# Patient Record
Sex: Female | Born: 1940
Health system: Southern US, Community
[De-identification: ages and names within clinical notes are randomized; demographics above are authoritative.]

## PROBLEM LIST (undated history)

## (undated) DIAGNOSIS — I10 Essential (primary) hypertension: Secondary | ICD-10-CM

## (undated) DIAGNOSIS — M1990 Unspecified osteoarthritis, unspecified site: Secondary | ICD-10-CM

## (undated) DIAGNOSIS — C801 Malignant (primary) neoplasm, unspecified: Secondary | ICD-10-CM

## (undated) HISTORY — DX: Essential (primary) hypertension: I10

## (undated) HISTORY — DX: Malignant (primary) neoplasm, unspecified: C80.1

## (undated) HISTORY — PX: OTHER SURGICAL HISTORY: SHX169

## (undated) HISTORY — PX: BACK SURGERY: SHX140

## (undated) HISTORY — DX: Unspecified osteoarthritis, unspecified site: M19.90

## (undated) HISTORY — PX: BREAST RECONSTRUCTION: SHX9

---

## 1998-12-01 ENCOUNTER — Other Ambulatory Visit: Admission: RE | Admit: 1998-12-01 | Discharge: 1998-12-01 | Payer: Self-pay | Admitting: Internal Medicine

## 1999-12-03 ENCOUNTER — Other Ambulatory Visit: Admission: RE | Admit: 1999-12-03 | Discharge: 1999-12-03 | Payer: Self-pay | Admitting: Internal Medicine

## 2000-10-29 ENCOUNTER — Ambulatory Visit (HOSPITAL_BASED_OUTPATIENT_CLINIC_OR_DEPARTMENT_OTHER): Admission: RE | Admit: 2000-10-29 | Discharge: 2000-10-29 | Payer: Self-pay | Admitting: Plastic Surgery

## 2000-12-02 ENCOUNTER — Other Ambulatory Visit: Admission: RE | Admit: 2000-12-02 | Discharge: 2000-12-02 | Payer: Self-pay | Admitting: Internal Medicine

## 2002-01-26 ENCOUNTER — Ambulatory Visit (HOSPITAL_COMMUNITY): Admission: RE | Admit: 2002-01-26 | Discharge: 2002-01-26 | Payer: Self-pay | Admitting: Gastroenterology

## 2010-12-29 ENCOUNTER — Other Ambulatory Visit: Payer: Self-pay | Admitting: Dermatology

## 2011-02-22 ENCOUNTER — Other Ambulatory Visit (HOSPITAL_COMMUNITY): Payer: Self-pay | Admitting: Rheumatology

## 2011-02-22 ENCOUNTER — Ambulatory Visit (HOSPITAL_COMMUNITY)
Admission: RE | Admit: 2011-02-22 | Discharge: 2011-02-22 | Disposition: A | Discharge status: Home / Self Care | Payer: Medicare Other | Source: Ambulatory Visit | Attending: Rheumatology | Admitting: Rheumatology

## 2011-02-22 DIAGNOSIS — C50919 Malignant neoplasm of unspecified site of unspecified female breast: Secondary | ICD-10-CM | POA: Insufficient documentation

## 2011-02-22 DIAGNOSIS — R52 Pain, unspecified: Secondary | ICD-10-CM

## 2011-02-22 DIAGNOSIS — Z01818 Encounter for other preprocedural examination: Secondary | ICD-10-CM | POA: Insufficient documentation

## 2011-10-16 DIAGNOSIS — Z79899 Other long term (current) drug therapy: Secondary | ICD-10-CM | POA: Diagnosis not present

## 2011-12-05 DIAGNOSIS — M069 Rheumatoid arthritis, unspecified: Secondary | ICD-10-CM | POA: Diagnosis not present

## 2011-12-05 DIAGNOSIS — M19049 Primary osteoarthritis, unspecified hand: Secondary | ICD-10-CM | POA: Diagnosis not present

## 2011-12-05 DIAGNOSIS — M25549 Pain in joints of unspecified hand: Secondary | ICD-10-CM | POA: Diagnosis not present

## 2011-12-10 DIAGNOSIS — H25019 Cortical age-related cataract, unspecified eye: Secondary | ICD-10-CM | POA: Diagnosis not present

## 2011-12-10 DIAGNOSIS — H52209 Unspecified astigmatism, unspecified eye: Secondary | ICD-10-CM | POA: Diagnosis not present

## 2011-12-10 DIAGNOSIS — Z79899 Other long term (current) drug therapy: Secondary | ICD-10-CM | POA: Diagnosis not present

## 2012-01-18 DIAGNOSIS — Z79899 Other long term (current) drug therapy: Secondary | ICD-10-CM | POA: Diagnosis not present

## 2012-01-31 DIAGNOSIS — IMO0002 Reserved for concepts with insufficient information to code with codable children: Secondary | ICD-10-CM | POA: Diagnosis not present

## 2012-01-31 DIAGNOSIS — H52209 Unspecified astigmatism, unspecified eye: Secondary | ICD-10-CM | POA: Diagnosis not present

## 2012-01-31 DIAGNOSIS — H269 Unspecified cataract: Secondary | ICD-10-CM | POA: Diagnosis not present

## 2012-01-31 DIAGNOSIS — H25019 Cortical age-related cataract, unspecified eye: Secondary | ICD-10-CM | POA: Diagnosis not present

## 2012-02-07 DIAGNOSIS — H4389 Other disorders of vitreous body: Secondary | ICD-10-CM | POA: Diagnosis not present

## 2012-02-29 DIAGNOSIS — F411 Generalized anxiety disorder: Secondary | ICD-10-CM | POA: Diagnosis not present

## 2012-03-04 DIAGNOSIS — H43829 Vitreomacular adhesion, unspecified eye: Secondary | ICD-10-CM | POA: Diagnosis not present

## 2012-03-11 DIAGNOSIS — H43829 Vitreomacular adhesion, unspecified eye: Secondary | ICD-10-CM | POA: Diagnosis not present

## 2012-03-19 DIAGNOSIS — Z79899 Other long term (current) drug therapy: Secondary | ICD-10-CM | POA: Diagnosis not present

## 2012-04-03 DIAGNOSIS — H43829 Vitreomacular adhesion, unspecified eye: Secondary | ICD-10-CM | POA: Diagnosis not present

## 2012-05-07 DIAGNOSIS — M25549 Pain in joints of unspecified hand: Secondary | ICD-10-CM | POA: Diagnosis not present

## 2012-05-07 DIAGNOSIS — M19049 Primary osteoarthritis, unspecified hand: Secondary | ICD-10-CM | POA: Diagnosis not present

## 2012-05-07 DIAGNOSIS — M069 Rheumatoid arthritis, unspecified: Secondary | ICD-10-CM | POA: Diagnosis not present

## 2012-05-07 DIAGNOSIS — M25569 Pain in unspecified knee: Secondary | ICD-10-CM | POA: Diagnosis not present

## 2012-06-25 DIAGNOSIS — Z1211 Encounter for screening for malignant neoplasm of colon: Secondary | ICD-10-CM | POA: Diagnosis not present

## 2012-08-01 DIAGNOSIS — E039 Hypothyroidism, unspecified: Secondary | ICD-10-CM | POA: Diagnosis not present

## 2012-08-01 DIAGNOSIS — M949 Disorder of cartilage, unspecified: Secondary | ICD-10-CM | POA: Diagnosis not present

## 2012-08-01 DIAGNOSIS — M899 Disorder of bone, unspecified: Secondary | ICD-10-CM | POA: Diagnosis not present

## 2012-08-01 DIAGNOSIS — E785 Hyperlipidemia, unspecified: Secondary | ICD-10-CM | POA: Diagnosis not present

## 2012-08-01 DIAGNOSIS — Z79899 Other long term (current) drug therapy: Secondary | ICD-10-CM | POA: Diagnosis not present

## 2012-08-08 DIAGNOSIS — Z124 Encounter for screening for malignant neoplasm of cervix: Secondary | ICD-10-CM | POA: Diagnosis not present

## 2012-08-08 DIAGNOSIS — Z23 Encounter for immunization: Secondary | ICD-10-CM | POA: Diagnosis not present

## 2012-08-08 DIAGNOSIS — M069 Rheumatoid arthritis, unspecified: Secondary | ICD-10-CM | POA: Diagnosis not present

## 2012-08-08 DIAGNOSIS — E039 Hypothyroidism, unspecified: Secondary | ICD-10-CM | POA: Diagnosis not present

## 2012-08-08 DIAGNOSIS — E785 Hyperlipidemia, unspecified: Secondary | ICD-10-CM | POA: Diagnosis not present

## 2012-08-08 DIAGNOSIS — Z79899 Other long term (current) drug therapy: Secondary | ICD-10-CM | POA: Diagnosis not present

## 2012-08-08 DIAGNOSIS — Z0000 Encounter for general adult medical examination without abnormal findings: Secondary | ICD-10-CM | POA: Diagnosis not present

## 2012-08-18 DIAGNOSIS — Z1212 Encounter for screening for malignant neoplasm of rectum: Secondary | ICD-10-CM | POA: Diagnosis not present

## 2012-09-01 DIAGNOSIS — H4389 Other disorders of vitreous body: Secondary | ICD-10-CM | POA: Diagnosis not present

## 2012-09-08 DIAGNOSIS — E039 Hypothyroidism, unspecified: Secondary | ICD-10-CM | POA: Diagnosis not present

## 2012-09-11 DIAGNOSIS — Z79899 Other long term (current) drug therapy: Secondary | ICD-10-CM | POA: Diagnosis not present

## 2012-10-13 DIAGNOSIS — M19049 Primary osteoarthritis, unspecified hand: Secondary | ICD-10-CM | POA: Diagnosis not present

## 2012-10-13 DIAGNOSIS — M1710 Unilateral primary osteoarthritis, unspecified knee: Secondary | ICD-10-CM | POA: Diagnosis not present

## 2012-10-13 DIAGNOSIS — M069 Rheumatoid arthritis, unspecified: Secondary | ICD-10-CM | POA: Diagnosis not present

## 2012-10-13 DIAGNOSIS — Z09 Encounter for follow-up examination after completed treatment for conditions other than malignant neoplasm: Secondary | ICD-10-CM | POA: Diagnosis not present

## 2012-12-22 DIAGNOSIS — Z79899 Other long term (current) drug therapy: Secondary | ICD-10-CM | POA: Diagnosis not present

## 2013-01-20 DIAGNOSIS — H25019 Cortical age-related cataract, unspecified eye: Secondary | ICD-10-CM | POA: Diagnosis not present

## 2013-01-20 DIAGNOSIS — H52209 Unspecified astigmatism, unspecified eye: Secondary | ICD-10-CM | POA: Diagnosis not present

## 2013-01-20 DIAGNOSIS — Z79899 Other long term (current) drug therapy: Secondary | ICD-10-CM | POA: Diagnosis not present

## 2013-02-25 DIAGNOSIS — F341 Dysthymic disorder: Secondary | ICD-10-CM | POA: Diagnosis not present

## 2013-02-25 DIAGNOSIS — IMO0002 Reserved for concepts with insufficient information to code with codable children: Secondary | ICD-10-CM | POA: Diagnosis not present

## 2013-04-27 DIAGNOSIS — F411 Generalized anxiety disorder: Secondary | ICD-10-CM | POA: Diagnosis not present

## 2013-04-29 DIAGNOSIS — Z79899 Other long term (current) drug therapy: Secondary | ICD-10-CM | POA: Diagnosis not present

## 2013-04-29 DIAGNOSIS — R5381 Other malaise: Secondary | ICD-10-CM | POA: Diagnosis not present

## 2013-05-11 DIAGNOSIS — Z09 Encounter for follow-up examination after completed treatment for conditions other than malignant neoplasm: Secondary | ICD-10-CM | POA: Diagnosis not present

## 2013-05-11 DIAGNOSIS — M1710 Unilateral primary osteoarthritis, unspecified knee: Secondary | ICD-10-CM | POA: Diagnosis not present

## 2013-05-11 DIAGNOSIS — M25579 Pain in unspecified ankle and joints of unspecified foot: Secondary | ICD-10-CM | POA: Diagnosis not present

## 2013-05-11 DIAGNOSIS — M069 Rheumatoid arthritis, unspecified: Secondary | ICD-10-CM | POA: Diagnosis not present

## 2013-06-01 DIAGNOSIS — Z1231 Encounter for screening mammogram for malignant neoplasm of breast: Secondary | ICD-10-CM | POA: Diagnosis not present

## 2013-08-07 DIAGNOSIS — E039 Hypothyroidism, unspecified: Secondary | ICD-10-CM | POA: Diagnosis not present

## 2013-08-07 DIAGNOSIS — E785 Hyperlipidemia, unspecified: Secondary | ICD-10-CM | POA: Diagnosis not present

## 2013-08-07 DIAGNOSIS — E559 Vitamin D deficiency, unspecified: Secondary | ICD-10-CM | POA: Diagnosis not present

## 2013-08-12 DIAGNOSIS — K219 Gastro-esophageal reflux disease without esophagitis: Secondary | ICD-10-CM | POA: Diagnosis not present

## 2013-08-12 DIAGNOSIS — Z0000 Encounter for general adult medical examination without abnormal findings: Secondary | ICD-10-CM | POA: Diagnosis not present

## 2013-08-12 DIAGNOSIS — F321 Major depressive disorder, single episode, moderate: Secondary | ICD-10-CM | POA: Diagnosis not present

## 2013-08-12 DIAGNOSIS — M899 Disorder of bone, unspecified: Secondary | ICD-10-CM | POA: Diagnosis not present

## 2013-08-12 DIAGNOSIS — Z1331 Encounter for screening for depression: Secondary | ICD-10-CM | POA: Diagnosis not present

## 2013-08-12 DIAGNOSIS — Z23 Encounter for immunization: Secondary | ICD-10-CM | POA: Diagnosis not present

## 2013-08-12 DIAGNOSIS — E785 Hyperlipidemia, unspecified: Secondary | ICD-10-CM | POA: Diagnosis not present

## 2013-08-12 DIAGNOSIS — E039 Hypothyroidism, unspecified: Secondary | ICD-10-CM | POA: Diagnosis not present

## 2013-08-12 DIAGNOSIS — M069 Rheumatoid arthritis, unspecified: Secondary | ICD-10-CM | POA: Diagnosis not present

## 2013-08-12 DIAGNOSIS — E559 Vitamin D deficiency, unspecified: Secondary | ICD-10-CM | POA: Diagnosis not present

## 2013-08-25 DIAGNOSIS — Z1212 Encounter for screening for malignant neoplasm of rectum: Secondary | ICD-10-CM | POA: Diagnosis not present

## 2013-08-26 DIAGNOSIS — R05 Cough: Secondary | ICD-10-CM | POA: Diagnosis not present

## 2013-08-26 DIAGNOSIS — J018 Other acute sinusitis: Secondary | ICD-10-CM | POA: Diagnosis not present

## 2013-09-02 DIAGNOSIS — M899 Disorder of bone, unspecified: Secondary | ICD-10-CM | POA: Diagnosis not present

## 2013-09-16 DIAGNOSIS — M069 Rheumatoid arthritis, unspecified: Secondary | ICD-10-CM | POA: Diagnosis not present

## 2013-09-16 DIAGNOSIS — Z79899 Other long term (current) drug therapy: Secondary | ICD-10-CM | POA: Diagnosis not present

## 2013-09-16 DIAGNOSIS — M1710 Unilateral primary osteoarthritis, unspecified knee: Secondary | ICD-10-CM | POA: Diagnosis not present

## 2013-09-16 DIAGNOSIS — Z09 Encounter for follow-up examination after completed treatment for conditions other than malignant neoplasm: Secondary | ICD-10-CM | POA: Diagnosis not present

## 2013-09-16 DIAGNOSIS — M19049 Primary osteoarthritis, unspecified hand: Secondary | ICD-10-CM | POA: Diagnosis not present

## 2013-09-16 DIAGNOSIS — M109 Gout, unspecified: Secondary | ICD-10-CM | POA: Diagnosis not present

## 2013-10-07 DIAGNOSIS — F411 Generalized anxiety disorder: Secondary | ICD-10-CM | POA: Diagnosis not present

## 2013-12-29 DIAGNOSIS — Z79899 Other long term (current) drug therapy: Secondary | ICD-10-CM | POA: Diagnosis not present

## 2014-03-10 DIAGNOSIS — H2510 Age-related nuclear cataract, unspecified eye: Secondary | ICD-10-CM | POA: Diagnosis not present

## 2014-03-10 DIAGNOSIS — L723 Sebaceous cyst: Secondary | ICD-10-CM | POA: Diagnosis not present

## 2014-03-10 DIAGNOSIS — L821 Other seborrheic keratosis: Secondary | ICD-10-CM | POA: Diagnosis not present

## 2014-03-10 DIAGNOSIS — D239 Other benign neoplasm of skin, unspecified: Secondary | ICD-10-CM | POA: Diagnosis not present

## 2014-03-10 DIAGNOSIS — Z79899 Other long term (current) drug therapy: Secondary | ICD-10-CM | POA: Diagnosis not present

## 2014-03-10 DIAGNOSIS — D2330 Other benign neoplasm of skin of unspecified part of face: Secondary | ICD-10-CM | POA: Diagnosis not present

## 2014-03-10 DIAGNOSIS — H2640 Unspecified secondary cataract: Secondary | ICD-10-CM | POA: Diagnosis not present

## 2014-03-10 DIAGNOSIS — H52209 Unspecified astigmatism, unspecified eye: Secondary | ICD-10-CM | POA: Diagnosis not present

## 2014-03-10 DIAGNOSIS — L819 Disorder of pigmentation, unspecified: Secondary | ICD-10-CM | POA: Diagnosis not present

## 2014-03-10 DIAGNOSIS — I789 Disease of capillaries, unspecified: Secondary | ICD-10-CM | POA: Diagnosis not present

## 2014-03-15 DIAGNOSIS — M1710 Unilateral primary osteoarthritis, unspecified knee: Secondary | ICD-10-CM | POA: Diagnosis not present

## 2014-03-15 DIAGNOSIS — M069 Rheumatoid arthritis, unspecified: Secondary | ICD-10-CM | POA: Diagnosis not present

## 2014-03-15 DIAGNOSIS — M19049 Primary osteoarthritis, unspecified hand: Secondary | ICD-10-CM | POA: Diagnosis not present

## 2014-03-15 DIAGNOSIS — Z09 Encounter for follow-up examination after completed treatment for conditions other than malignant neoplasm: Secondary | ICD-10-CM | POA: Diagnosis not present

## 2014-04-13 DIAGNOSIS — E559 Vitamin D deficiency, unspecified: Secondary | ICD-10-CM | POA: Diagnosis not present

## 2014-04-13 DIAGNOSIS — E039 Hypothyroidism, unspecified: Secondary | ICD-10-CM | POA: Diagnosis not present

## 2014-04-13 DIAGNOSIS — Z23 Encounter for immunization: Secondary | ICD-10-CM | POA: Diagnosis not present

## 2014-04-13 DIAGNOSIS — M899 Disorder of bone, unspecified: Secondary | ICD-10-CM | POA: Diagnosis not present

## 2014-04-13 DIAGNOSIS — E785 Hyperlipidemia, unspecified: Secondary | ICD-10-CM | POA: Diagnosis not present

## 2014-04-13 DIAGNOSIS — M069 Rheumatoid arthritis, unspecified: Secondary | ICD-10-CM | POA: Diagnosis not present

## 2014-04-13 DIAGNOSIS — K219 Gastro-esophageal reflux disease without esophagitis: Secondary | ICD-10-CM | POA: Diagnosis not present

## 2014-04-13 DIAGNOSIS — F321 Major depressive disorder, single episode, moderate: Secondary | ICD-10-CM | POA: Diagnosis not present

## 2014-04-13 DIAGNOSIS — M949 Disorder of cartilage, unspecified: Secondary | ICD-10-CM | POA: Diagnosis not present

## 2014-05-06 DIAGNOSIS — Z79899 Other long term (current) drug therapy: Secondary | ICD-10-CM | POA: Diagnosis not present

## 2014-07-19 DIAGNOSIS — Z79899 Other long term (current) drug therapy: Secondary | ICD-10-CM | POA: Diagnosis not present

## 2014-08-20 DIAGNOSIS — E785 Hyperlipidemia, unspecified: Secondary | ICD-10-CM | POA: Diagnosis not present

## 2014-08-20 DIAGNOSIS — E039 Hypothyroidism, unspecified: Secondary | ICD-10-CM | POA: Diagnosis not present

## 2014-08-20 DIAGNOSIS — E559 Vitamin D deficiency, unspecified: Secondary | ICD-10-CM | POA: Diagnosis not present

## 2014-08-20 DIAGNOSIS — M859 Disorder of bone density and structure, unspecified: Secondary | ICD-10-CM | POA: Diagnosis not present

## 2014-08-25 DIAGNOSIS — E785 Hyperlipidemia, unspecified: Secondary | ICD-10-CM | POA: Diagnosis not present

## 2014-08-25 DIAGNOSIS — Z853 Personal history of malignant neoplasm of breast: Secondary | ICD-10-CM | POA: Diagnosis not present

## 2014-08-25 DIAGNOSIS — K219 Gastro-esophageal reflux disease without esophagitis: Secondary | ICD-10-CM | POA: Diagnosis not present

## 2014-08-25 DIAGNOSIS — E559 Vitamin D deficiency, unspecified: Secondary | ICD-10-CM | POA: Diagnosis not present

## 2014-08-25 DIAGNOSIS — M8588 Other specified disorders of bone density and structure, other site: Secondary | ICD-10-CM | POA: Diagnosis not present

## 2014-08-25 DIAGNOSIS — Z1389 Encounter for screening for other disorder: Secondary | ICD-10-CM | POA: Diagnosis not present

## 2014-08-25 DIAGNOSIS — M0600 Rheumatoid arthritis without rheumatoid factor, unspecified site: Secondary | ICD-10-CM | POA: Diagnosis not present

## 2014-08-25 DIAGNOSIS — N390 Urinary tract infection, site not specified: Secondary | ICD-10-CM | POA: Diagnosis not present

## 2014-08-25 DIAGNOSIS — Z23 Encounter for immunization: Secondary | ICD-10-CM | POA: Diagnosis not present

## 2014-08-25 DIAGNOSIS — E039 Hypothyroidism, unspecified: Secondary | ICD-10-CM | POA: Diagnosis not present

## 2014-08-25 DIAGNOSIS — Z0000 Encounter for general adult medical examination without abnormal findings: Secondary | ICD-10-CM | POA: Diagnosis not present

## 2014-08-25 DIAGNOSIS — F321 Major depressive disorder, single episode, moderate: Secondary | ICD-10-CM | POA: Diagnosis not present

## 2014-10-14 DIAGNOSIS — M19041 Primary osteoarthritis, right hand: Secondary | ICD-10-CM | POA: Diagnosis not present

## 2014-10-14 DIAGNOSIS — M069 Rheumatoid arthritis, unspecified: Secondary | ICD-10-CM | POA: Diagnosis not present

## 2014-10-14 DIAGNOSIS — Z79899 Other long term (current) drug therapy: Secondary | ICD-10-CM | POA: Diagnosis not present

## 2014-10-14 DIAGNOSIS — M170 Bilateral primary osteoarthritis of knee: Secondary | ICD-10-CM | POA: Diagnosis not present

## 2014-10-18 DIAGNOSIS — F411 Generalized anxiety disorder: Secondary | ICD-10-CM | POA: Diagnosis not present

## 2014-12-01 DIAGNOSIS — H0011 Chalazion right upper eyelid: Secondary | ICD-10-CM | POA: Diagnosis not present

## 2015-02-16 DIAGNOSIS — Z79899 Other long term (current) drug therapy: Secondary | ICD-10-CM | POA: Diagnosis not present

## 2015-03-28 DIAGNOSIS — H2512 Age-related nuclear cataract, left eye: Secondary | ICD-10-CM | POA: Diagnosis not present

## 2015-03-28 DIAGNOSIS — Z79899 Other long term (current) drug therapy: Secondary | ICD-10-CM | POA: Diagnosis not present

## 2015-03-28 DIAGNOSIS — H26491 Other secondary cataract, right eye: Secondary | ICD-10-CM | POA: Diagnosis not present

## 2015-03-28 DIAGNOSIS — H35372 Puckering of macula, left eye: Secondary | ICD-10-CM | POA: Diagnosis not present

## 2015-04-08 DIAGNOSIS — M19242 Secondary osteoarthritis, left hand: Secondary | ICD-10-CM | POA: Diagnosis not present

## 2015-04-08 DIAGNOSIS — Z79899 Other long term (current) drug therapy: Secondary | ICD-10-CM | POA: Diagnosis not present

## 2015-04-08 DIAGNOSIS — M0609 Rheumatoid arthritis without rheumatoid factor, multiple sites: Secondary | ICD-10-CM | POA: Diagnosis not present

## 2015-04-08 DIAGNOSIS — M19241 Secondary osteoarthritis, right hand: Secondary | ICD-10-CM | POA: Diagnosis not present

## 2015-04-08 DIAGNOSIS — M170 Bilateral primary osteoarthritis of knee: Secondary | ICD-10-CM | POA: Diagnosis not present

## 2015-05-06 DIAGNOSIS — L570 Actinic keratosis: Secondary | ICD-10-CM | POA: Diagnosis not present

## 2015-05-06 DIAGNOSIS — L821 Other seborrheic keratosis: Secondary | ICD-10-CM | POA: Diagnosis not present

## 2015-05-06 DIAGNOSIS — L820 Inflamed seborrheic keratosis: Secondary | ICD-10-CM | POA: Diagnosis not present

## 2015-05-06 DIAGNOSIS — D225 Melanocytic nevi of trunk: Secondary | ICD-10-CM | POA: Diagnosis not present

## 2015-07-01 DIAGNOSIS — Z853 Personal history of malignant neoplasm of breast: Secondary | ICD-10-CM | POA: Diagnosis not present

## 2015-07-01 DIAGNOSIS — Z1231 Encounter for screening mammogram for malignant neoplasm of breast: Secondary | ICD-10-CM | POA: Diagnosis not present

## 2015-07-01 DIAGNOSIS — Z79899 Other long term (current) drug therapy: Secondary | ICD-10-CM | POA: Diagnosis not present

## 2015-08-31 DIAGNOSIS — M859 Disorder of bone density and structure, unspecified: Secondary | ICD-10-CM | POA: Diagnosis not present

## 2015-08-31 DIAGNOSIS — E038 Other specified hypothyroidism: Secondary | ICD-10-CM | POA: Diagnosis not present

## 2015-08-31 DIAGNOSIS — E7800 Pure hypercholesterolemia, unspecified: Secondary | ICD-10-CM | POA: Diagnosis not present

## 2015-09-06 DIAGNOSIS — Z853 Personal history of malignant neoplasm of breast: Secondary | ICD-10-CM | POA: Diagnosis not present

## 2015-09-06 DIAGNOSIS — Z23 Encounter for immunization: Secondary | ICD-10-CM | POA: Diagnosis not present

## 2015-09-06 DIAGNOSIS — E7800 Pure hypercholesterolemia, unspecified: Secondary | ICD-10-CM | POA: Diagnosis not present

## 2015-09-06 DIAGNOSIS — M1612 Unilateral primary osteoarthritis, left hip: Secondary | ICD-10-CM | POA: Diagnosis not present

## 2015-09-06 DIAGNOSIS — M0600 Rheumatoid arthritis without rheumatoid factor, unspecified site: Secondary | ICD-10-CM | POA: Diagnosis not present

## 2015-09-06 DIAGNOSIS — Z0000 Encounter for general adult medical examination without abnormal findings: Secondary | ICD-10-CM | POA: Diagnosis not present

## 2015-09-06 DIAGNOSIS — Z1389 Encounter for screening for other disorder: Secondary | ICD-10-CM | POA: Diagnosis not present

## 2015-09-06 DIAGNOSIS — M859 Disorder of bone density and structure, unspecified: Secondary | ICD-10-CM | POA: Diagnosis not present

## 2015-09-06 DIAGNOSIS — Z79899 Other long term (current) drug therapy: Secondary | ICD-10-CM | POA: Diagnosis not present

## 2015-09-06 DIAGNOSIS — K219 Gastro-esophageal reflux disease without esophagitis: Secondary | ICD-10-CM | POA: Diagnosis not present

## 2015-09-06 DIAGNOSIS — E038 Other specified hypothyroidism: Secondary | ICD-10-CM | POA: Diagnosis not present

## 2015-09-06 DIAGNOSIS — F321 Major depressive disorder, single episode, moderate: Secondary | ICD-10-CM | POA: Diagnosis not present

## 2015-09-06 DIAGNOSIS — Z6820 Body mass index (BMI) 20.0-20.9, adult: Secondary | ICD-10-CM | POA: Diagnosis not present

## 2015-09-06 DIAGNOSIS — E559 Vitamin D deficiency, unspecified: Secondary | ICD-10-CM | POA: Diagnosis not present

## 2015-09-27 DIAGNOSIS — H35372 Puckering of macula, left eye: Secondary | ICD-10-CM | POA: Diagnosis not present

## 2015-10-04 DIAGNOSIS — Z79899 Other long term (current) drug therapy: Secondary | ICD-10-CM | POA: Diagnosis not present

## 2015-10-10 DIAGNOSIS — Z09 Encounter for follow-up examination after completed treatment for conditions other than malignant neoplasm: Secondary | ICD-10-CM | POA: Diagnosis not present

## 2015-10-10 DIAGNOSIS — G5702 Lesion of sciatic nerve, left lower limb: Secondary | ICD-10-CM | POA: Diagnosis not present

## 2015-10-10 DIAGNOSIS — M0579 Rheumatoid arthritis with rheumatoid factor of multiple sites without organ or systems involvement: Secondary | ICD-10-CM | POA: Diagnosis not present

## 2015-10-10 DIAGNOSIS — M170 Bilateral primary osteoarthritis of knee: Secondary | ICD-10-CM | POA: Diagnosis not present

## 2015-10-18 DIAGNOSIS — F411 Generalized anxiety disorder: Secondary | ICD-10-CM | POA: Diagnosis not present

## 2015-10-18 DIAGNOSIS — H43811 Vitreous degeneration, right eye: Secondary | ICD-10-CM | POA: Diagnosis not present

## 2015-10-18 DIAGNOSIS — H35342 Macular cyst, hole, or pseudohole, left eye: Secondary | ICD-10-CM | POA: Diagnosis not present

## 2015-11-21 DIAGNOSIS — S0990XA Unspecified injury of head, initial encounter: Secondary | ICD-10-CM | POA: Diagnosis not present

## 2015-11-21 DIAGNOSIS — M069 Rheumatoid arthritis, unspecified: Secondary | ICD-10-CM | POA: Diagnosis not present

## 2015-11-21 DIAGNOSIS — R51 Headache: Secondary | ICD-10-CM | POA: Diagnosis not present

## 2016-01-05 DIAGNOSIS — H35342 Macular cyst, hole, or pseudohole, left eye: Secondary | ICD-10-CM | POA: Diagnosis not present

## 2016-01-06 DIAGNOSIS — Z79899 Other long term (current) drug therapy: Secondary | ICD-10-CM | POA: Diagnosis not present

## 2016-03-15 DIAGNOSIS — M0579 Rheumatoid arthritis with rheumatoid factor of multiple sites without organ or systems involvement: Secondary | ICD-10-CM | POA: Diagnosis not present

## 2016-03-15 DIAGNOSIS — Z09 Encounter for follow-up examination after completed treatment for conditions other than malignant neoplasm: Secondary | ICD-10-CM | POA: Diagnosis not present

## 2016-03-15 DIAGNOSIS — M19241 Secondary osteoarthritis, right hand: Secondary | ICD-10-CM | POA: Diagnosis not present

## 2016-03-15 DIAGNOSIS — M79641 Pain in right hand: Secondary | ICD-10-CM | POA: Diagnosis not present

## 2016-04-06 DIAGNOSIS — H2512 Age-related nuclear cataract, left eye: Secondary | ICD-10-CM | POA: Diagnosis not present

## 2016-04-06 DIAGNOSIS — H52203 Unspecified astigmatism, bilateral: Secondary | ICD-10-CM | POA: Diagnosis not present

## 2016-04-06 DIAGNOSIS — Z79899 Other long term (current) drug therapy: Secondary | ICD-10-CM | POA: Diagnosis not present

## 2016-04-06 DIAGNOSIS — H35342 Macular cyst, hole, or pseudohole, left eye: Secondary | ICD-10-CM | POA: Diagnosis not present

## 2016-05-16 DIAGNOSIS — H43823 Vitreomacular adhesion, bilateral: Secondary | ICD-10-CM | POA: Diagnosis not present

## 2016-05-16 DIAGNOSIS — H35371 Puckering of macula, right eye: Secondary | ICD-10-CM | POA: Diagnosis not present

## 2016-05-16 DIAGNOSIS — Z79899 Other long term (current) drug therapy: Secondary | ICD-10-CM | POA: Diagnosis not present

## 2016-07-27 ENCOUNTER — Other Ambulatory Visit: Payer: Self-pay | Admitting: Rheumatology

## 2016-07-30 ENCOUNTER — Telehealth: Payer: Self-pay | Admitting: Radiology

## 2016-07-30 DIAGNOSIS — Z79899 Other long term (current) drug therapy: Secondary | ICD-10-CM

## 2016-07-30 DIAGNOSIS — Z79631 Long term (current) use of antimetabolite agent: Secondary | ICD-10-CM

## 2016-07-30 NOTE — Telephone Encounter (Signed)
Last visit 03/15/16 Next visit 10/04/16 Labs WNL 01/06/16 these are very past due Will call pt to advise   Unable to refill MTX

## 2016-07-30 NOTE — Telephone Encounter (Signed)
I have called patient to advise. Left message, lab orders placed

## 2016-08-02 ENCOUNTER — Other Ambulatory Visit: Payer: Self-pay | Admitting: Radiology

## 2016-08-02 ENCOUNTER — Telehealth: Payer: Self-pay | Admitting: Rheumatology

## 2016-08-02 DIAGNOSIS — Z79631 Long term (current) use of antimetabolite agent: Secondary | ICD-10-CM

## 2016-08-02 DIAGNOSIS — Z79899 Other long term (current) drug therapy: Secondary | ICD-10-CM

## 2016-08-02 LAB — COMPLETE METABOLIC PANEL WITH GFR
ALT: 17 U/L (ref 6–29)
AST: 22 U/L (ref 10–35)
Albumin: 4.2 g/dL (ref 3.6–5.1)
Alkaline Phosphatase: 55 U/L (ref 33–130)
BILIRUBIN TOTAL: 0.4 mg/dL (ref 0.2–1.2)
BUN: 10 mg/dL (ref 7–25)
CALCIUM: 9.2 mg/dL (ref 8.6–10.4)
CHLORIDE: 103 mmol/L (ref 98–110)
CO2: 28 mmol/L (ref 20–31)
CREATININE: 0.78 mg/dL (ref 0.60–0.93)
GFR, EST AFRICAN AMERICAN: 85 mL/min (ref >=60)
GFR, EST NON AFRICAN AMERICAN: 74 mL/min (ref >=60)
Glucose, Bld: 134 mg/dL — ABNORMAL HIGH (ref 65–99)
Potassium: 4.3 mmol/L (ref 3.5–5.3)
Sodium: 140 mmol/L (ref 135–146)
Total Protein: 7 g/dL (ref 6.1–8.1)

## 2016-08-02 LAB — CBC WITH DIFFERENTIAL/PLATELET
BASOS ABS: 0 {cells}/uL (ref 0–200)
Basophils Relative: 0 %
EOS ABS: 60 {cells}/uL (ref 15–500)
Eosinophils Relative: 1 %
HEMATOCRIT: 41.7 % (ref 35.0–45.0)
Hemoglobin: 13.6 g/dL (ref 11.7–15.5)
LYMPHS PCT: 14 %
Lymphs Abs: 840 cells/uL — ABNORMAL LOW (ref 850–3900)
MCH: 33.1 pg — AB (ref 27.0–33.0)
MCHC: 32.6 g/dL (ref 32.0–36.0)
MCV: 101.5 fL — AB (ref 80.0–100.0)
MONO ABS: 360 {cells}/uL (ref 200–950)
MONOS PCT: 6 %
MPV: 9.8 fL (ref 7.5–12.5)
NEUTROS PCT: 79 %
Neutro Abs: 4740 cells/uL (ref 1500–7800)
PLATELETS: 217 10*3/uL (ref 140–400)
RBC: 4.11 MIL/uL (ref 3.80–5.10)
RDW: 13.5 % (ref 11.0–15.0)
WBC: 6 10*3/uL (ref 3.8–10.8)

## 2016-08-02 NOTE — Telephone Encounter (Signed)
Naki w/[facility] needs lab orders entered in. Cb#[phone]

## 2016-08-02 NOTE — Telephone Encounter (Signed)
Thank you , orders released

## 2016-08-03 NOTE — Progress Notes (Signed)
Labs normal.

## 2016-08-06 ENCOUNTER — Other Ambulatory Visit: Payer: Self-pay | Admitting: Rheumatology

## 2016-08-07 NOTE — Telephone Encounter (Signed)
Last visit 03/15/16 Next visit 10/04/16 Labs WNL 08/02/16

## 2016-08-22 DIAGNOSIS — H35371 Puckering of macula, right eye: Secondary | ICD-10-CM | POA: Diagnosis not present

## 2016-08-22 DIAGNOSIS — H43822 Vitreomacular adhesion, left eye: Secondary | ICD-10-CM | POA: Diagnosis not present

## 2016-09-19 DIAGNOSIS — M859 Disorder of bone density and structure, unspecified: Secondary | ICD-10-CM | POA: Diagnosis not present

## 2016-09-19 DIAGNOSIS — R358 Other polyuria: Secondary | ICD-10-CM | POA: Diagnosis not present

## 2016-09-19 DIAGNOSIS — E7800 Pure hypercholesterolemia, unspecified: Secondary | ICD-10-CM | POA: Diagnosis not present

## 2016-09-19 DIAGNOSIS — R8299 Other abnormal findings in urine: Secondary | ICD-10-CM | POA: Diagnosis not present

## 2016-09-19 DIAGNOSIS — E038 Other specified hypothyroidism: Secondary | ICD-10-CM | POA: Diagnosis not present

## 2016-09-24 ENCOUNTER — Ambulatory Visit: Payer: Self-pay | Admitting: Rheumatology

## 2016-09-26 DIAGNOSIS — Z79899 Other long term (current) drug therapy: Secondary | ICD-10-CM | POA: Diagnosis not present

## 2016-09-26 DIAGNOSIS — Z23 Encounter for immunization: Secondary | ICD-10-CM | POA: Diagnosis not present

## 2016-09-26 DIAGNOSIS — M0600 Rheumatoid arthritis without rheumatoid factor, unspecified site: Secondary | ICD-10-CM | POA: Diagnosis not present

## 2016-09-26 DIAGNOSIS — Z853 Personal history of malignant neoplasm of breast: Secondary | ICD-10-CM | POA: Diagnosis not present

## 2016-09-26 DIAGNOSIS — R413 Other amnesia: Secondary | ICD-10-CM | POA: Diagnosis not present

## 2016-09-26 DIAGNOSIS — E038 Other specified hypothyroidism: Secondary | ICD-10-CM | POA: Diagnosis not present

## 2016-09-26 DIAGNOSIS — E7800 Pure hypercholesterolemia, unspecified: Secondary | ICD-10-CM | POA: Diagnosis not present

## 2016-09-26 DIAGNOSIS — Z0000 Encounter for general adult medical examination without abnormal findings: Secondary | ICD-10-CM | POA: Diagnosis not present

## 2016-09-26 DIAGNOSIS — F3341 Major depressive disorder, recurrent, in partial remission: Secondary | ICD-10-CM | POA: Diagnosis not present

## 2016-09-26 DIAGNOSIS — Z6820 Body mass index (BMI) 20.0-20.9, adult: Secondary | ICD-10-CM | POA: Diagnosis not present

## 2016-09-26 DIAGNOSIS — K219 Gastro-esophageal reflux disease without esophagitis: Secondary | ICD-10-CM | POA: Diagnosis not present

## 2016-09-26 DIAGNOSIS — Z1389 Encounter for screening for other disorder: Secondary | ICD-10-CM | POA: Diagnosis not present

## 2016-10-02 ENCOUNTER — Ambulatory Visit (INDEPENDENT_AMBULATORY_CARE_PROVIDER_SITE_OTHER): Payer: Medicare Other | Admitting: Rheumatology

## 2016-10-02 ENCOUNTER — Encounter: Payer: Self-pay | Admitting: Rheumatology

## 2016-10-02 VITALS — BP 126/74 | HR 72 | Resp 16 | Ht 63.0 in | Wt 131.0 lb

## 2016-10-02 DIAGNOSIS — M19041 Primary osteoarthritis, right hand: Secondary | ICD-10-CM | POA: Diagnosis not present

## 2016-10-02 DIAGNOSIS — M19072 Primary osteoarthritis, left ankle and foot: Secondary | ICD-10-CM

## 2016-10-02 DIAGNOSIS — M19071 Primary osteoarthritis, right ankle and foot: Secondary | ICD-10-CM

## 2016-10-02 DIAGNOSIS — Z79899 Other long term (current) drug therapy: Secondary | ICD-10-CM

## 2016-10-02 DIAGNOSIS — M0609 Rheumatoid arthritis without rheumatoid factor, multiple sites: Secondary | ICD-10-CM | POA: Insufficient documentation

## 2016-10-02 DIAGNOSIS — M19042 Primary osteoarthritis, left hand: Secondary | ICD-10-CM

## 2016-10-02 NOTE — Progress Notes (Signed)
Office Visit Note  Patient: Megan Porter             Date of Birth: 03/04/1941           MRN: [id number]             PCP: Haywood Pao, MD Referring: No ref. provider found Visit Date: 10/02/2016 Occupation: '@GUAROCC'$ @    Subjective:  Follow-up History of rheumatoid arthritis, high risk prescription, OA of hands and feet  History of Present Illness: Megan Porter is a 76 y.o. female  Last seen 03/15/2016 Patient has seronegative rheumatoid arthritis which is doing well overall with no joint pain swelling and stiffness except for flaring drink Christmas. Her right ankle joint and right hand had mild swelling overnight and then resolve. Patient attributes this flare to increase activity. They resolved on their own.  Taking methotrexate 4 pills per week and folic acid 1 mg daily and Plaquenil 200 mg daily with good results.  Gets Plaquenil eye exam through Dr. Lyda Jester office July 2017 and was normal.  Has history of OA of the hands and feet.   Activities of Daily Living:  Patient reports morning stiffness for 15 minutes.   Patient Denies nocturnal pain.  Difficulty dressing/grooming: Denies Difficulty climbing stairs: Denies Difficulty getting out of chair: Denies Difficulty using hands for taps, buttons, cutlery, and/or writing: Reports   Review of Systems  Constitutional: Negative for fatigue.  HENT: Negative for mouth sores and mouth dryness.   Eyes: Negative for dryness.  Respiratory: Negative for shortness of breath.   Gastrointestinal: Negative for constipation and diarrhea.  Musculoskeletal: Negative for myalgias and myalgias.  Skin: Negative for sensitivity to sunlight.  Psychiatric/Behavioral: Negative for decreased concentration and sleep disturbance.    PMFS History:  Patient Active Problem List   Diagnosis Date Noted  . Primary osteoarthritis of both feet 10/02/2016  . Primary osteoarthritis of both hands 10/02/2016  . High risk medications (not  anticoagulants) long-term use 10/02/2016  . Rheumatoid arthritis, seronegative, multiple sites ([place]) 10/02/2016    No past medical history on file.  No family history on file. No past surgical history on file. Social History   Social History Narrative  . No narrative on file     Objective: Vital Signs: BP 126/74   Pulse 72   Resp 16   Ht '5\' 3"'$  (1.6 m)   Wt 131 lb (59.4 kg)   BMI 23.21 kg/m    Physical Exam  Constitutional: She is oriented to person, place, and time. She appears well-developed and well-nourished.  HENT:  Head: Normocephalic and atraumatic.  Eyes: EOM are normal. Pupils are equal, round, and reactive to light.  Cardiovascular: Normal rate, regular rhythm and normal heart sounds.  Exam reveals no gallop and no friction rub.   No murmur heard. Pulmonary/Chest: Effort normal and breath sounds normal. She has no wheezes. She has no rales.  Abdominal: Soft. Bowel sounds are normal. She exhibits no distension. There is no tenderness. There is no guarding. No hernia.  Musculoskeletal: Normal range of motion. She exhibits no edema, tenderness or deformity.  Lymphadenopathy:    She has no cervical adenopathy.  Neurological: She is alert and oriented to person, place, and time. Coordination normal.  Skin: Skin is warm and dry. Capillary refill takes less than 2 seconds. No rash noted.  Psychiatric: She has a normal mood and affect. Her behavior is normal.  Nursing note and vitals reviewed.    Musculoskeletal Exam:  Full range of  motion of all joints Grip strength is equal and strong bilaterally Fiber myalgia tender points are all absent  CDAI Exam: CDAI Homunculus Exam:   Joint Counts:  CDAI Tender Joint count: 0 CDAI Swollen Joint count: 0  No synovitis on examination DIP PIP prominence bilaterally with ongoing mild discomfort off and on. Especially painful is right greater than left CMC joint   Investigation: No additional findings.  Orders Only on  08/02/2016  Component Date Value Ref Range Status  . WBC 08/02/2016 6.0  3.8 - 10.8 K/uL Final  . RBC 08/02/2016 4.11  3.80 - 5.10 MIL/uL Final  . Hemoglobin 08/02/2016 13.6  11.7 - 15.5 g/dL Final  . HCT 08/02/2016 41.7  35.0 - 45.0 % Final  . MCV 08/02/2016 101.5* 80.0 - 100.0 fL Final  . MCH 08/02/2016 33.1* 27.0 - 33.0 pg Final  . MCHC 08/02/2016 32.6  32.0 - 36.0 g/dL Final  . RDW 08/02/2016 13.5  11.0 - 15.0 % Final  . Platelets 08/02/2016 217  140 - 400 K/uL Final  . MPV 08/02/2016 9.8  7.5 - 12.5 fL Final  . Neutro Abs 08/02/2016 4740  1,500 - 7,800 cells/uL Final  . Lymphs Abs 08/02/2016 840* 850 - 3,900 cells/uL Final  . Monocytes Absolute 08/02/2016 360  200 - 950 cells/uL Final  . Eosinophils Absolute 08/02/2016 60  15 - 500 cells/uL Final  . Basophils Absolute 08/02/2016 0  0 - 200 cells/uL Final  . Neutrophils Relative % 08/02/2016 79  % Final  . Lymphocytes Relative 08/02/2016 14  % Final  . Monocytes Relative 08/02/2016 6  % Final  . Eosinophils Relative 08/02/2016 1  % Final  . Basophils Relative 08/02/2016 0  % Final  . Smear Review 08/02/2016 Criteria for review not met   Final  . Sodium 08/02/2016 140  135 - 146 mmol/L Final  . Potassium 08/02/2016 4.3  3.5 - 5.3 mmol/L Final  . Chloride 08/02/2016 103  98 - 110 mmol/L Final  . CO2 08/02/2016 28  20 - 31 mmol/L Final  . Glucose, Bld 08/02/2016 134* 65 - 99 mg/dL Final  . BUN 08/02/2016 10  7 - 25 mg/dL Final  . Creat 08/02/2016 0.78  0.60 - 0.93 mg/dL Final   Comment:   For patients > or = 78 years of age: The upper reference limit for Creatinine is approximately 13% higher for people identified as African-American.     . Total Bilirubin 08/02/2016 0.4  0.2 - 1.2 mg/dL Final  . Alkaline Phosphatase 08/02/2016 55  33 - 130 U/L Final  . AST 08/02/2016 22  10 - 35 U/L Final  . ALT 08/02/2016 17  6 - 29 U/L Final  . Total Protein 08/02/2016 7.0  6.1 - 8.1 g/dL Final  . Albumin 08/02/2016 4.2  3.6 - 5.1 g/dL  Final  . Calcium 08/02/2016 9.2  8.6 - 10.4 mg/dL Final  . GFR, Est African American 08/02/2016 85  >=60 mL/min Final  . GFR, Est Non African American 08/02/2016 74  >=60 mL/min Final     Imaging: No results found.  Speciality Comments: No specialty comments available.    Procedures:  No procedures performed Allergies: Patient has no known allergies.   Assessment / Plan:     Visit Diagnoses: Rheumatoid arthritis, seronegative, multiple sites ([place]) - no flare  High risk medications (not anticoagulants) long-term use - mtx 4/wk; folic '1mg'$  qd; plq 200qd; plq eye exam july 2017,dr mcquen, normal;   Primary osteoarthritis  of both hands - Generally 16, 2018: Ongoing DIP PIP prominence with mild pain to [facility] joints bilaterally, right worse than left  Primary osteoarthritis of both feet   Plan: #1: Rheumatoid arthritis, doing well. No joint pain swelling and stiffness in general but did have a flare during Christmas which affected her right ankle joint and right hand. Had mild swelling overnight then resolved on its own. Patient attributes this to her elevated activity during that time of the year.  #2: High-risk prescription. Doing well with methotrexate 4 per week, folic acid 1 mg daily, Plaquenil 20 mg daily. Note that in the past she used to take Plaquenil 200 in the morning and 100 daily which we switched to 200 mg daily and she is getting adequate response to this lower dose. Her Plaquenil eye exam will be due July 2018. The last one was July 2017 at Dr. Benna Dunks office and was everything was normal  #3: OA of hands and feet. Doing well. No complaint.  Patient does not require any medication refill. He will call us when she needs it.  Patient recently had labs done and of November 2017. She also had a full physical exam about 3 weeks ago at Dr. Loren Racer office. And patient states the labs were normal  #4: Return to clinic in 5 months  #5: Adequate response to methotrexate 4 per  week, folic acid 1 mg daily, Plaquenil 200 mg daily. Patient is due for Plaquenil eye exam July 2018 at Dr. Linward Natal office  Orders: No orders of the defined types were placed in this encounter.  No orders of the defined types were placed in this encounter.   Face-to-face time spent with patient was 30 minutes. 50% of time was spent in counseling and coordination of care.  Follow-Up Instructions: Return in about 5 months (around 03/02/2017) for RA, mtx 4/wk, folic 3m qd, plq 2283qd, oa hands, oa feet.   NEliezer Lofts PA-C  Note - This record has been created using [employer]  Chart creation errors have been sought, but may not always  have been located. Such creation errors do not reflect on  the standard of medical care.

## 2016-10-08 DIAGNOSIS — Z1231 Encounter for screening mammogram for malignant neoplasm of breast: Secondary | ICD-10-CM | POA: Diagnosis not present

## 2016-10-08 DIAGNOSIS — Z803 Family history of malignant neoplasm of breast: Secondary | ICD-10-CM | POA: Diagnosis not present

## 2016-10-16 DIAGNOSIS — F411 Generalized anxiety disorder: Secondary | ICD-10-CM | POA: Diagnosis not present

## 2016-10-22 ENCOUNTER — Other Ambulatory Visit: Payer: Self-pay | Admitting: Rheumatology

## 2016-10-22 NOTE — Telephone Encounter (Signed)
Last Visit: 10/02/16 Next Visit: 03/05/17 Labs: 08/02/16 WNL  Okay to refill MTX?

## 2016-11-28 DIAGNOSIS — Z79899 Other long term (current) drug therapy: Secondary | ICD-10-CM | POA: Diagnosis not present

## 2016-11-28 DIAGNOSIS — H43823 Vitreomacular adhesion, bilateral: Secondary | ICD-10-CM | POA: Diagnosis not present

## 2016-11-28 DIAGNOSIS — H43822 Vitreomacular adhesion, left eye: Secondary | ICD-10-CM | POA: Diagnosis not present

## 2016-11-28 DIAGNOSIS — H35371 Puckering of macula, right eye: Secondary | ICD-10-CM | POA: Diagnosis not present

## 2016-12-16 DIAGNOSIS — E039 Hypothyroidism, unspecified: Secondary | ICD-10-CM | POA: Diagnosis not present

## 2016-12-16 DIAGNOSIS — R509 Fever, unspecified: Secondary | ICD-10-CM | POA: Diagnosis not present

## 2016-12-16 DIAGNOSIS — J90 Pleural effusion, not elsewhere classified: Secondary | ICD-10-CM | POA: Diagnosis not present

## 2016-12-16 DIAGNOSIS — M1990 Unspecified osteoarthritis, unspecified site: Secondary | ICD-10-CM | POA: Diagnosis not present

## 2016-12-16 DIAGNOSIS — Z9010 Acquired absence of unspecified breast and nipple: Secondary | ICD-10-CM | POA: Diagnosis not present

## 2016-12-16 DIAGNOSIS — Z9889 Other specified postprocedural states: Secondary | ICD-10-CM | POA: Diagnosis not present

## 2016-12-16 DIAGNOSIS — R05 Cough: Secondary | ICD-10-CM | POA: Diagnosis not present

## 2016-12-16 DIAGNOSIS — J189 Pneumonia, unspecified organism: Secondary | ICD-10-CM | POA: Diagnosis not present

## 2016-12-16 DIAGNOSIS — Z853 Personal history of malignant neoplasm of breast: Secondary | ICD-10-CM | POA: Diagnosis not present

## 2016-12-16 DIAGNOSIS — A419 Sepsis, unspecified organism: Secondary | ICD-10-CM | POA: Diagnosis not present

## 2016-12-16 DIAGNOSIS — R079 Chest pain, unspecified: Secondary | ICD-10-CM | POA: Diagnosis not present

## 2016-12-16 DIAGNOSIS — C50912 Malignant neoplasm of unspecified site of left female breast: Secondary | ICD-10-CM | POA: Diagnosis not present

## 2016-12-16 DIAGNOSIS — N390 Urinary tract infection, site not specified: Secondary | ICD-10-CM | POA: Diagnosis not present

## 2016-12-22 DIAGNOSIS — J189 Pneumonia, unspecified organism: Secondary | ICD-10-CM | POA: Diagnosis not present

## 2016-12-22 DIAGNOSIS — J984 Other disorders of lung: Secondary | ICD-10-CM | POA: Diagnosis not present

## 2016-12-22 DIAGNOSIS — T7840XA Allergy, unspecified, initial encounter: Secondary | ICD-10-CM | POA: Diagnosis not present

## 2016-12-28 DIAGNOSIS — Z681 Body mass index (BMI) 19 or less, adult: Secondary | ICD-10-CM | POA: Diagnosis not present

## 2016-12-28 DIAGNOSIS — J189 Pneumonia, unspecified organism: Secondary | ICD-10-CM | POA: Diagnosis not present

## 2017-01-21 ENCOUNTER — Other Ambulatory Visit: Payer: Self-pay | Admitting: Rheumatology

## 2017-01-22 NOTE — Telephone Encounter (Signed)
ok 

## 2017-01-22 NOTE — Telephone Encounter (Signed)
Last Visit: 10/02/16 Next Visit: 03/05/17 Labs: 08/02/16 WNL  Left message to advise patient she needs to update labs and to call back with lab plan.  Okay to refill 30 supply MTX?

## 2017-02-05 ENCOUNTER — Other Ambulatory Visit: Payer: Self-pay

## 2017-02-05 DIAGNOSIS — Z79631 Long term (current) use of antimetabolite agent: Secondary | ICD-10-CM

## 2017-02-05 DIAGNOSIS — Z79899 Other long term (current) drug therapy: Secondary | ICD-10-CM

## 2017-02-05 LAB — CBC WITH DIFFERENTIAL/PLATELET
BASOS ABS: 49 {cells}/uL (ref 0–200)
Basophils Relative: 1 %
EOS ABS: 98 {cells}/uL (ref 15–500)
Eosinophils Relative: 2 %
HCT: 37.4 % (ref 35.0–45.0)
Hemoglobin: 12.4 g/dL (ref 11.7–15.5)
LYMPHS ABS: 1029 {cells}/uL (ref 850–3900)
Lymphocytes Relative: 21 %
MCH: 32.5 pg (ref 27.0–33.0)
MCHC: 33.2 g/dL (ref 32.0–36.0)
MCV: 98.2 fL (ref 80.0–100.0)
MPV: 9.5 fL (ref 7.5–12.5)
Monocytes Absolute: 392 cells/uL (ref 200–950)
Monocytes Relative: 8 %
NEUTROS ABS: 3332 {cells}/uL (ref 1500–7800)
Neutrophils Relative %: 68 %
Platelets: 222 10*3/uL (ref 140–400)
RBC: 3.81 MIL/uL (ref 3.80–5.10)
RDW: 14.3 % (ref 11.0–15.0)
WBC: 4.9 10*3/uL (ref 3.8–10.8)

## 2017-02-06 LAB — COMPLETE METABOLIC PANEL WITH GFR
ALBUMIN: 3.8 g/dL (ref 3.6–5.1)
ALK PHOS: 62 U/L (ref 33–130)
ALT: 13 U/L (ref 6–29)
AST: 17 U/L (ref 10–35)
BUN: 15 mg/dL (ref 7–25)
CO2: 26 mmol/L (ref 20–31)
Calcium: 8.9 mg/dL (ref 8.6–10.4)
Chloride: 105 mmol/L (ref 98–110)
Creat: 0.82 mg/dL (ref 0.60–0.93)
GFR, Est African American: 79 mL/min (ref >=60)
GFR, Est Non African American: 69 mL/min (ref >=60)
Glucose, Bld: 87 mg/dL (ref 65–99)
POTASSIUM: 4.2 mmol/L (ref 3.5–5.3)
SODIUM: 142 mmol/L (ref 135–146)
Total Bilirubin: 0.4 mg/dL (ref 0.2–1.2)
Total Protein: 6.3 g/dL (ref 6.1–8.1)

## 2017-02-06 NOTE — Progress Notes (Signed)
WNL

## 2017-03-05 ENCOUNTER — Ambulatory Visit: Payer: Medicare Other | Admitting: Rheumatology

## 2017-03-13 ENCOUNTER — Other Ambulatory Visit: Payer: Self-pay | Admitting: Rheumatology

## 2017-03-13 ENCOUNTER — Telehealth: Payer: Self-pay | Admitting: Rheumatology

## 2017-03-13 DIAGNOSIS — M7662 Achilles tendinitis, left leg: Secondary | ICD-10-CM | POA: Diagnosis not present

## 2017-03-13 NOTE — Telephone Encounter (Signed)
Patient is out of MTX. Needs new rx called into [facility]. Please call patient when sent in.

## 2017-03-13 NOTE — Telephone Encounter (Signed)
Patient returned your phone call.

## 2017-03-13 NOTE — Telephone Encounter (Signed)
Last Visit: 10/02/16 Next Visit:   04/09/17 Labs: 02/05/17 WNL  Okay to refill per Dr. Estanislado Pandy

## 2017-03-13 NOTE — Telephone Encounter (Signed)
Left message to advise patient prescription sent to the pharmacy.  °

## 2017-04-01 DIAGNOSIS — R300 Dysuria: Secondary | ICD-10-CM | POA: Diagnosis not present

## 2017-04-01 DIAGNOSIS — R358 Other polyuria: Secondary | ICD-10-CM | POA: Diagnosis not present

## 2017-04-02 ENCOUNTER — Other Ambulatory Visit: Payer: Self-pay | Admitting: Internal Medicine

## 2017-04-02 DIAGNOSIS — J189 Pneumonia, unspecified organism: Secondary | ICD-10-CM

## 2017-04-02 DIAGNOSIS — M7662 Achilles tendinitis, left leg: Secondary | ICD-10-CM | POA: Diagnosis not present

## 2017-04-03 DIAGNOSIS — H43823 Vitreomacular adhesion, bilateral: Secondary | ICD-10-CM | POA: Diagnosis not present

## 2017-04-03 DIAGNOSIS — H35371 Puckering of macula, right eye: Secondary | ICD-10-CM | POA: Diagnosis not present

## 2017-04-04 DIAGNOSIS — Z853 Personal history of malignant neoplasm of breast: Secondary | ICD-10-CM | POA: Insufficient documentation

## 2017-04-04 NOTE — Progress Notes (Signed)
Office Visit Note  Patient: Megan Porter             Date of Birth: [birth year]           MRN: [id number]             PCP: Haywood Pao, MD Referring: Haywood Pao, MD Visit Date: 04/09/2017 Occupation: @GUAROCC @    Subjective:  Follow-up (follow up for ra & oa in hands and feet), Joint stiffness   History of Present Illness: Megan Porter is a 76 y.o. female with history of rheumatoid arthritis. She states she's been tolerating her medications well. Her symptoms are quite well-controlled. She reports intermittent swelling in her hands and ankle joints.. She continues to have some stiffness in her hands knee joints and bilateral feet due to underlying osteoarthritis. The lower back pain comes and goes and related to activities.   Activities of Daily Living:  Patient reports morning stiffness for 30 minutes.   Patient Denies nocturnal pain.  Difficulty dressing/grooming: Denies Difficulty climbing stairs: Denies Difficulty getting out of chair: Reports Difficulty using hands for taps, buttons, cutlery, and/or writing: Denies   Review of Systems  Constitutional: Negative for fatigue, night sweats, weight gain, weight loss and weakness.  HENT: Negative for mouth sores, trouble swallowing, trouble swallowing, mouth dryness and nose dryness.   Eyes: Negative for pain, redness, visual disturbance and dryness.  Respiratory: Negative for cough, shortness of breath and difficulty breathing.   Cardiovascular: Negative for chest pain, palpitations, hypertension, irregular heartbeat and swelling in legs/feet.  Gastrointestinal: Positive for constipation. Negative for blood in stool and diarrhea.  Endocrine: Negative for increased urination.  Genitourinary: Negative for vaginal dryness.  Musculoskeletal: Positive for arthralgias, joint pain and morning stiffness. Negative for joint swelling, myalgias, muscle weakness, muscle tenderness and myalgias.  Skin: Negative for color  change, rash, hair loss, skin tightness, ulcers and sensitivity to sunlight.  Allergic/Immunologic: Negative for susceptible to infections.  Neurological: Negative for dizziness, memory loss and night sweats.  Hematological: Negative for swollen glands.  Psychiatric/Behavioral: Negative for depressed mood and sleep disturbance. The patient is not nervous/anxious.     PMFS History:  Patient Active Problem List   Diagnosis Date Noted  . Primary osteoarthritis of both knees 04/06/2017  . DDD (degenerative disc disease), lumbar history of lumbar surgery 1970 04/06/2017  . Age-related osteoporosis without current pathological fracture 04/06/2017  . History of anxiety 04/06/2017  . History of breast cancer 04/04/2017  . Primary osteoarthritis of both feet 10/02/2016  . Primary osteoarthritis of both hands 10/02/2016  . High risk medications (not anticoagulants) long-term use 10/02/2016  . Rheumatoid arthritis, seronegative, multiple sites ([place]) 10/02/2016    Past Medical History:  Diagnosis Date  . Arthritis   . Cancer ([place])   . Osteoarthritis     History reviewed. No pertinent family history. Past Surgical History:  Procedure Laterality Date  . BACK SURGERY    . BREAST RECONSTRUCTION     Social History   Social History Narrative  . No narrative on file     Objective: Vital Signs: BP (!) 132/59 (BP Location: Left Arm, Patient Position: Sitting, Cuff Size: Normal)   Pulse 72   Ht 5\' 3"  (1.6 m)   Wt 126 lb (57.2 kg)   BMI 22.32 kg/m    Physical Exam  Constitutional: She is oriented to person, place, and time. She appears well-developed and well-nourished.  HENT:  Head: Normocephalic and atraumatic.  Eyes: Conjunctivae and EOM are normal.  Neck: Normal range of motion.  Cardiovascular: Normal rate, regular rhythm, normal heart sounds and intact distal pulses.   Pulmonary/Chest: Effort normal and breath sounds normal.  Abdominal: Soft. Bowel sounds are normal.    Lymphadenopathy:    She has no cervical adenopathy.  Neurological: She is alert and oriented to person, place, and time.  Skin: Skin is warm and dry. Capillary refill takes less than 2 seconds.  Psychiatric: She has a normal mood and affect. Her behavior is normal.  Nursing note and vitals reviewed.    Musculoskeletal Exam: C-spine and thoracic lumbar spine fairly good range of motion. Shoulder joints elbow joints are good range of motion. She is some limitation with range of motion of her wrist joints but no synovitis was noted. Synovial thickening was noted over bilateral second and third MCP joint but no synovitis was noted. Bilateral CMC PIP/DIP thickening was noted without synovitis. Hip joints knee joints ankles MTPs PIPs with good range of motion.  CDAI Exam: CDAI Homunculus Exam:   Joint Counts:  CDAI Tender Joint count: 0 CDAI Swollen Joint count: 0  Global Assessments:  Patient Global Assessment: 5 Provider Global Assessment: 3  CDAI Calculated Score: 8    Investigation: No additional findings. CBC Latest Ref Rng & Units 02/05/2017 08/02/2016  WBC 3.8 - 10.8 K/uL 4.9 6.0  Hemoglobin 11.7 - 15.5 g/dL 12.4 13.6  Hematocrit 35.0 - 45.0 % 37.4 41.7  Platelets 140 - 400 K/uL 222 217    CMP Latest Ref Rng & Units 02/05/2017 08/02/2016  Glucose 65 - 99 mg/dL 87 134(H)  BUN 7 - 25 mg/dL 15 10  Creatinine 0.60 - 0.93 mg/dL 0.82 0.78  Sodium 135 - 146 mmol/L 142 140  Potassium 3.5 - 5.3 mmol/L 4.2 4.3  Chloride 98 - 110 mmol/L 105 103  CO2 20 - 31 mmol/L 26 28  Calcium 8.6 - 10.4 mg/dL 8.9 9.2  Total Protein 6.1 - 8.1 g/dL 6.3 7.0  Total Bilirubin 0.2 - 1.2 mg/dL 0.4 0.4  Alkaline Phos 33 - 130 U/L 62 55  AST 10 - 35 U/L 17 22  ALT 6 - 29 U/L 13 17   Imaging: No results found.  Speciality Comments: No specialty comments available.    Procedures:  No procedures performed Allergies: Patient has no known allergies.   Assessment / Plan:     Visit Diagnoses:  Rheumatoid arthritis, seronegative, multiple sites ([place]) erosive disease neg RF,CCP, and ANA: She is not having any active synovitis today although she continues to have some stiffness in her joints. She has synovial thickening.  High risk medications (not anticoagulants) long-term use - Plaquenil 200 mg po qd and Methotrexate 4 tabs po qd, Folic acid 1 mg po qd - Plan: CBC with Differential/Platelet, COMPLETE METABOLIC PANEL WITH GFR. We will check her labs today and then every 3 months to monitor for drug toxicity. An eye exam form was also given for monitoring ocular toxicity from Plaquenil.  Primary osteoarthritis of both hands: She continues to have some stiffness in her hands. Joint protection and muscle strengthening discussed.  Primary osteoarthritis of both knees: Chronic stiffness  Primary osteoarthritis of both feet: She's been using proper fitting shoes  DDD (degenerative disc disease), lumbar history of lumbar surgery 1970: She is some chronic back pain from underlying disc disease.  Age-related osteoporosis without current pathological fracture - [place] . I do not have any of her bone densities available her bone DEXA D is monitored by her  GYN.  History of breast cancer  History of anxiety    Orders: Orders Placed This Encounter  Procedures  . CBC with Differential/Platelet  . COMPLETE METABOLIC PANEL WITH GFR   No orders of the defined types were placed in this encounter.   Face-to-face time spent with patient was 30 minutes. 50% of time was spent in counseling and coordination of care.  Follow-Up Instructions: Return in about 5 months (around 09/09/2017) for Rheumatoid arthritis, Osteoarthritis.   Bo Merino, MD  Note - This record has been created using [employer].  Chart creation errors have been sought, but may not always  have been located. Such creation errors do not reflect on  the standard of medical care.

## 2017-04-06 DIAGNOSIS — M810 Age-related osteoporosis without current pathological fracture: Secondary | ICD-10-CM | POA: Insufficient documentation

## 2017-04-06 DIAGNOSIS — M170 Bilateral primary osteoarthritis of knee: Secondary | ICD-10-CM | POA: Insufficient documentation

## 2017-04-06 DIAGNOSIS — Z8659 Personal history of other mental and behavioral disorders: Secondary | ICD-10-CM | POA: Insufficient documentation

## 2017-04-06 DIAGNOSIS — M5136 Other intervertebral disc degeneration, lumbar region: Secondary | ICD-10-CM | POA: Insufficient documentation

## 2017-04-09 ENCOUNTER — Ambulatory Visit: Payer: Medicare Other

## 2017-04-09 ENCOUNTER — Ambulatory Visit (INDEPENDENT_AMBULATORY_CARE_PROVIDER_SITE_OTHER): Payer: Medicare Other | Admitting: Rheumatology

## 2017-04-09 ENCOUNTER — Encounter: Payer: Self-pay | Admitting: Rheumatology

## 2017-04-09 ENCOUNTER — Encounter (INDEPENDENT_AMBULATORY_CARE_PROVIDER_SITE_OTHER): Payer: Self-pay

## 2017-04-09 VITALS — BP 132/59 | HR 72 | Ht 63.0 in | Wt 126.0 lb

## 2017-04-09 DIAGNOSIS — M19041 Primary osteoarthritis, right hand: Secondary | ICD-10-CM | POA: Diagnosis not present

## 2017-04-09 DIAGNOSIS — M19042 Primary osteoarthritis, left hand: Secondary | ICD-10-CM | POA: Diagnosis not present

## 2017-04-09 DIAGNOSIS — M0609 Rheumatoid arthritis without rheumatoid factor, multiple sites: Secondary | ICD-10-CM

## 2017-04-09 DIAGNOSIS — Z853 Personal history of malignant neoplasm of breast: Secondary | ICD-10-CM

## 2017-04-09 DIAGNOSIS — M19071 Primary osteoarthritis, right ankle and foot: Secondary | ICD-10-CM

## 2017-04-09 DIAGNOSIS — M19072 Primary osteoarthritis, left ankle and foot: Secondary | ICD-10-CM

## 2017-04-09 DIAGNOSIS — M5136 Other intervertebral disc degeneration, lumbar region: Secondary | ICD-10-CM | POA: Diagnosis not present

## 2017-04-09 DIAGNOSIS — M170 Bilateral primary osteoarthritis of knee: Secondary | ICD-10-CM | POA: Diagnosis not present

## 2017-04-09 DIAGNOSIS — Z79899 Other long term (current) drug therapy: Secondary | ICD-10-CM

## 2017-04-09 DIAGNOSIS — M810 Age-related osteoporosis without current pathological fracture: Secondary | ICD-10-CM

## 2017-04-09 DIAGNOSIS — Z8659 Personal history of other mental and behavioral disorders: Secondary | ICD-10-CM | POA: Diagnosis not present

## 2017-04-09 LAB — CBC WITH DIFFERENTIAL/PLATELET
Basophils Absolute: 57 cells/uL (ref 0–200)
Basophils Relative: 1 %
EOS PCT: 3 %
Eosinophils Absolute: 171 cells/uL (ref 15–500)
HCT: 42.2 % (ref 35.0–45.0)
HEMOGLOBIN: 14.1 g/dL (ref 11.7–15.5)
LYMPHS ABS: 1368 {cells}/uL (ref 850–3900)
Lymphocytes Relative: 24 %
MCH: 32.9 pg (ref 27.0–33.0)
MCHC: 33.4 g/dL (ref 32.0–36.0)
MCV: 98.4 fL (ref 80.0–100.0)
MONOS PCT: 7 %
MPV: 9.6 fL (ref 7.5–12.5)
Monocytes Absolute: 399 cells/uL (ref 200–950)
NEUTROS PCT: 65 %
Neutro Abs: 3705 cells/uL (ref 1500–7800)
PLATELETS: 218 10*3/uL (ref 140–400)
RBC: 4.29 MIL/uL (ref 3.80–5.10)
RDW: 14 % (ref 11.0–15.0)
WBC: 5.7 10*3/uL (ref 3.8–10.8)

## 2017-04-09 NOTE — Patient Instructions (Signed)
Standing Labs We placed an order today for your standing lab work.    Please come back and get your standing labs in October and every 3 months  We have open lab Monday through Friday from 8:30-11:30 AM and 1:30-4 PM at the office of Dr. Ersie Savino.   The office is located at [address], [address], [place], [place] [address] No appointment is necessary.   Labs are drawn by [facility].  You may receive a bill from [facility] for your lab work. If you have any questions regarding directions or hours of operation,  please call [phone].    

## 2017-04-09 NOTE — Progress Notes (Signed)
Rheumatology Medication Review by a Pharmacist Does the patient feel that his/her medications are working for him/her?  Yes Has the patient been experiencing any side effects to the medications prescribed?  No Does the patient have any problems obtaining medications?  No  Issues to address at subsequent visits: None   Pharmacist comments:  Megan Porter is a pleasant 76 yo F who presents for follow up of rheumatoid arthritis.  She is currently taking methotrexate 4 tablets weekly, folic acid 1 mg daily, and hydroxychloroquine 200 mg daily.  Most recent standing labs were normal on 02/05/17.  Most recent hydroxychloroquine eye exam was normal on 04/16/16.  Patient is due for hydroxychloroquine eye exam.  Provided patient with eye exam form and advised her to schedule eye exam.  Patient voiced understanding and denies any questions or concerns regarding her medications at this time.   Elisabeth Most, Pharm.D., BCPS, CPP Clinical Pharmacist Pager: [phone] Phone: [phone] 04/09/2017 3:56 PM

## 2017-04-10 ENCOUNTER — Other Ambulatory Visit: Payer: Self-pay | Admitting: Internal Medicine

## 2017-04-10 DIAGNOSIS — J189 Pneumonia, unspecified organism: Secondary | ICD-10-CM

## 2017-04-10 LAB — COMPLETE METABOLIC PANEL WITH GFR
ALK PHOS: 65 U/L (ref 33–130)
ALT: 26 U/L (ref 6–29)
AST: 25 U/L (ref 10–35)
Albumin: 4.4 g/dL (ref 3.6–5.1)
BUN: 16 mg/dL (ref 7–25)
CHLORIDE: 102 mmol/L (ref 98–110)
CO2: 27 mmol/L (ref 20–31)
Calcium: 9.8 mg/dL (ref 8.6–10.4)
Creat: 1.01 mg/dL — ABNORMAL HIGH (ref 0.60–0.93)
GFR, EST NON AFRICAN AMERICAN: 53 mL/min — AB (ref >=60)
GFR, Est African American: 62 mL/min (ref >=60)
Glucose, Bld: 67 mg/dL (ref 65–99)
POTASSIUM: 4.6 mmol/L (ref 3.5–5.3)
SODIUM: 139 mmol/L (ref 135–146)
Total Bilirubin: 0.3 mg/dL (ref 0.2–1.2)
Total Protein: 7.1 g/dL (ref 6.1–8.1)

## 2017-04-10 NOTE — Progress Notes (Signed)
Will monitor GFR for now.

## 2017-04-23 ENCOUNTER — Ambulatory Visit
Admission: RE | Admit: 2017-04-23 | Discharge: 2017-04-23 | Disposition: A | Discharge status: Home / Self Care | Payer: Medicare Other | Source: Ambulatory Visit | Attending: Internal Medicine | Admitting: Internal Medicine

## 2017-04-23 DIAGNOSIS — J189 Pneumonia, unspecified organism: Secondary | ICD-10-CM

## 2017-04-23 MED ORDER — IOPAMIDOL (ISOVUE-300) INJECTION 61%
75.0000 mL | Freq: Once | INTRAVENOUS | Status: AC | PRN
  Administered 2017-04-23: 75 mL via INTRAVENOUS

## 2017-04-24 ENCOUNTER — Telehealth: Payer: Self-pay | Admitting: Internal Medicine

## 2017-04-24 NOTE — Telephone Encounter (Signed)
Will still wait to see if PM will allow [place] to schedule pt on 05/17/17 since that appt is sooner that MW's open slot.

## 2017-04-24 NOTE — Telephone Encounter (Signed)
I have spoken with Sonya with Dr. Loren Racer office. Dr. Osborne Casco is requesting a consult for atypical pneumonia.  PM has two held slot on 8/31 at 4:00, otherwise first available would be 9/25 with RB. Dr. Melvyn Novas has some availability on 05/31/17, however Davy Pique reported she would prefer consult be made with another provider.   PM please advise if okay to use 05/17/17 held slot? Thanks.

## 2017-04-24 NOTE — Telephone Encounter (Signed)
Sonya with Dr. Loren Racer office is returning phone call. Sonya stated Dr. Melvyn Novas is ok to schedule with patient.

## 2017-04-25 NOTE — Telephone Encounter (Signed)
OK to use the 8/31 slot.

## 2017-04-25 NOTE — Telephone Encounter (Signed)
Called [facility] and scheduled relayed appt date/time Pt is scheduled with Dr Vaughan Browner at [place] on 05/17/2017 Nothing further needed.

## 2017-04-25 NOTE — Telephone Encounter (Signed)
Kenney Houseman from [facility] checking to see if we were able to get Ms. Nop in?  Sonya # [phone] ext [phone]

## 2017-04-25 NOTE — Telephone Encounter (Signed)
Lm with Kenney Houseman at [facility] to make her aware that we are awaiting a response from PM.

## 2017-05-17 ENCOUNTER — Encounter: Payer: Self-pay | Admitting: Pulmonary Disease

## 2017-05-17 ENCOUNTER — Ambulatory Visit (INDEPENDENT_AMBULATORY_CARE_PROVIDER_SITE_OTHER): Payer: Medicare Other | Admitting: Pulmonary Disease

## 2017-05-17 VITALS — BP 128/68 | HR 79 | Ht 63.0 in | Wt 128.0 lb

## 2017-05-17 DIAGNOSIS — J479 Bronchiectasis, uncomplicated: Secondary | ICD-10-CM | POA: Diagnosis not present

## 2017-05-17 DIAGNOSIS — R05 Cough: Secondary | ICD-10-CM

## 2017-05-17 DIAGNOSIS — R059 Cough, unspecified: Secondary | ICD-10-CM

## 2017-05-17 MED ORDER — FLUTTER DEVI: 0 refills | Status: DC

## 2017-05-17 NOTE — Patient Instructions (Addendum)
We will give you a sputum cup for AFB and sputum cultures We give he a flutter valve to help clear secretions clinic and use over-the-counter Mucinex for clearance of mucous Will schedule you for pulmonary function tests Return to clinic in 3 months. Please call us if there is any change in his symptoms.

## 2017-05-17 NOTE — Progress Notes (Addendum)
Elon Lomeli    [id number]    18-Apr-1941  Primary Care Physician:Tisovec, Fransico Him, MD  Referring Physician: Haywood Pao, MD [address] [place], [place] [address]  Chief complaint:  Follow-up after pneumonia  HPI: Mrs. Altic is a 76 year old with rheumatoid arthritis, cancer.She was hospitalized in May 2018 at [facility] for respiratory failure with multilobar infiltrates on CT scan. Flu test, Legionella urine test was negative.She was treated with a prednisone taper and Levaquin. She apparently had drug reaction with hives to Levaquin and it was changed to a different antibiotics. Since her return to [facility] she's had a follow-up CT scan earlier this month which showed resolution of the infiltrates.There is some mild bronchiectasis with mucus plugging in the right middle lobe. She has been referred to pulmonary for further evaluation.  She has history of limited arthritis and is followed by Dr. Estanislado Pandy. She is being maintained on methotrexate and Plaquenil. Her arthritis symptoms are under good control with no flare. She feels well at present with some mild dyspnea with activity. She has occasional nonproductive cough. No sputum production, fevers, chills.   Pets:No pets. She used to have a dog. She does not have any birds, exotic pets or exposure to farm animals Occupation:Homemaker Exposures:No known exposures. No mold issues at home. Smoking history:Never smoker  Outpatient Encounter Prescriptions as of 05/17/2017  Medication Sig  . calcium citrate (CALCITRATE - DOSED IN MG ELEMENTAL CALCIUM) 950 MG tablet Take 200 mg of elemental calcium by mouth daily.  . cholecalciferol (VITAMIN D) 1000 units tablet Take 1,000 Units by mouth daily.  . citalopram (CELEXA) 40 MG tablet   . folic acid (FOLVITE) 1 MG tablet Take by mouth.  . hydroxychloroquine (PLAQUENIL) 200 MG tablet Take 200 mg by mouth daily.   Marland Kitchen levothyroxine (SYNTHROID, LEVOTHROID) 50 MCG tablet     . methotrexate (RHEUMATREX) 2.5 MG tablet TAKE 4 TABLETS EVERY WEEK  . Multiple Vitamin (MULTIVITAMIN) capsule Take 1 capsule by mouth daily.  . raloxifene (EVISTA) 60 MG tablet TK 1 T PO D  . traZODone (DESYREL) 50 MG tablet    No facility-administered encounter medications on file as of 05/17/2017.     Allergies as of 05/17/2017  . (No Known Allergies)    Past Medical History:  Diagnosis Date  . Arthritis   . Cancer ([place])   . Osteoarthritis     Past Surgical History:  Procedure Laterality Date  . BACK SURGERY    . BREAST RECONSTRUCTION      Family History  Problem Relation Age of Onset  . Family history unknown: Yes    Social History   Social History  . Marital status: Married    Spouse name: N/A  . Number of children: N/A  . Years of education: N/A   Occupational History  . Not on file.   Social History Main Topics  . Smoking status: Never Smoker  . Smokeless tobacco: Never Used  . Alcohol use 0.6 oz/week    1 Glasses of wine per week  . Drug use: No  . Sexual activity: Not on file   Other Topics Concern  . Not on file   Social History Narrative  . No narrative on file    Review of systems: Review of Systems  Constitutional: Negative for fever and chills.  HENT: Negative.   Eyes: Negative for blurred vision.  Respiratory: as per HPI  Cardiovascular: Negative for chest pain and palpitations.  Gastrointestinal:  Negative for vomiting, diarrhea, blood per rectum. Genitourinary: Negative for dysuria, urgency, frequency and hematuria.  Musculoskeletal: Negative for myalgias, back pain and joint pain.  Skin: Negative for itching and rash.  Neurological: Negative for dizziness, tremors, focal weakness, seizures and loss of consciousness.  Endo/Heme/Allergies: Negative for environmental allergies.  Psychiatric/Behavioral: Negative for depression, suicidal ideas and hallucinations.  All other systems reviewed and are negative.  Physical Exam: Blood  pressure 128/68, pulse 79, height 5\' 3"  (1.6 m), weight 128 lb (58.1 kg), SpO2 96 %. Gen:      No acute distress HEENT:  EOMI, sclera anicteric Neck:     No masses; no thyromegaly Lungs:    Clear to auscultation bilaterally; normal respiratory effort CV:         Regular rate and rhythm; no murmurs Abd:      + bowel sounds; soft, non-tender; no palpable masses, no distension Ext:    No edema; adequate peripheral perfusion Skin:      Warm and dry; no rash Neuro: alert and oriented x 3 Psych: normal mood and affect  Data Reviewed: DATA from [facility] Chest x-ray 12/16/16- left upper lobe consolidation, right mid lung consolidation CT chest 12/18/16- Small to moderate bilateral pleural effusion, dense alveolar infiltrate throughout the left upper lobe, right middle lobe and left lower lobe with underlying nodularity. Mediastinal lymphadenopathy.  CT scan 04/23/17-  Mild bronchiectasis and mucous plugging in the right middle lobe, lingula. No lymphadenopathy. I have reviewed the images personally.  Assessment:  Follow-up after recent multilobar pneumonia I have reviewed the CT scan which shows some mild bronchiectasis with mucus plugging. This could be a residual of her recent multilobar penumonia. Atypical infection such as MAI is possible but suspicion is not high. Other possibilities include pulmonary manifestations of her rheumatoid arthritis which can present as bronchiectasis.  I will give her a flutter valve and check AFB for MAI if she can make sputum. She'll use over-the-counter Mucinex for clearance of mucous I'll schedule her for pulmonary function tests. If she she has another exacerbation We may consider a bronchoscopy  Plan/Recommendations: - Flutter valve, Mucinex - Sputum for AFB - PFTs  Marshell Garfinkel MD [facility] Pager [phone] 05/17/2017, 4:47 PM  CC: Tisovec, Fransico Him, MD

## 2017-05-21 DIAGNOSIS — J479 Bronchiectasis, uncomplicated: Secondary | ICD-10-CM | POA: Insufficient documentation

## 2017-05-29 ENCOUNTER — Telehealth: Payer: Self-pay | Admitting: Pulmonary Disease

## 2017-05-29 DIAGNOSIS — R300 Dysuria: Secondary | ICD-10-CM | POA: Diagnosis not present

## 2017-05-29 NOTE — Telephone Encounter (Signed)
Noted  

## 2017-06-14 ENCOUNTER — Other Ambulatory Visit: Payer: Self-pay | Admitting: Rheumatology

## 2017-06-14 NOTE — Telephone Encounter (Signed)
Last Visit: 04/09/17 Next Visit: 09/24/17 Labs: 04/09/17 Creat 1.01 GFR 53 Previously normal Will monitor GFR for now  Okay to refill per Dr. Estanislado Pandy

## 2017-06-20 DIAGNOSIS — H2512 Age-related nuclear cataract, left eye: Secondary | ICD-10-CM | POA: Diagnosis not present

## 2017-06-20 DIAGNOSIS — Z79899 Other long term (current) drug therapy: Secondary | ICD-10-CM | POA: Diagnosis not present

## 2017-06-20 DIAGNOSIS — H524 Presbyopia: Secondary | ICD-10-CM | POA: Diagnosis not present

## 2017-07-01 DIAGNOSIS — R300 Dysuria: Secondary | ICD-10-CM | POA: Diagnosis not present

## 2017-07-01 DIAGNOSIS — Z681 Body mass index (BMI) 19 or less, adult: Secondary | ICD-10-CM | POA: Diagnosis not present

## 2017-07-01 DIAGNOSIS — R350 Frequency of micturition: Secondary | ICD-10-CM | POA: Diagnosis not present

## 2017-07-03 DIAGNOSIS — N952 Postmenopausal atrophic vaginitis: Secondary | ICD-10-CM | POA: Diagnosis not present

## 2017-07-03 DIAGNOSIS — R311 Benign essential microscopic hematuria: Secondary | ICD-10-CM | POA: Diagnosis not present

## 2017-07-24 DIAGNOSIS — N952 Postmenopausal atrophic vaginitis: Secondary | ICD-10-CM | POA: Diagnosis not present

## 2017-07-24 DIAGNOSIS — R311 Benign essential microscopic hematuria: Secondary | ICD-10-CM | POA: Diagnosis not present

## 2017-07-30 ENCOUNTER — Other Ambulatory Visit: Payer: Self-pay

## 2017-07-30 DIAGNOSIS — Z79631 Long term (current) use of antimetabolite agent: Secondary | ICD-10-CM

## 2017-07-30 DIAGNOSIS — Z79899 Other long term (current) drug therapy: Secondary | ICD-10-CM

## 2017-07-30 LAB — CBC WITH DIFFERENTIAL/PLATELET
BASOS ABS: 49 {cells}/uL (ref 0–200)
BASOS PCT: 0.9 %
EOS PCT: 1.7 %
Eosinophils Absolute: 92 cells/uL (ref 15–500)
HEMATOCRIT: 39.2 % (ref 35.0–45.0)
HEMOGLOBIN: 13.7 g/dL (ref 11.7–15.5)
LYMPHS ABS: 1048 {cells}/uL (ref 850–3900)
MCH: 33.7 pg — ABNORMAL HIGH (ref 27.0–33.0)
MCHC: 34.9 g/dL (ref 32.0–36.0)
MCV: 96.6 fL (ref 80.0–100.0)
MONOS PCT: 8.3 %
MPV: 10.7 fL (ref 7.5–12.5)
NEUTROS ABS: 3764 {cells}/uL (ref 1500–7800)
Neutrophils Relative %: 69.7 %
Platelets: 206 10*3/uL (ref 140–400)
RBC: 4.06 10*6/uL (ref 3.80–5.10)
RDW: 12.7 % (ref 11.0–15.0)
Total Lymphocyte: 19.4 %
WBC mixed population: 448 cells/uL (ref 200–950)
WBC: 5.4 10*3/uL (ref 3.8–10.8)

## 2017-07-30 LAB — COMPLETE METABOLIC PANEL WITH GFR
AG RATIO: 1.6 (calc) (ref 1.0–2.5)
ALT: 14 U/L (ref 6–29)
AST: 18 U/L (ref 10–35)
Albumin: 4.3 g/dL (ref 3.6–5.1)
Alkaline phosphatase (APISO): 59 U/L (ref 33–130)
BILIRUBIN TOTAL: 0.5 mg/dL (ref 0.2–1.2)
BUN: 14 mg/dL (ref 7–25)
CHLORIDE: 101 mmol/L (ref 98–110)
CO2: 34 mmol/L — ABNORMAL HIGH (ref 20–32)
Calcium: 9.8 mg/dL (ref 8.6–10.4)
Creat: 0.75 mg/dL (ref 0.60–0.93)
GFR, Est African American: 88 mL/min/{1.73_m2} (ref >=60)
GFR, Est Non African American: 76 mL/min/{1.73_m2} (ref >=60)
GLUCOSE: 83 mg/dL (ref 65–99)
Globulin: 2.7 g/dL (calc) (ref 1.9–3.7)
POTASSIUM: 4.6 mmol/L (ref 3.5–5.3)
Sodium: 140 mmol/L (ref 135–146)
TOTAL PROTEIN: 7 g/dL (ref 6.1–8.1)

## 2017-07-30 NOTE — Progress Notes (Signed)
stable °

## 2017-08-06 ENCOUNTER — Ambulatory Visit (INDEPENDENT_AMBULATORY_CARE_PROVIDER_SITE_OTHER): Payer: Medicare Other | Admitting: Pulmonary Disease

## 2017-08-06 ENCOUNTER — Encounter: Payer: Self-pay | Admitting: Pulmonary Disease

## 2017-08-06 VITALS — BP 124/76 | HR 86 | Ht 62.5 in | Wt 126.0 lb

## 2017-08-06 DIAGNOSIS — J479 Bronchiectasis, uncomplicated: Secondary | ICD-10-CM

## 2017-08-06 DIAGNOSIS — R059 Cough, unspecified: Secondary | ICD-10-CM

## 2017-08-06 DIAGNOSIS — R05 Cough: Secondary | ICD-10-CM

## 2017-08-06 LAB — PULMONARY FUNCTION TEST
DL/VA % PRED: 104 %
DL/VA: 4.82 ml/min/mmHg/L
DLCO COR: 15.24 ml/min/mmHg
DLCO cor % pred: 68 %
DLCO unc % pred: 70 %
DLCO unc: 15.69 ml/min/mmHg
FEF 25-75 PRE: 0.76 L/s
FEF 25-75 Post: 0.91 L/sec
FEF2575-%CHANGE-POST: 19 %
FEF2575-%Pred-Post: 64 %
FEF2575-%Pred-Pre: 54 %
FEV1-%Change-Post: 3 %
FEV1-%PRED-POST: 71 %
FEV1-%PRED-PRE: 68 %
FEV1-Post: 1.32 L
FEV1-Pre: 1.27 L
FEV1FVC-%CHANGE-POST: 2 %
FEV1FVC-%Pred-Pre: 95 %
FEV6-%CHANGE-POST: 0 %
FEV6-%PRED-POST: 77 %
FEV6-%PRED-PRE: 76 %
FEV6-PRE: 1.8 L
FEV6-Post: 1.81 L
FEV6FVC-%CHANGE-POST: 0 %
FEV6FVC-%PRED-PRE: 106 %
FEV6FVC-%Pred-Post: 105 %
FVC-%Change-Post: 1 %
FVC-%Pred-Post: 73 %
FVC-%Pred-Pre: 72 %
FVC-Post: 1.82 L
FVC-Pre: 1.8 L
POST FEV6/FVC RATIO: 99 %
Post FEV1/FVC ratio: 73 %
Pre FEV1/FVC ratio: 71 %
Pre FEV6/FVC Ratio: 100 %

## 2017-08-06 NOTE — Progress Notes (Signed)
Megan Porter    [id number]    1941/06/25  Primary Care Physician:Tisovec, Fransico Him, MD  Referring Physician: Haywood Pao, MD [address] [place], [place] [address]  Chief complaint:  Follow-up  For bronchiectasis, pneumonia  HPI: Megan Porter is a 76 year old with rheumatoid arthritis, cancer.She was hospitalized in May 2018 at [facility] for respiratory failure with multilobar infiltrates on CT scan. Flu test, Legionella urine test was negative.She was treated with a prednisone taper and Levaquin. She apparently had drug reaction with hives to Levaquin and it was changed to a different antibiotics. Since her return to [facility] she's had a follow-up CT scan earlier this month which showed resolution of the infiltrates.There is some mild bronchiectasis with mucus plugging in the right middle lobe. She has been referred to pulmonary for further evaluation.  She has history of rheumatoid arthritis and is followed by Megan Porter. She is being maintained on methotrexate and Plaquenil. Her arthritis symptoms are under good control with no flare. She feels well at present with some mild dyspnea with activity. She has occasional nonproductive cough. No sputum production, fevers, chills.   Pets:No pets. She used to have a dog. She does not have any birds, exotic pets or exposure to farm animals Occupation:Homemaker Exposures:No known exposures. No mold issues at home. Smoking history:Never smoker  Interim history: She continues to do well with no issues.  Her cough and sputum production have resolved.  She has no new complaints today  Outpatient Encounter Medications as of 08/06/2017  Medication Sig  . calcium citrate (CALCITRATE - DOSED IN MG ELEMENTAL CALCIUM) 950 MG tablet Take 200 mg of elemental calcium by mouth daily.  . cholecalciferol (VITAMIN D) 1000 units tablet Take 1,000 Units by mouth daily.  . citalopram (CELEXA) 40 MG tablet   . folic acid (FOLVITE) 1  MG tablet Take by mouth.  . hydroxychloroquine (PLAQUENIL) 200 MG tablet Take 200 mg by mouth daily.   Marland Kitchen levothyroxine (SYNTHROID, LEVOTHROID) 50 MCG tablet   . methotrexate (RHEUMATREX) 2.5 MG tablet TAKE 4 TABLETS EVERY WEEK  . Multiple Vitamin (MULTIVITAMIN) capsule Take 1 capsule by mouth daily.  . raloxifene (EVISTA) 60 MG tablet TK 1 T PO D  . Respiratory Therapy Supplies (FLUTTER) DEVI Use as directed  . traZODone (DESYREL) 50 MG tablet    No facility-administered encounter medications on file as of 08/06/2017.     Allergies as of 08/06/2017  . (No Known Allergies)    Past Medical History:  Diagnosis Date  . Arthritis   . Cancer ([place])   . Osteoarthritis     Past Surgical History:  Procedure Laterality Date  . BACK SURGERY    . BREAST RECONSTRUCTION      Family History  Family history unknown: Yes    Social History   Socioeconomic History  . Marital status: Married    Spouse name: Not on file  . Number of children: Not on file  . Years of education: Not on file  . Highest education level: Not on file  Social Needs  . Financial resource strain: Not on file  . Food insecurity - worry: Not on file  . Food insecurity - inability: Not on file  . Transportation needs - medical: Not on file  . Transportation needs - non-medical: Not on file  Occupational History  . Not on file  Tobacco Use  . Smoking status: Never Smoker  . Smokeless tobacco: Never Used  Substance  and Sexual Activity  . Alcohol use: Yes    Alcohol/week: 0.6 oz    Types: 1 Glasses of wine per week  . Drug use: No  . Sexual activity: Not on file  Other Topics Concern  . Not on file  Social History Narrative  . Not on file    Review of systems: Review of Systems  Constitutional: Negative for fever and chills.  HENT: Negative.   Eyes: Negative for blurred vision.  Respiratory: as per HPI  Cardiovascular: Negative for chest pain and palpitations.  Gastrointestinal: Negative for  vomiting, diarrhea, blood per rectum. Genitourinary: Negative for dysuria, urgency, frequency and hematuria.  Musculoskeletal: Negative for myalgias, back pain and joint pain.  Skin: Negative for itching and rash.  Neurological: Negative for dizziness, tremors, focal weakness, seizures and loss of consciousness.  Endo/Heme/Allergies: Negative for environmental allergies.  Psychiatric/Behavioral: Negative for depression, suicidal ideas and hallucinations.  All other systems reviewed and are negative.  Physical Exam: Blood pressure 128/68, pulse 79, height 5\' 3"  (1.6 m), weight 128 lb (58.1 kg), SpO2 96 %. Gen:      No acute distress HEENT:  EOMI, sclera anicteric Neck:     No masses; no thyromegaly Lungs:    Clear to auscultation bilaterally; normal respiratory effort CV:         Regular rate and rhythm; no murmurs Abd:      + bowel sounds; soft, non-tender; no palpable masses, no distension Ext:    No edema; adequate peripheral perfusion Skin:      Warm and dry; no rash Neuro: alert and oriented x 3 Psych: normal mood and affect  Data Reviewed: DATA from [facility] Chest x-ray 12/16/16- left upper lobe consolidation, right mid lung consolidation CT chest 12/18/16- Small to moderate bilateral pleural effusion, dense alveolar infiltrate throughout the left upper lobe, right middle lobe and left lower lobe with underlying nodularity. Mediastinal lymphadenopathy.  CT scan 04/23/17-  Mild bronchiectasis and mucous plugging in the right middle lobe, lingula. No lymphadenopathy. I have reviewed the images personally.  PFTs 08/06/17 FVC 1.82 [73%], FEV1 1.32 [71%], F/F 73, TLC 67%, DLCO 70% Mild obstruction, moderate restriction, mild diffusion defect  Assessment:  Follow-up after recent multilobar pneumonia Bronchiectasis I have reviewed the CT scan which shows some mild bronchiectasis with mucus plugging. This could be a residual of her recent multilobar penumonia. Atypical infection such  as MAI is possible but suspicion is not high. Other possibilities include pulmonary manifestations of her rheumatoid arthritis which can present as bronchiectasis.  She feels back to baseline.  Not using flutter valve as she is not having any cough.  We are unable to get sputum for AFB since she is not having mucus production PFTs showed mild obstruction with restriction in diffusion defect.  Since she is doing well clinically we will continue to follow with conservative management.  Plan/Recommendations: -Conservative management Follow-up in 6 months.  Marshell Garfinkel MD [facility] Pager [phone] 08/06/2017, 2:08 PM  CC: Tisovec, Fransico Him, MD

## 2017-08-06 NOTE — Progress Notes (Signed)
PFT done today. 

## 2017-08-06 NOTE — Patient Instructions (Signed)
I am glad that your breathing is doing well Your lung function test show mild changes that could be from bronchiectasis I do not believe we need to do anything about it at present We will continue to manage conservatively Follow-up in 6 months.

## 2017-08-09 ENCOUNTER — Other Ambulatory Visit: Payer: Self-pay | Admitting: Rheumatology

## 2017-08-12 NOTE — Telephone Encounter (Signed)
Last visit: 04/09/2017 Next visit: 09/24/2017  Okay to refill per Dr. Estanislado Pandy.

## 2017-08-21 DIAGNOSIS — H35371 Puckering of macula, right eye: Secondary | ICD-10-CM | POA: Diagnosis not present

## 2017-08-21 DIAGNOSIS — H43822 Vitreomacular adhesion, left eye: Secondary | ICD-10-CM | POA: Diagnosis not present

## 2017-08-21 DIAGNOSIS — Z79899 Other long term (current) drug therapy: Secondary | ICD-10-CM | POA: Diagnosis not present

## 2017-09-05 ENCOUNTER — Other Ambulatory Visit: Payer: Self-pay | Admitting: Rheumatology

## 2017-09-06 NOTE — Telephone Encounter (Signed)
Last visit: 04/09/2017 Next visit: 09/24/2017 Labs: 07/30/17 stable  Okay to refill per Dr. Estanislado Pandy.

## 2017-09-12 NOTE — Progress Notes (Signed)
Office Visit Note  Patient: Megan Porter             Date of Birth: May 01, 1941           MRN: [id number]             PCP: Haywood Pao, MD Referring: Haywood Pao, MD Visit Date: 09/24/2017 Occupation: @GUAROCC @    Subjective:    History of Present Illness: Darianne Porter is a 76 y.o. female seronegative rheumatoid arthritis and osteoarthritis.  She continues to take PLq 200 mg once daily and MTX 4 tablets once weekly, and folic acid 1 mg daily.  She states she has occasional right hand swelling first thing in the morning.  She has minimal joint stiffness.  She denies any joint pain at this time.  Her knees and feet has not been causing her discomfort recently.  She has pedal edema bilaterally.      Activities of Daily Living:  Patient reports morning stiffness for 15 minutes.   Patient Denies nocturnal pain.  Difficulty dressing/grooming: Denies Difficulty climbing stairs: Denies Difficulty getting out of chair: Reports Difficulty using hands for taps, buttons, cutlery, and/or writing: Denies   Review of Systems  Constitutional: Negative for fatigue and weakness.  HENT: Negative for mouth sores, mouth dryness and nose dryness.   Eyes: Negative for redness, visual disturbance and dryness.  Respiratory: Negative for cough, hemoptysis, shortness of breath and difficulty breathing.   Cardiovascular: Positive for swelling in legs/feet. Negative for chest pain, palpitations, hypertension and irregular heartbeat.  Gastrointestinal: Negative for blood in stool, constipation and diarrhea.  Endocrine: Negative for increased urination.  Genitourinary: Negative for painful urination.  Musculoskeletal: Positive for arthralgias, joint pain, joint swelling and morning stiffness. Negative for myalgias, muscle weakness, muscle tenderness and myalgias.  Skin: Negative for color change, pallor, rash, hair loss, nodules/bumps, redness, skin tightness, ulcers and sensitivity to sunlight.   Neurological: Negative for dizziness, numbness and headaches.  Hematological: Negative for swollen glands.  Psychiatric/Behavioral: Positive for sleep disturbance. Negative for depressed mood. The patient is not nervous/anxious.     PMFS History:  Patient Active Problem List   Diagnosis Date Noted  . Bronchiectasis without complication ([place]) 29/92/4268  . Primary osteoarthritis of both knees 04/06/2017  . DDD (degenerative disc disease), lumbar history of lumbar surgery 1970 04/06/2017  . Age-related osteoporosis without current pathological fracture 04/06/2017  . History of anxiety 04/06/2017  . History of breast cancer 04/04/2017  . Primary osteoarthritis of both feet 10/02/2016  . Primary osteoarthritis of both hands 10/02/2016  . High risk medications (not anticoagulants) long-term use 10/02/2016  . Rheumatoid arthritis, seronegative, multiple sites ([place]) 10/02/2016    Past Medical History:  Diagnosis Date  . Arthritis   . Cancer ([place])   . Osteoarthritis     Family History  Family history unknown: Yes   Past Surgical History:  Procedure Laterality Date  . BACK SURGERY    . BREAST RECONSTRUCTION     Social History   Social History Narrative  . Not on file     Objective: Vital Signs: BP 139/69 (BP Location: Left Arm, Patient Position: Sitting, Cuff Size: Normal)   Pulse 72   Resp 15   Ht 5\' 3"  (1.6 m)   Wt 128 lb (58.1 kg)   BMI 22.67 kg/m    Physical Exam  Constitutional: She is oriented to person, place, and time. She appears well-developed and well-nourished.  HENT:  Head: Normocephalic and atraumatic.  Eyes: Conjunctivae  and EOM are normal.  Neck: Normal range of motion.  Cardiovascular: Normal rate, regular rhythm, normal heart sounds and intact distal pulses.  Pulmonary/Chest: Effort normal and breath sounds normal.  Abdominal: Soft. Bowel sounds are normal.  Musculoskeletal: She exhibits edema (Pedal edema).  Lymphadenopathy:    She has no  cervical adenopathy.  Neurological: She is alert and oriented to person, place, and time.  Skin: Skin is warm and dry. Capillary refill takes less than 2 seconds.  Psychiatric: She has a normal mood and affect. Her behavior is normal.  Nursing note and vitals reviewed.    Musculoskeletal Exam: C-spine limited ROM with lateral rotation.  Thoracic and lumbar spine good ROM.  Shoulder joints, elbow joints, wrist joints, MCPs, PIPs, and DIPs good ROM with no synovitis.  She has PIP and DIP synovial thickening bilaterally.  Right hip limited ROM compared with left hip.  No tenderness of trochanteric bursa.  No midline spinal tenderness.  No SI joint tenderness.  Knee crepitus bilaterally.  No warmth or effusion.  Good ROM of ankles, MTPs, PIP, and DIP with no synovitis.  Bilateral pedal edema noted.    CDAI Exam: CDAI Homunculus Exam:   Joint Counts:  CDAI Tender Joint count: 0 CDAI Swollen Joint count: 0  Global Assessments:  Patient Global Assessment: 3 Provider Global Assessment: 3  CDAI Calculated Score: 6    Investigation: No additional findings.PLQ eye exam: 06/20/2017 CBC Latest Ref Rng & Units 07/30/2017 04/09/2017 02/05/2017  WBC 3.8 - 10.8 Thousand/uL 5.4 5.7 4.9  Hemoglobin 11.7 - 15.5 g/dL 13.7 14.1 12.4  Hematocrit 35.0 - 45.0 % 39.2 42.2 37.4  Platelets 140 - 400 Thousand/uL 206 218 222   CMP Latest Ref Rng & Units 07/30/2017 04/09/2017 02/05/2017  Glucose 65 - 99 mg/dL 83 67 87  BUN 7 - 25 mg/dL 14 16 15   Creatinine 0.60 - 0.93 mg/dL 0.75 1.01(H) 0.82  Sodium 135 - 146 mmol/L 140 139 142  Potassium 3.5 - 5.3 mmol/L 4.6 4.6 4.2  Chloride 98 - 110 mmol/L 101 102 105  CO2 20 - 32 mmol/L 34(H) 27 26  Calcium 8.6 - 10.4 mg/dL 9.8 9.8 8.9  Total Protein 6.1 - 8.1 g/dL 7.0 7.1 6.3  Total Bilirubin 0.2 - 1.2 mg/dL 0.5 0.3 0.4  Alkaline Phos 33 - 130 U/L - 65 62  AST 10 - 35 U/L 18 25 17   ALT 6 - 29 U/L 14 26 13     Imaging: No results found.  Speciality Comments: No  specialty comments available.    Procedures:  No procedures performed Allergies: Patient has no known allergies.   Assessment / Plan:     Visit Diagnoses: Rheumatoid arthritis, seronegative, multiple sites ([place]) - erosive disease neg RF,CCP, and ANA: She has no synovitis on exam.  She has no joint pain and minimal joint stiffness.  She will continue on PLQ 200 mg po 1 tablet daily, MTX 4 tablets once weekly, and folic acid 1 mg.    High risk medications (not anticoagulants) long-term use - Plaquenil 200 mg po qd and Methotrexate 4 tabs po qd, Folic acid 1 mg po qdeye exam: 06/20/2017.  CBC and CMP standing orders were placed for February and every 3 months following. - Plan: CBC with Differential/Platelet, COMPLETE METABOLIC PANEL WITH GFR  Primary osteoarthritis of both hands: She has PIP and DIP synovial thickening.  No discomfort of CMC joints.  No synovitis on exam.  She has no pain in her hands at  this time.   Primary osteoarthritis of both knees:  Doing well.  Mild crepitus on exam.  No warmth or effusion.  She denies any knee pain currently.    Primary osteoarthritis of both feet: Doing well.  No discomfort.  She wears proper fitting shoes.    DDD (degenerative disc disease), lumbar history of lumbar surgery 1970: No midline spinal tenderness.    Age-related osteoporosis without current pathological fracture - [place] . Her gynecologist monitors her bone density.    Other medical conditions are listed as follows:   History of anxiety  History of breast cancer    Orders: Orders Placed This Encounter  Procedures  . CBC with Differential/Platelet  . COMPLETE METABOLIC PANEL WITH GFR   No orders of the defined types were placed in this encounter.    Follow-Up Instructions: Return in about 5 months (around 02/22/2018) for Rheumatoid arthritis, Osteoarthritis.   Bo Merino, MD  Note - This record has been created using Editor, commissioning.  Chart creation errors have  been sought, but may not always  have been located. Such creation errors do not reflect on  the standard of medical care.

## 2017-09-24 ENCOUNTER — Encounter (INDEPENDENT_AMBULATORY_CARE_PROVIDER_SITE_OTHER): Payer: Self-pay

## 2017-09-24 ENCOUNTER — Encounter: Payer: Self-pay | Admitting: Rheumatology

## 2017-09-24 ENCOUNTER — Ambulatory Visit (INDEPENDENT_AMBULATORY_CARE_PROVIDER_SITE_OTHER): Payer: Medicare Other | Admitting: Rheumatology

## 2017-09-24 VITALS — BP 139/69 | HR 72 | Resp 15 | Ht 63.0 in | Wt 128.0 lb

## 2017-09-24 DIAGNOSIS — M19072 Primary osteoarthritis, left ankle and foot: Secondary | ICD-10-CM | POA: Diagnosis not present

## 2017-09-24 DIAGNOSIS — E038 Other specified hypothyroidism: Secondary | ICD-10-CM | POA: Diagnosis not present

## 2017-09-24 DIAGNOSIS — Z8659 Personal history of other mental and behavioral disorders: Secondary | ICD-10-CM

## 2017-09-24 DIAGNOSIS — M170 Bilateral primary osteoarthritis of knee: Secondary | ICD-10-CM

## 2017-09-24 DIAGNOSIS — E7800 Pure hypercholesterolemia, unspecified: Secondary | ICD-10-CM | POA: Diagnosis not present

## 2017-09-24 DIAGNOSIS — M19041 Primary osteoarthritis, right hand: Secondary | ICD-10-CM

## 2017-09-24 DIAGNOSIS — M0609 Rheumatoid arthritis without rheumatoid factor, multiple sites: Secondary | ICD-10-CM | POA: Diagnosis not present

## 2017-09-24 DIAGNOSIS — Z853 Personal history of malignant neoplasm of breast: Secondary | ICD-10-CM

## 2017-09-24 DIAGNOSIS — M810 Age-related osteoporosis without current pathological fracture: Secondary | ICD-10-CM

## 2017-09-24 DIAGNOSIS — M19042 Primary osteoarthritis, left hand: Secondary | ICD-10-CM

## 2017-09-24 DIAGNOSIS — M5136 Other intervertebral disc degeneration, lumbar region: Secondary | ICD-10-CM | POA: Diagnosis not present

## 2017-09-24 DIAGNOSIS — R82998 Other abnormal findings in urine: Secondary | ICD-10-CM | POA: Diagnosis not present

## 2017-09-24 DIAGNOSIS — M859 Disorder of bone density and structure, unspecified: Secondary | ICD-10-CM | POA: Diagnosis not present

## 2017-09-24 DIAGNOSIS — E559 Vitamin D deficiency, unspecified: Secondary | ICD-10-CM | POA: Diagnosis not present

## 2017-09-24 DIAGNOSIS — M19071 Primary osteoarthritis, right ankle and foot: Secondary | ICD-10-CM

## 2017-09-24 DIAGNOSIS — M51369 Other intervertebral disc degeneration, lumbar region without mention of lumbar back pain or lower extremity pain: Secondary | ICD-10-CM

## 2017-09-24 DIAGNOSIS — Z79899 Other long term (current) drug therapy: Secondary | ICD-10-CM | POA: Diagnosis not present

## 2017-09-24 NOTE — Patient Instructions (Addendum)
Standing Labs We placed an order today for your standing lab work.    Please come back and get your standing labs in February and every 3 months  We have open lab Monday through Friday from 8:30-11:30 AM and 1:30-4 PM at the office of Dr. Shaili Deveshwar.   The office is located at [address], [address], [place], [place] [address] No appointment is necessary.   Labs are drawn by [facility].  You may receive a bill from [facility] for your lab work. If you have any questions regarding directions or hours of operation,  please call [phone].    

## 2017-10-01 DIAGNOSIS — Z1389 Encounter for screening for other disorder: Secondary | ICD-10-CM | POA: Diagnosis not present

## 2017-10-01 DIAGNOSIS — R300 Dysuria: Secondary | ICD-10-CM | POA: Diagnosis not present

## 2017-10-01 DIAGNOSIS — E038 Other specified hypothyroidism: Secondary | ICD-10-CM | POA: Diagnosis not present

## 2017-10-01 DIAGNOSIS — M0600 Rheumatoid arthritis without rheumatoid factor, unspecified site: Secondary | ICD-10-CM | POA: Diagnosis not present

## 2017-10-01 DIAGNOSIS — Z79899 Other long term (current) drug therapy: Secondary | ICD-10-CM | POA: Diagnosis not present

## 2017-10-01 DIAGNOSIS — Z853 Personal history of malignant neoplasm of breast: Secondary | ICD-10-CM | POA: Diagnosis not present

## 2017-10-01 DIAGNOSIS — K219 Gastro-esophageal reflux disease without esophagitis: Secondary | ICD-10-CM | POA: Diagnosis not present

## 2017-10-01 DIAGNOSIS — E7800 Pure hypercholesterolemia, unspecified: Secondary | ICD-10-CM | POA: Diagnosis not present

## 2017-10-01 DIAGNOSIS — Z6820 Body mass index (BMI) 20.0-20.9, adult: Secondary | ICD-10-CM | POA: Diagnosis not present

## 2017-10-01 DIAGNOSIS — F3341 Major depressive disorder, recurrent, in partial remission: Secondary | ICD-10-CM | POA: Diagnosis not present

## 2017-10-01 DIAGNOSIS — Z0000 Encounter for general adult medical examination without abnormal findings: Secondary | ICD-10-CM | POA: Diagnosis not present

## 2017-10-01 DIAGNOSIS — J479 Bronchiectasis, uncomplicated: Secondary | ICD-10-CM | POA: Diagnosis not present

## 2017-10-15 DIAGNOSIS — F411 Generalized anxiety disorder: Secondary | ICD-10-CM | POA: Diagnosis not present

## 2017-10-18 DIAGNOSIS — Z1212 Encounter for screening for malignant neoplasm of rectum: Secondary | ICD-10-CM | POA: Diagnosis not present

## 2017-10-21 DIAGNOSIS — N952 Postmenopausal atrophic vaginitis: Secondary | ICD-10-CM | POA: Diagnosis not present

## 2017-10-21 DIAGNOSIS — R311 Benign essential microscopic hematuria: Secondary | ICD-10-CM | POA: Diagnosis not present

## 2017-10-21 DIAGNOSIS — R300 Dysuria: Secondary | ICD-10-CM | POA: Diagnosis not present

## 2017-12-09 ENCOUNTER — Other Ambulatory Visit: Payer: Self-pay | Admitting: Rheumatology

## 2017-12-09 ENCOUNTER — Other Ambulatory Visit: Payer: Self-pay

## 2017-12-09 DIAGNOSIS — Z79899 Other long term (current) drug therapy: Secondary | ICD-10-CM | POA: Diagnosis not present

## 2017-12-09 NOTE — Telephone Encounter (Signed)
Last Visit: 09/24/17 Next Visit: 02/27/18 Labs: 07/30/17 WNL  Patient will update labs this afternoon.   Okay to refill per Dr. Estanislado Pandy

## 2017-12-10 LAB — COMPLETE METABOLIC PANEL WITH GFR
AG RATIO: 1.6 (calc) (ref 1.0–2.5)
ALT: 12 U/L (ref 6–29)
AST: 17 U/L (ref 10–35)
Albumin: 4.2 g/dL (ref 3.6–5.1)
Alkaline phosphatase (APISO): 62 U/L (ref 33–130)
BUN: 13 mg/dL (ref 7–25)
CO2: 33 mmol/L — AB (ref 20–32)
CREATININE: 0.73 mg/dL (ref 0.60–0.93)
Calcium: 9.6 mg/dL (ref 8.6–10.4)
Chloride: 105 mmol/L (ref 98–110)
GFR, EST AFRICAN AMERICAN: 91 mL/min/{1.73_m2} (ref >=60)
GFR, Est Non African American: 79 mL/min/{1.73_m2} (ref >=60)
GLOBULIN: 2.7 g/dL (ref 1.9–3.7)
Glucose, Bld: 82 mg/dL (ref 65–99)
Potassium: 4.2 mmol/L (ref 3.5–5.3)
Sodium: 144 mmol/L (ref 135–146)
TOTAL PROTEIN: 6.9 g/dL (ref 6.1–8.1)
Total Bilirubin: 0.3 mg/dL (ref 0.2–1.2)

## 2017-12-10 LAB — CBC WITH DIFFERENTIAL/PLATELET
BASOS ABS: 42 {cells}/uL (ref 0–200)
BASOS PCT: 0.8 %
EOS ABS: 127 {cells}/uL (ref 15–500)
Eosinophils Relative: 2.4 %
HEMATOCRIT: 39.2 % (ref 35.0–45.0)
HEMOGLOBIN: 13.6 g/dL (ref 11.7–15.5)
LYMPHS ABS: 996 {cells}/uL (ref 850–3900)
MCH: 33.8 pg — AB (ref 27.0–33.0)
MCHC: 34.7 g/dL (ref 32.0–36.0)
MCV: 97.5 fL (ref 80.0–100.0)
MPV: 10.2 fL (ref 7.5–12.5)
Monocytes Relative: 7.9 %
NEUTROS ABS: 3715 {cells}/uL (ref 1500–7800)
Neutrophils Relative %: 70.1 %
Platelets: 211 10*3/uL (ref 140–400)
RBC: 4.02 10*6/uL (ref 3.80–5.10)
RDW: 12.7 % (ref 11.0–15.0)
Total Lymphocyte: 18.8 %
WBC: 5.3 10*3/uL (ref 3.8–10.8)
WBCMIX: 419 {cells}/uL (ref 200–950)

## 2017-12-10 NOTE — Progress Notes (Signed)
Labs are stable.

## 2017-12-24 DIAGNOSIS — Z79899 Other long term (current) drug therapy: Secondary | ICD-10-CM | POA: Diagnosis not present

## 2017-12-24 DIAGNOSIS — H2512 Age-related nuclear cataract, left eye: Secondary | ICD-10-CM | POA: Diagnosis not present

## 2017-12-24 DIAGNOSIS — M069 Rheumatoid arthritis, unspecified: Secondary | ICD-10-CM | POA: Diagnosis not present

## 2018-01-21 DIAGNOSIS — R300 Dysuria: Secondary | ICD-10-CM | POA: Diagnosis not present

## 2018-01-21 DIAGNOSIS — N952 Postmenopausal atrophic vaginitis: Secondary | ICD-10-CM | POA: Diagnosis not present

## 2018-02-06 DIAGNOSIS — R3915 Urgency of urination: Secondary | ICD-10-CM | POA: Diagnosis not present

## 2018-02-06 DIAGNOSIS — R350 Frequency of micturition: Secondary | ICD-10-CM | POA: Diagnosis not present

## 2018-02-13 NOTE — Progress Notes (Deleted)
Office Visit Note  Patient: Megan Porter             Date of Birth: [birth year]           MRN: [id number]             PCP: Haywood Pao, MD Referring: Haywood Pao, MD Visit Date: 02/27/2018 Occupation: @GUAROCC @    Subjective:  No chief complaint on file.   History of Present Illness: Megan Porter is a 77 y.o. female ***   Activities of Daily Living:  Patient reports morning stiffness for *** {minute/hour:19697}.   Patient {ACTIONS;DENIES/REPORTS:21021675::"Denies"} nocturnal pain.  Difficulty dressing/grooming: {ACTIONS;DENIES/REPORTS:21021675::"Denies"} Difficulty climbing stairs: {ACTIONS;DENIES/REPORTS:21021675::"Denies"} Difficulty getting out of chair: {ACTIONS;DENIES/REPORTS:21021675::"Denies"} Difficulty using hands for taps, buttons, cutlery, and/or writing: {ACTIONS;DENIES/REPORTS:21021675::"Denies"}   No Rheumatology ROS completed.   PMFS History:  Patient Active Problem List   Diagnosis Date Noted  . Bronchiectasis without complication ([place]) 66/44/0347  . Primary osteoarthritis of both knees 04/06/2017  . DDD (degenerative disc disease), lumbar history of lumbar surgery 1970 04/06/2017  . Age-related osteoporosis without current pathological fracture 04/06/2017  . History of anxiety 04/06/2017  . History of breast cancer 04/04/2017  . Primary osteoarthritis of both feet 10/02/2016  . Primary osteoarthritis of both hands 10/02/2016  . High risk medications (not anticoagulants) long-term use 10/02/2016  . Rheumatoid arthritis, seronegative, multiple sites ([place]) 10/02/2016    Past Medical History:  Diagnosis Date  . Arthritis   . Cancer ([place])   . Osteoarthritis     Family History  Family history unknown: Yes   Past Surgical History:  Procedure Laterality Date  . BACK SURGERY    . BREAST RECONSTRUCTION     Social History   Social History Narrative  . Not on file     Objective: Vital Signs: There were no vitals taken for this visit.    Physical Exam   Musculoskeletal Exam: ***  CDAI Exam: No CDAI exam completed.    Investigation: No additional findings.PLQ eye exam: 12/24/2017 CBC Latest Ref Rng & Units 12/09/2017 07/30/2017 04/09/2017  WBC 3.8 - 10.8 Thousand/uL 5.3 5.4 5.7  Hemoglobin 11.7 - 15.5 g/dL 13.6 13.7 14.1  Hematocrit 35.0 - 45.0 % 39.2 39.2 42.2  Platelets 140 - 400 Thousand/uL 211 206 218   CMP Latest Ref Rng & Units 12/09/2017 07/30/2017 04/09/2017  Glucose 65 - 99 mg/dL 82 83 67  BUN 7 - 25 mg/dL 13 14 16   Creatinine 0.60 - 0.93 mg/dL 0.73 0.75 1.01(H)  Sodium 135 - 146 mmol/L 144 140 139  Potassium 3.5 - 5.3 mmol/L 4.2 4.6 4.6  Chloride 98 - 110 mmol/L 105 101 102  CO2 20 - 32 mmol/L 33(H) 34(H) 27  Calcium 8.6 - 10.4 mg/dL 9.6 9.8 9.8  Total Protein 6.1 - 8.1 g/dL 6.9 7.0 7.1  Total Bilirubin 0.2 - 1.2 mg/dL 0.3 0.5 0.3  Alkaline Phos 33 - 130 U/L - - 65  AST 10 - 35 U/L 17 18 25   ALT 6 - 29 U/L 12 14 26     Imaging: No results found.  Speciality Comments: PLQ Eye Exam 12/24/17 WNL @ [facility] Opthalmology follow up in 1 year    Procedures:  No procedures performed Allergies: Patient has no known allergies.   Assessment / Plan:     Visit Diagnoses: No diagnosis found.    Orders: No orders of the defined types were placed in this encounter.  No orders of the defined types were placed in this encounter.  Face-to-face time spent with patient was *** minutes. 50% of time was spent in counseling and coordination of care.  Follow-Up Instructions: No follow-ups on file.   Earnestine Mealing, CMA  Note - This record has been created using Editor, commissioning.  Chart creation errors have been sought, but may not always  have been located. Such creation errors do not reflect on  the standard of medical care.

## 2018-02-20 ENCOUNTER — Telehealth: Payer: Self-pay | Admitting: Rheumatology

## 2018-02-20 MED ORDER — HYDROXYCHLOROQUINE SULFATE 200 MG PO TABS: 200.0000 mg | ORAL_TABLET | Freq: Every day | ORAL | 0 refills | Status: DC

## 2018-02-20 NOTE — Telephone Encounter (Signed)
Pharmacy calling to get refills for patients generic Plaquenil. Fax# [phone]

## 2018-02-20 NOTE — Telephone Encounter (Signed)
Last Visit: 09/24/17 Next Visit: 02/27/18 Labs: 12/09/17 stable  PLQ Eye Exam 12/24/17 WNL  Okay to refill per Dr. Estanislado Pandy

## 2018-02-27 ENCOUNTER — Telehealth: Payer: Self-pay | Admitting: Rheumatology

## 2018-02-27 ENCOUNTER — Ambulatory Visit: Payer: Medicare Other | Admitting: Rheumatology

## 2018-02-27 ENCOUNTER — Other Ambulatory Visit: Payer: Self-pay | Admitting: Rheumatology

## 2018-02-27 MED ORDER — HYDROXYCHLOROQUINE SULFATE 200 MG PO TABS: 200.0000 mg | ORAL_TABLET | Freq: Every day | ORAL | 0 refills | Status: DC

## 2018-02-27 NOTE — Telephone Encounter (Signed)
Last Visit: 09/24/17 Next Visit: 02/27/18 Labs: 12/09/17 stable  PLQ Eye Exam 12/24/17 WNL  Okay to refill per Dr. Estanislado Pandy  Patient advised.

## 2018-02-27 NOTE — Telephone Encounter (Signed)
Patient called stating she has not received her prescription of Plaquenil from [facility].  [place] the beach and would like a 14 day supply to be sent to [facility] on the corner of [address] in [place], [place] to hold her over until she returns to [place].  Patient states she has been completely out of her medication for the past 3 days.  [facility] phone # [phone]

## 2018-03-17 NOTE — Progress Notes (Signed)
Office Visit Note  Patient: Megan Porter             Date of Birth: Apr 23, 1941           MRN: [id number]             PCP: Haywood Pao, MD Referring: Haywood Pao, MD Visit Date: 03/28/2018 Occupation: @GUAROCC @    Subjective:  Joint stiffness.   History of Present Illness: Megan Porter is a 77 y.o. female story of seronegative rheumatoid arthritis and osteoarthritis.  She denies any joint swelling or joint inflammation on methotrexate and Plaquenil combination.  She states she has not been very active recently as the weather has been very hot.  Has been experiencing a stiffness in her joints after prolonged sitting.  Continues to have stiffness in her lower back as well.    Activities of Daily Living:  Patient reports morning stiffness for 24 hours.   Patient Denies nocturnal pain.  Difficulty dressing/grooming: Denies Difficulty climbing stairs: Denies Difficulty getting out of chair: Denies Difficulty using hands for taps, buttons, cutlery, and/or writing: Reports   Review of Systems  Constitutional: Positive for fatigue. Negative for night sweats, weight gain and weight loss.  HENT: Negative for mouth sores, trouble swallowing, trouble swallowing, mouth dryness and nose dryness.   Eyes: Negative for pain, redness, visual disturbance and dryness.  Respiratory: Negative for cough, shortness of breath and difficulty breathing.   Cardiovascular: Negative for chest pain, palpitations, hypertension, irregular heartbeat and swelling in legs/feet.  Gastrointestinal: Positive for constipation. Negative for abdominal pain, blood in stool and diarrhea.  Endocrine: Negative for increased urination.  Genitourinary: Negative for pelvic pain and vaginal dryness.  Musculoskeletal: Positive for morning stiffness. Negative for arthralgias, joint pain, joint swelling, myalgias, muscle weakness, muscle tenderness and myalgias.  Skin: Negative for color change, rash, hair loss,  skin tightness, ulcers and sensitivity to sunlight.  Allergic/Immunologic: Negative for susceptible to infections.  Neurological: Negative for dizziness, light-headedness, headaches, memory loss, night sweats and weakness.  Hematological: Negative for bruising/bleeding tendency and swollen glands.  Psychiatric/Behavioral: Negative for depressed mood, confusion and sleep disturbance. The patient is not nervous/anxious.     PMFS History:  Patient Active Problem List   Diagnosis Date Noted  . Bronchiectasis without complication ([place]) 81/19/1478  . Primary osteoarthritis of both knees 04/06/2017  . DDD (degenerative disc disease), lumbar history of lumbar surgery 1970 04/06/2017  . Age-related osteoporosis without current pathological fracture 04/06/2017  . History of anxiety 04/06/2017  . History of breast cancer 04/04/2017  . Primary osteoarthritis of both feet 10/02/2016  . Primary osteoarthritis of both hands 10/02/2016  . High risk medications (not anticoagulants) long-term use 10/02/2016  . Rheumatoid arthritis, seronegative, multiple sites ([place]) 10/02/2016    Past Medical History:  Diagnosis Date  . Arthritis   . Cancer ([place])   . Osteoarthritis     Family History  Family history unknown: Yes   Past Surgical History:  Procedure Laterality Date  . BACK SURGERY    . BREAST RECONSTRUCTION     Social History   Social History Narrative  . Not on file     Objective: Vital Signs: BP 140/71 (BP Location: Left Arm, Patient Position: Sitting, Cuff Size: Normal)   Pulse 70   Resp 12   Ht 5\' 3"  (1.6 m)   Wt 131 lb (59.4 kg)   BMI 23.21 kg/m    Physical Exam  Constitutional: She is oriented to person, place, and time. She  appears well-developed and well-nourished.  HENT:  Head: Normocephalic and atraumatic.  Eyes: Conjunctivae and EOM are normal.  Neck: Normal range of motion.  Cardiovascular: Normal rate, regular rhythm, normal heart sounds and intact distal pulses.    Pulmonary/Chest: Effort normal and breath sounds normal.  Abdominal: Soft. Bowel sounds are normal.  Lymphadenopathy:    She has no cervical adenopathy.  Neurological: She is alert and oriented to person, place, and time.  Skin: Skin is warm and dry. Capillary refill takes less than 2 seconds.  Psychiatric: She has a normal mood and affect. Her behavior is normal.  Nursing note and vitals reviewed.    Musculoskeletal Exam: C-spine thoracic spine good range of motion.  She has some discomfort range of motion of the lumbar spine.  Shoulder joints elbow joints wrist joints MCPs PIPs been good range of motion.  She has DIP and PIP thickening bilaterally consistent with osteoarthritis.  Hip joints knee joints ankles were in good range of motion.  She has some osteoarthritic changes in her feet with no synovitis.  CDAI Exam: CDAI Homunculus Exam:   Joint Counts:  CDAI Tender Joint count: 0 CDAI Swollen Joint count: 0  Global Assessments:  Patient Global Assessment: 3 Provider Global Assessment: 3  CDAI Calculated Score: 6    Investigation: No additional findings.PLQ eye exam: 12/24/2017 CBC Latest Ref Rng & Units 12/09/2017 07/30/2017 04/09/2017  WBC 3.8 - 10.8 Thousand/uL 5.3 5.4 5.7  Hemoglobin 11.7 - 15.5 g/dL 13.6 13.7 14.1  Hematocrit 35.0 - 45.0 % 39.2 39.2 42.2  Platelets 140 - 400 Thousand/uL 211 206 218   CMP Latest Ref Rng & Units 12/09/2017 07/30/2017 04/09/2017  Glucose 65 - 99 mg/dL 82 83 67  BUN 7 - 25 mg/dL 13 14 16   Creatinine 0.60 - 0.93 mg/dL 0.73 0.75 1.01(H)  Sodium 135 - 146 mmol/L 144 140 139  Potassium 3.5 - 5.3 mmol/L 4.2 4.6 4.6  Chloride 98 - 110 mmol/L 105 101 102  CO2 20 - 32 mmol/L 33(H) 34(H) 27  Calcium 8.6 - 10.4 mg/dL 9.6 9.8 9.8  Total Protein 6.1 - 8.1 g/dL 6.9 7.0 7.1  Total Bilirubin 0.2 - 1.2 mg/dL 0.3 0.5 0.3  Alkaline Phos 33 - 130 U/L - - 65  AST 10 - 35 U/L 17 18 25   ALT 6 - 29 U/L 12 14 26     Imaging: No results found.  Speciality  Comments: PLQ Eye Exam 12/24/17 WNL @ [facility] Opthalmology follow up in 1 year    Procedures:  No procedures performed Allergies: Patient has no known allergies.   Assessment / Plan:     Visit Diagnoses: Rheumatoid arthritis, seronegative, multiple sites ([place]) - erosive disease neg RF,CCP, and ANA.  Patient is clinically doing well with no synovitis on examination.  She has been experiencing increasing stiffness which I believe is related to osteoarthritis.  Need for regular exercise was emphasized.  High risk medications (not anticoagulants) long-term use - Plaquenil 200 mg po qd and Methotrexate 4 tabs po qd, Folic acid 1 mg po qdeye exam.eye exam: 12/24/2017 - Plan: CBC with Differential/Platelet, COMPLETE METABOLIC PANEL WITH GFR today and then every 3 months.  Patient states her eye exam has been up-to-date. Primary osteoarthritis of both hands-joint protection muscle strengthening discussed.  Primary osteoarthritis of both knees-she has been having some knee joint stiffness.  Have given her a handout on knee exercises.  I offered physical therapy but she declined.  Primary osteoarthritis of both feet-she has  minimal discomfort in her feet currently.  DDD (degenerative disc disease), lumbar history of lumbar surgery 1970-she has chronic pain.  Age-related osteoporosis without current pathological fracture - [place] . Her gynecologist monitors her bone density.   History of breast cancer  History of anxiety    Orders: Orders Placed This Encounter  Procedures  . CBC with Differential/Platelet  . COMPLETE METABOLIC PANEL WITH GFR   No orders of the defined types were placed in this encounter.   Face-to-face time spent with patient was 30 minutes. Greater than 50% of time was spent in counseling and coordination of care.  Follow-Up Instructions: Return in about 5 months (around 08/28/2018) for Rheumatoid arthritis, Osteoarthritis, Osteoporosis.   Bo Merino,  MD  Note - This record has been created using Editor, commissioning.  Chart creation errors have been sought, but may not always  have been located. Such creation errors do not reflect on  the standard of medical care.

## 2018-03-27 DIAGNOSIS — R300 Dysuria: Secondary | ICD-10-CM | POA: Diagnosis not present

## 2018-03-27 DIAGNOSIS — R3915 Urgency of urination: Secondary | ICD-10-CM | POA: Diagnosis not present

## 2018-03-27 DIAGNOSIS — R350 Frequency of micturition: Secondary | ICD-10-CM | POA: Diagnosis not present

## 2018-03-27 DIAGNOSIS — B373 Candidiasis of vulva and vagina: Secondary | ICD-10-CM | POA: Diagnosis not present

## 2018-03-28 ENCOUNTER — Encounter: Payer: Self-pay | Admitting: Rheumatology

## 2018-03-28 ENCOUNTER — Ambulatory Visit (INDEPENDENT_AMBULATORY_CARE_PROVIDER_SITE_OTHER): Payer: Medicare Other | Admitting: Rheumatology

## 2018-03-28 VITALS — BP 140/71 | HR 70 | Resp 12 | Ht 63.0 in | Wt 131.0 lb

## 2018-03-28 DIAGNOSIS — Z8659 Personal history of other mental and behavioral disorders: Secondary | ICD-10-CM | POA: Diagnosis not present

## 2018-03-28 DIAGNOSIS — M19071 Primary osteoarthritis, right ankle and foot: Secondary | ICD-10-CM | POA: Diagnosis not present

## 2018-03-28 DIAGNOSIS — M19072 Primary osteoarthritis, left ankle and foot: Secondary | ICD-10-CM

## 2018-03-28 DIAGNOSIS — Z853 Personal history of malignant neoplasm of breast: Secondary | ICD-10-CM | POA: Diagnosis not present

## 2018-03-28 DIAGNOSIS — M0609 Rheumatoid arthritis without rheumatoid factor, multiple sites: Secondary | ICD-10-CM

## 2018-03-28 DIAGNOSIS — M19041 Primary osteoarthritis, right hand: Secondary | ICD-10-CM

## 2018-03-28 DIAGNOSIS — Z79899 Other long term (current) drug therapy: Secondary | ICD-10-CM

## 2018-03-28 DIAGNOSIS — M810 Age-related osteoporosis without current pathological fracture: Secondary | ICD-10-CM

## 2018-03-28 DIAGNOSIS — M19042 Primary osteoarthritis, left hand: Secondary | ICD-10-CM | POA: Diagnosis not present

## 2018-03-28 DIAGNOSIS — M170 Bilateral primary osteoarthritis of knee: Secondary | ICD-10-CM

## 2018-03-28 DIAGNOSIS — M5136 Other intervertebral disc degeneration, lumbar region: Secondary | ICD-10-CM

## 2018-03-28 LAB — CBC WITH DIFFERENTIAL/PLATELET
Basophils Absolute: 38 cells/uL (ref 0–200)
Basophils Relative: 0.8 %
EOS PCT: 1.5 %
Eosinophils Absolute: 72 cells/uL (ref 15–500)
HEMATOCRIT: 38.2 % (ref 35.0–45.0)
HEMOGLOBIN: 13.1 g/dL (ref 11.7–15.5)
LYMPHS ABS: 907 {cells}/uL (ref 850–3900)
MCH: 33.7 pg — ABNORMAL HIGH (ref 27.0–33.0)
MCHC: 34.3 g/dL (ref 32.0–36.0)
MCV: 98.2 fL (ref 80.0–100.0)
MPV: 11.6 fL (ref 7.5–12.5)
Monocytes Relative: 7.8 %
NEUTROS ABS: 3408 {cells}/uL (ref 1500–7800)
Neutrophils Relative %: 71 %
Platelets: 180 10*3/uL (ref 140–400)
RBC: 3.89 10*6/uL (ref 3.80–5.10)
RDW: 12.6 % (ref 11.0–15.0)
Total Lymphocyte: 18.9 %
WBC mixed population: 374 cells/uL (ref 200–950)
WBC: 4.8 10*3/uL (ref 3.8–10.8)

## 2018-03-28 LAB — COMPLETE METABOLIC PANEL WITHOUT GFR
AG Ratio: 1.6 (calc) (ref 1.0–2.5)
ALT: 15 U/L (ref 6–29)
AST: 21 U/L (ref 10–35)
Albumin: 4.2 g/dL (ref 3.6–5.1)
Alkaline phosphatase (APISO): 59 U/L (ref 33–130)
BUN: 18 mg/dL (ref 7–25)
CO2: 26 mmol/L (ref 20–32)
Calcium: 9.4 mg/dL (ref 8.6–10.4)
Chloride: 104 mmol/L (ref 98–110)
Creat: 0.77 mg/dL (ref 0.60–0.93)
GFR, Est African American: 85 mL/min/{1.73_m2}
GFR, Est Non African American: 73 mL/min/{1.73_m2}
Globulin: 2.6 g/dL (ref 1.9–3.7)
Glucose, Bld: 90 mg/dL (ref 65–99)
Potassium: 5.1 mmol/L (ref 3.5–5.3)
Sodium: 140 mmol/L (ref 135–146)
Total Bilirubin: 0.6 mg/dL (ref 0.2–1.2)
Total Protein: 6.8 g/dL (ref 6.1–8.1)

## 2018-03-28 NOTE — Patient Instructions (Addendum)
Knee Exercises Ask your health care provider which exercises are safe for you. Do exercises exactly as told by your health care provider and adjust them as directed. It is normal to feel mild stretching, pulling, tightness, or discomfort as you do these exercises, but you should stop right away if you feel sudden pain or your pain gets worse.Do not begin these exercises until told by your health care provider. STRETCHING AND RANGE OF MOTION EXERCISES These exercises warm up your muscles and joints and improve the movement and flexibility of your knee. These exercises also help to relieve pain, numbness, and tingling. Exercise A: Knee Extension, Prone 1. Lie on your abdomen on a bed. 2. Place your left / right knee just beyond the edge of the surface so your knee is not on the bed. You can put a towel under your left / right thigh just above your knee for comfort. 3. Relax your leg muscles and allow gravity to straighten your knee. You should feel a stretch behind your left / right knee. 4. Hold this position for __________ seconds. 5. Scoot up so your knee is supported between repetitions. Repeat __________ times. Complete this stretch __________ times a day. Exercise B: Knee Flexion, Active  1. Lie on your back with both knees straight. If this causes back discomfort, bend your left / right knee so your foot is flat on the floor. 2. Slowly slide your left / right heel back toward your buttocks until you feel a gentle stretch in the front of your knee or thigh. 3. Hold this position for __________ seconds. 4. Slowly slide your left / right heel back to the starting position. Repeat __________ times. Complete this exercise __________ times a day. Exercise C: Quadriceps, Prone  1. Lie on your abdomen on a firm surface, such as a bed or padded floor. 2. Bend your left / right knee and hold your ankle. If you cannot reach your ankle or pant leg, loop a belt around your foot and grab the belt  instead. 3. Gently pull your heel toward your buttocks. Your knee should not slide out to the side. You should feel a stretch in the front of your thigh and knee. 4. Hold this position for __________ seconds. Repeat __________ times. Complete this stretch __________ times a day. Exercise D: Hamstring, Supine 1. Lie on your back. 2. Loop a belt or towel over the ball of your left / right foot. The ball of your foot is on the walking surface, right under your toes. 3. Straighten your left / right knee and slowly pull on the belt to raise your leg until you feel a gentle stretch behind your knee. ? Do not let your left / right knee bend while you do this. ? Keep your other leg flat on the floor. 4. Hold this position for __________ seconds. Repeat __________ times. Complete this stretch __________ times a day. STRENGTHENING EXERCISES These exercises build strength and endurance in your knee. Endurance is the ability to use your muscles for a long time, even after they get tired. Exercise E: Quadriceps, Isometric  1. Lie on your back with your left / right leg extended and your other knee bent. Put a rolled towel or small pillow under your knee if told by your health care provider. 2. Slowly tense the muscles in the front of your left / right thigh. You should see your kneecap slide up toward your hip or see increased dimpling just above the knee. This   motion will push the back of the knee toward the floor. 3. For __________ seconds, keep the muscle as tight as you can without increasing your pain. 4. Relax the muscles slowly and completely. Repeat __________ times. Complete this exercise __________ times a day. Exercise F: Straight Leg Raises - Quadriceps 1. Lie on your back with your left / right leg extended and your other knee bent. 2. Tense the muscles in the front of your left / right thigh. You should see your kneecap slide up or see increased dimpling just above the knee. Your thigh may  even shake a bit. 3. Keep these muscles tight as you raise your leg 4-6 inches (10-15 cm) off the floor. Do not let your knee bend. 4. Hold this position for __________ seconds. 5. Keep these muscles tense as you lower your leg. 6. Relax your muscles slowly and completely after each repetition. Repeat __________ times. Complete this exercise __________ times a day. Exercise G: Hamstring, Isometric 1. Lie on your back on a firm surface. 2. Bend your left / right knee approximately __________ degrees. 3. Dig your left / right heel into the surface as if you are trying to pull it toward your buttocks. Tighten the muscles in the back of your thighs to dig as hard as you can without increasing any pain. 4. Hold this position for __________ seconds. 5. Release the tension gradually and allow your muscles to relax completely for __________ seconds after each repetition. Repeat __________ times. Complete this exercise __________ times a day. Exercise H: Hamstring Curls  If told by your health care provider, do this exercise while wearing ankle weights. Begin with __________ weights. Then increase the weight by 1 lb (0.5 kg) increments. Do not wear ankle weights that are more than __________. 1. Lie on your abdomen with your legs straight. 2. Bend your left / right knee as far as you can without feeling pain. Keep your hips flat against the floor. 3. Hold this position for __________ seconds. 4. Slowly lower your leg to the starting position.  Repeat __________ times. Complete this exercise __________ times a day. Exercise I: Squats (Quadriceps) 1. Stand in front of a table, with your feet and knees pointing straight ahead. You may rest your hands on the table for balance but not for support. 2. Slowly bend your knees and lower your hips like you are going to sit in a chair. ? Keep your weight over your heels, not over your toes. ? Keep your lower legs upright so they are parallel with the table  legs. ? Do not let your hips go lower than your knees. ? Do not bend lower than told by your health care provider. ? If your knee pain increases, do not bend as low. 3. Hold the squat position for __________ seconds. 4. Slowly push with your legs to return to standing. Do not use your hands to pull yourself to standing. Repeat __________ times. Complete this exercise __________ times a day. Exercise J: Wall Slides (Quadriceps)  1. Lean your back against a smooth wall or door while you walk your feet out 18-24 inches (46-61 cm) from it. 2. Place your feet hip-width apart. 3. Slowly slide down the wall or door until your knees bend __________ degrees. Keep your knees over your heels, not over your toes. Keep your knees in line with your hips. 4. Hold for __________ seconds. Repeat __________ times. Complete this exercise __________ times a day. Exercise K: Straight Leg Raises -   Hip Abductors 1. Lie on your side with your left / right leg in the top position. Lie so your head, shoulder, knee, and hip line up. You may bend your bottom knee to help you keep your balance. 2. Roll your hips slightly forward so your hips are stacked directly over each other and your left / right knee is facing forward. 3. Leading with your heel, lift your top leg 4-6 inches (10-15 cm). You should feel the muscles in your outer hip lifting. ? Do not let your foot drift forward. ? Do not let your knee roll toward the ceiling. 4. Hold this position for __________ seconds. 5. Slowly return your leg to the starting position. 6. Let your muscles relax completely after each repetition. Repeat __________ times. Complete this exercise __________ times a day. Exercise L: Straight Leg Raises - Hip Extensors 1. Lie on your abdomen on a firm surface. You can put a pillow under your hips if that is more comfortable. 2. Tense the muscles in your buttocks and lift your left / right leg about 4-6 inches (10-15 cm). Keep your knee  straight as you lift your leg. 3. Hold this position for __________ seconds. 4. Slowly lower your leg to the starting position. 5. Let your leg relax completely after each repetition. Repeat __________ times. Complete this exercise __________ times a day. This information is not intended to replace advice given to you by your health care provider. Make sure you discuss any questions you have with your health care provider. Document Released: 07/18/2005 Document Revised: 05/28/2016 Document Reviewed: 07/10/2015 Elsevier Interactive Patient Education  2018 [place] We placed an order today for your standing lab work.    Please come back and get your standing labs in October and every 3 months  We have open lab Monday through Friday from 8:30-11:30 AM and 1:30-4:00 PM  at the office of Dr. Bo Merino.   You may experience shorter wait times on Monday and Friday afternoons. The office is located at [address], [place], [place], [place] [address] No appointment is necessary.   Labs are drawn by [facility].  You may receive a bill from [facility] for your lab work. If you have any questions regarding directions or hours of operation,  please call [phone].

## 2018-03-31 ENCOUNTER — Other Ambulatory Visit: Payer: Self-pay | Admitting: Rheumatology

## 2018-04-02 NOTE — Telephone Encounter (Signed)
Last Visit: 03/28/18 Next Visit: 08/01/18 Labs: 03/28/18 CMP WNL. CBC stable.  Okay to refill per Dr. Estanislado Pandy

## 2018-05-13 DIAGNOSIS — R351 Nocturia: Secondary | ICD-10-CM | POA: Diagnosis not present

## 2018-05-13 DIAGNOSIS — N952 Postmenopausal atrophic vaginitis: Secondary | ICD-10-CM | POA: Diagnosis not present

## 2018-05-13 DIAGNOSIS — R3915 Urgency of urination: Secondary | ICD-10-CM | POA: Diagnosis not present

## 2018-05-13 DIAGNOSIS — R350 Frequency of micturition: Secondary | ICD-10-CM | POA: Diagnosis not present

## 2018-06-24 ENCOUNTER — Other Ambulatory Visit: Payer: Self-pay | Admitting: Rheumatology

## 2018-06-25 NOTE — Telephone Encounter (Signed)
Last Visit: 03/28/18 Next Visit: 08/01/18 Labs: 03/28/18 CMP WNL. CBC stable PLQ Eye Exam 12/24/17 WNL  Okay to refill per Dr. Estanislado Pandy

## 2018-07-18 NOTE — Progress Notes (Signed)
Office Visit Note  Patient: Megan Porter             Date of Birth: 14-Jan-1941           MRN: [id number]             PCP: Haywood Pao, MD Referring: Haywood Pao, MD Visit Date: 08/01/2018 Occupation: @GUAROCC @  Subjective:  Medication monitoring    History of Present Illness: Megan Porter is a 77 y.o. female  history of seronegative rheumatoid arthritis, osteoarthritis and osteoporosis.  Patient is on Plaquenil 200 mg 1 tablet by mouth daily and methotrexate 4 tablets by mouth once weekly.  She denies any recent rheumatoid arthritis flares.  She denies any joint pain or joint swelling at this time.  She states that she is typically stiff for about 30 minutes in the morning.  She denies missing any doses of her medications recently.  She denies any recent infections.  She reports that she got the yearly influenza vaccine.  She states she will occasionally have discomfort in bilateral knee joints when she was going up and down steps.  She states that at times she will have increased discomfort and intermittent swelling in her ankle joints especially with weather changes but she is asymptomatic at this time.  She denies any lower back pain or if symptoms of sciatica at this time.    Activities of Daily Living:  Patient reports morning stiffness for 30 minutes.   Patient Denies nocturnal pain.  Difficulty dressing/grooming: Denies Difficulty climbing stairs: Denies Difficulty getting out of chair: Denies Difficulty using hands for taps, buttons, cutlery, and/or writing: Reports  Review of Systems  Constitutional: Negative for fatigue.  HENT: Negative for mouth sores, mouth dryness and nose dryness.   Eyes: Negative for pain, visual disturbance and dryness.  Respiratory: Negative for cough, hemoptysis, shortness of breath and difficulty breathing.   Cardiovascular: Negative for chest pain, palpitations, hypertension and swelling in legs/feet.  Gastrointestinal: Negative for  blood in stool, constipation and diarrhea.  Endocrine: Negative for increased urination.  Genitourinary: Negative for difficulty urinating and painful urination.  Musculoskeletal: Positive for arthralgias, joint pain, joint swelling and morning stiffness. Negative for myalgias, muscle weakness, muscle tenderness and myalgias.  Skin: Negative for color change, pallor, rash, hair loss, nodules/bumps, skin tightness, ulcers and sensitivity to sunlight.  Allergic/Immunologic: Negative for susceptible to infections.  Neurological: Negative for dizziness, numbness, headaches and weakness.  Hematological: Negative for swollen glands.  Psychiatric/Behavioral: Negative for depressed mood and sleep disturbance. The patient is not nervous/anxious.     PMFS History:  Patient Active Problem List   Diagnosis Date Noted  . Bronchiectasis without complication ([place]) 03/16/1600  . Primary osteoarthritis of both knees 04/06/2017  . DDD (degenerative disc disease), lumbar history of lumbar surgery 1970 04/06/2017  . Age-related osteoporosis without current pathological fracture 04/06/2017  . History of anxiety 04/06/2017  . History of breast cancer 04/04/2017  . Primary osteoarthritis of both feet 10/02/2016  . Primary osteoarthritis of both hands 10/02/2016  . High risk medications (not anticoagulants) long-term use 10/02/2016  . Rheumatoid arthritis, seronegative, multiple sites ([place]) 10/02/2016    Past Medical History:  Diagnosis Date  . Arthritis   . Cancer ([place])   . Osteoarthritis     Family History  Family history unknown: Yes   Past Surgical History:  Procedure Laterality Date  . BACK SURGERY    . BREAST RECONSTRUCTION     Social History   Social History Narrative  .  Not on file    Objective: Vital Signs: BP (!) 146/67 (BP Location: Left Arm, Patient Position: Sitting, Cuff Size: Normal)   Pulse 72   Ht 5\' 3"  (1.6 m)   Wt 128 lb 12.8 oz (58.4 kg)   BMI 22.82 kg/m    Physical  Exam  Constitutional: She is oriented to person, place, and time. She appears well-developed and well-nourished.  HENT:  Head: Normocephalic and atraumatic.  Eyes: Conjunctivae and EOM are normal.  Neck: Normal range of motion.  Cardiovascular: Normal rate, regular rhythm, normal heart sounds and intact distal pulses.  Pulmonary/Chest: Effort normal and breath sounds normal.  Abdominal: Soft. Bowel sounds are normal.  Lymphadenopathy:    She has no cervical adenopathy.  Neurological: She is alert and oriented to person, place, and time.  Skin: Skin is warm and dry. Capillary refill takes less than 2 seconds.  Psychiatric: She has a normal mood and affect. Her behavior is normal.  Nursing note and vitals reviewed.    Musculoskeletal Exam: C-spine limited range of motion with lateral rotation.  Thoracic lumbar spine good range of motion.  No midline spinal tenderness.  No SI joint tenderness.  Shoulder joints, elbow joints, wrist joints, MCPs and PIPs and DIPs good range of motion with no synovitis.  She has PIP and DIP synovial thickening consistent with osteoarthritis of bilateral hands.  She has bilateral CMC joint synovial thickening.  Hip joints, knee joints and ankle joints good range of motion with no discomfort.  No warmth or effusion bilateral knee joints.  No tenderness or swelling of ankle joints.  No tenderness of trochanteric bursa bilaterally.  CDAI Exam: CDAI Score: 0.8  Patient Global Assessment: 4 (mm); Provider Global Assessment: 4 (mm) Swollen: 0 ; Tender: 0  Joint Exam   Not documented   There is currently no information documented on the homunculus. Go to the Rheumatology activity and complete the homunculus joint exam.  Investigation: No additional findings.  Imaging: No results found.  Recent Labs: Lab Results  Component Value Date   WBC 4.8 03/28/2018   HGB 13.1 03/28/2018   PLT 180 03/28/2018   NA 140 03/28/2018   K 5.1 03/28/2018   CL 104 03/28/2018    CO2 26 03/28/2018   GLUCOSE 90 03/28/2018   BUN 18 03/28/2018   CREATININE 0.77 03/28/2018   BILITOT 0.6 03/28/2018   ALKPHOS 65 04/09/2017   AST 21 03/28/2018   ALT 15 03/28/2018   PROT 6.8 03/28/2018   ALBUMIN 4.4 04/09/2017   CALCIUM 9.4 03/28/2018   GFRAA 85 03/28/2018    Speciality Comments: PLQ Eye Exam 12/24/17 WNL @ [facility] follow up in 1 year  Procedures:  No procedures performed Allergies: Patient has no known allergies.     Assessment / Plan:     Visit Diagnoses: Rheumatoid arthritis, seronegative, multiple sites ([place]) - erosive disease neg RF,CCP, and ANA: She has no synovitis on exam.  She is clinically doing well on methotrexate 4 tablets by mouth once weekly and Plaquenil 200 mg 1 tablet by mouth daily.  She has not had any recent rheumatoid arthritis flares.  She has no joint pain or joint swelling at this time.  She has some difficulty going up and down steps due to having some discomfort in knee joints but she has no discomfort at this time.  She has intermittent discomfort and swelling in bilateral ankle joints which she attributes to weather changes and activity level.  She will continue taking  methotrexate 4 tablets by mouth once weekly and Plaquenil 200 mg 1 tablet by mouth daily.  A refill of methotrexate will be sent to the pharmacy today.  She was advised to notify us if she has increased joint pain or joint swelling.  She will follow-up in the office in 5 months.   High risk medications (not anticoagulants) long-term use - Plaquenil 200 mg po qd and Methotrexate 4 tabs po qd, Folic acid 1 mg po qd.eye exam: 12/24/2017. CBC and CMP were drawn today to monitor for drug toxicity.  She will return in February and every 3 months for lab work to monitor for drug toxicity.  Standing orders are in place. She has a yearly influenza vaccination.- Plan: CBC with Differential/Platelet, COMPLETE METABOLIC PANEL WITH GFR  Primary osteoarthritis of both hands: She  has PIP and DIP synovial thickening consistent with osteoarthritis of bilateral hands.  She has bilateral CMC joint synovial thickening.  She has complete fist formation bilaterally.  No synovitis noted.  Joint protection and muscle strengthening were discussed.  Primary osteoarthritis of both knees: No warmth or effusion.  Good ROM with no discomfort.  She has some discomfort when going up and down steps.   Primary osteoarthritis of both feet: She has osteoarthritic changes in both feet.  She has no discomfort at this time.  She wears proper fitting shoes.   DDD (degenerative disc disease), lumbar history of lumbar surgery 1970: She has no midline spinal tenderness or discomfort at this time.  She has no symptoms of sciatica at this time.   Age-related osteoporosis without current pathological fracture - She is on Evista . Her gynecologist monitors her bone density.   Other medical conditions are listed as follows:   History of anxiety  History of breast cancer   Orders: Orders Placed This Encounter  Procedures  . CBC with Differential/Platelet  . COMPLETE METABOLIC PANEL WITH GFR   No orders of the defined types were placed in this encounter.     Follow-Up Instructions: Return in about 5 months (around 12/31/2018) for Rheumatoid arthritis, Osteoarthritis, DDD, Osteoporosis.   Ofilia Neas, PA-C  I examined and evaluated the patient with Megan Sams PA.  She had no synovitis on my examination.  She has been tolerating her medications well.  Fortuner joints due to underlying osteoarthritis.  Joint protection muscle strengthening was discussed.  She does have the plan of care was discussed as noted above.  Bo Merino, MD  Note - This record has been created using Editor, commissioning.  Chart creation errors have been sought, but may not always  have been located. Such creation errors do not reflect on  the standard of medical care.

## 2018-07-23 DIAGNOSIS — H4312 Vitreous hemorrhage, left eye: Secondary | ICD-10-CM | POA: Diagnosis not present

## 2018-07-24 DIAGNOSIS — H4312 Vitreous hemorrhage, left eye: Secondary | ICD-10-CM | POA: Diagnosis not present

## 2018-07-24 DIAGNOSIS — H43813 Vitreous degeneration, bilateral: Secondary | ICD-10-CM | POA: Diagnosis not present

## 2018-07-24 DIAGNOSIS — H35371 Puckering of macula, right eye: Secondary | ICD-10-CM | POA: Diagnosis not present

## 2018-07-30 DIAGNOSIS — Z23 Encounter for immunization: Secondary | ICD-10-CM | POA: Diagnosis not present

## 2018-08-01 ENCOUNTER — Ambulatory Visit (INDEPENDENT_AMBULATORY_CARE_PROVIDER_SITE_OTHER): Payer: Medicare Other | Admitting: Rheumatology

## 2018-08-01 ENCOUNTER — Encounter: Payer: Self-pay | Admitting: Rheumatology

## 2018-08-01 VITALS — BP 146/67 | HR 72 | Ht 63.0 in | Wt 128.8 lb

## 2018-08-01 DIAGNOSIS — Z79899 Other long term (current) drug therapy: Secondary | ICD-10-CM

## 2018-08-01 DIAGNOSIS — M19041 Primary osteoarthritis, right hand: Secondary | ICD-10-CM | POA: Diagnosis not present

## 2018-08-01 DIAGNOSIS — M5136 Other intervertebral disc degeneration, lumbar region: Secondary | ICD-10-CM | POA: Diagnosis not present

## 2018-08-01 DIAGNOSIS — Z8659 Personal history of other mental and behavioral disorders: Secondary | ICD-10-CM

## 2018-08-01 DIAGNOSIS — M19042 Primary osteoarthritis, left hand: Secondary | ICD-10-CM

## 2018-08-01 DIAGNOSIS — M810 Age-related osteoporosis without current pathological fracture: Secondary | ICD-10-CM | POA: Diagnosis not present

## 2018-08-01 DIAGNOSIS — M19071 Primary osteoarthritis, right ankle and foot: Secondary | ICD-10-CM | POA: Diagnosis not present

## 2018-08-01 DIAGNOSIS — M0609 Rheumatoid arthritis without rheumatoid factor, multiple sites: Secondary | ICD-10-CM

## 2018-08-01 DIAGNOSIS — M170 Bilateral primary osteoarthritis of knee: Secondary | ICD-10-CM | POA: Diagnosis not present

## 2018-08-01 DIAGNOSIS — Z853 Personal history of malignant neoplasm of breast: Secondary | ICD-10-CM | POA: Diagnosis not present

## 2018-08-01 DIAGNOSIS — M19072 Primary osteoarthritis, left ankle and foot: Secondary | ICD-10-CM | POA: Diagnosis not present

## 2018-08-01 LAB — COMPLETE METABOLIC PANEL WITH GFR
AG RATIO: 1.4 (calc) (ref 1.0–2.5)
ALBUMIN MSPROF: 4.3 g/dL (ref 3.6–5.1)
ALT: 15 U/L (ref 6–29)
AST: 20 U/L (ref 10–35)
Alkaline phosphatase (APISO): 58 U/L (ref 33–130)
BUN: 10 mg/dL (ref 7–25)
CO2: 32 mmol/L (ref 20–32)
CREATININE: 0.76 mg/dL (ref 0.60–0.93)
Calcium: 9.6 mg/dL (ref 8.6–10.4)
Chloride: 104 mmol/L (ref 98–110)
GFR, EST AFRICAN AMERICAN: 86 mL/min/{1.73_m2} (ref >=60)
GFR, EST NON AFRICAN AMERICAN: 75 mL/min/{1.73_m2} (ref >=60)
Globulin: 3 g/dL (calc) (ref 1.9–3.7)
Glucose, Bld: 86 mg/dL (ref 65–99)
POTASSIUM: 5.4 mmol/L — AB (ref 3.5–5.3)
SODIUM: 143 mmol/L (ref 135–146)
TOTAL PROTEIN: 7.3 g/dL (ref 6.1–8.1)
Total Bilirubin: 0.5 mg/dL (ref 0.2–1.2)

## 2018-08-01 LAB — CBC WITH DIFFERENTIAL/PLATELET
BASOS PCT: 0.8 %
Basophils Absolute: 42 cells/uL (ref 0–200)
EOS PCT: 1.5 %
Eosinophils Absolute: 78 cells/uL (ref 15–500)
HCT: 40.2 % (ref 35.0–45.0)
HEMOGLOBIN: 13.8 g/dL (ref 11.7–15.5)
LYMPHS ABS: 1024 {cells}/uL (ref 850–3900)
MCH: 33.2 pg — ABNORMAL HIGH (ref 27.0–33.0)
MCHC: 34.3 g/dL (ref 32.0–36.0)
MCV: 96.6 fL (ref 80.0–100.0)
MPV: 10.6 fL (ref 7.5–12.5)
Monocytes Relative: 7.5 %
NEUTROS ABS: 3666 {cells}/uL (ref 1500–7800)
NEUTROS PCT: 70.5 %
Platelets: 210 10*3/uL (ref 140–400)
RBC: 4.16 10*6/uL (ref 3.80–5.10)
RDW: 12.4 % (ref 11.0–15.0)
Total Lymphocyte: 19.7 %
WBC: 5.2 10*3/uL (ref 3.8–10.8)
WBCMIX: 390 {cells}/uL (ref 200–950)

## 2018-08-01 MED ORDER — METHOTREXATE 2.5 MG PO TABS: ORAL_TABLET | ORAL | 0 refills | Status: DC

## 2018-08-01 NOTE — Patient Instructions (Signed)
Standing Labs We placed an order today for your standing lab work.    Please come back and get your standing labs in February and every 3 month    We have open lab Monday through Friday from 8:30-11:30 AM and 1:30-4:00 PM  at the office of Dr. Bo Merino.   You may experience shorter wait times on Monday and Friday afternoons. The office is located at [address] [place], [place], [place] [address] No appointment is necessary.   Labs are drawn by [facility].  You may receive a bill from [facility] for your lab work. If you have any questions regarding directions or hours of operation,  please call [phone].   Just as a reminder please drink plenty of water prior to coming for your lab work. Thanks!

## 2018-08-04 NOTE — Progress Notes (Signed)
CBC stable. Potassium borderline elevated. Please advise patient to avoid taking a potassium supplement. Forward labs to PCP.

## 2018-08-05 ENCOUNTER — Telehealth: Payer: Self-pay | Admitting: Rheumatology

## 2018-08-05 NOTE — Telephone Encounter (Signed)
Patient left a voicemail stating she was returning your call regarding her labwork results. 

## 2018-08-06 NOTE — Telephone Encounter (Signed)
Patient advised CBC stable. Potassium borderline elevated.patient advised to avoid taking a potassium supplement. Patient verbalized understanding and copy sent to PCP.

## 2018-08-27 DIAGNOSIS — H43812 Vitreous degeneration, left eye: Secondary | ICD-10-CM | POA: Diagnosis not present

## 2018-08-27 DIAGNOSIS — H35371 Puckering of macula, right eye: Secondary | ICD-10-CM | POA: Diagnosis not present

## 2018-08-27 DIAGNOSIS — H4312 Vitreous hemorrhage, left eye: Secondary | ICD-10-CM | POA: Diagnosis not present

## 2018-08-27 DIAGNOSIS — H43822 Vitreomacular adhesion, left eye: Secondary | ICD-10-CM | POA: Diagnosis not present

## 2018-08-27 DIAGNOSIS — Z79899 Other long term (current) drug therapy: Secondary | ICD-10-CM | POA: Diagnosis not present

## 2018-09-21 ENCOUNTER — Inpatient Hospital Stay (HOSPITAL_COMMUNITY)
Admission: EM | Admit: 2018-09-21 | Discharge: 2018-09-27 | DRG: 871 | Disposition: A | Discharge status: Home / Self Care | Payer: Medicare Other | Attending: Internal Medicine | Admitting: Internal Medicine

## 2018-09-21 ENCOUNTER — Emergency Department (HOSPITAL_COMMUNITY): Payer: Medicare Other

## 2018-09-21 ENCOUNTER — Other Ambulatory Visit: Payer: Self-pay

## 2018-09-21 ENCOUNTER — Encounter (HOSPITAL_COMMUNITY): Payer: Self-pay

## 2018-09-21 DIAGNOSIS — Z79899 Other long term (current) drug therapy: Secondary | ICD-10-CM

## 2018-09-21 DIAGNOSIS — Z853 Personal history of malignant neoplasm of breast: Secondary | ICD-10-CM | POA: Diagnosis not present

## 2018-09-21 DIAGNOSIS — E039 Hypothyroidism, unspecified: Secondary | ICD-10-CM | POA: Diagnosis present

## 2018-09-21 DIAGNOSIS — J9601 Acute respiratory failure with hypoxia: Secondary | ICD-10-CM | POA: Diagnosis present

## 2018-09-21 DIAGNOSIS — Z79890 Hormone replacement therapy (postmenopausal): Secondary | ICD-10-CM

## 2018-09-21 DIAGNOSIS — M0609 Rheumatoid arthritis without rheumatoid factor, multiple sites: Secondary | ICD-10-CM | POA: Diagnosis present

## 2018-09-21 DIAGNOSIS — A419 Sepsis, unspecified organism: Principal | ICD-10-CM | POA: Diagnosis present

## 2018-09-21 DIAGNOSIS — J189 Pneumonia, unspecified organism: Secondary | ICD-10-CM | POA: Diagnosis not present

## 2018-09-21 DIAGNOSIS — R0602 Shortness of breath: Secondary | ICD-10-CM

## 2018-09-21 DIAGNOSIS — D72829 Elevated white blood cell count, unspecified: Secondary | ICD-10-CM | POA: Diagnosis present

## 2018-09-21 DIAGNOSIS — J9811 Atelectasis: Secondary | ICD-10-CM | POA: Diagnosis present

## 2018-09-21 DIAGNOSIS — I10 Essential (primary) hypertension: Secondary | ICD-10-CM | POA: Diagnosis present

## 2018-09-21 DIAGNOSIS — E876 Hypokalemia: Secondary | ICD-10-CM | POA: Diagnosis present

## 2018-09-21 DIAGNOSIS — M810 Age-related osteoporosis without current pathological fracture: Secondary | ICD-10-CM | POA: Diagnosis present

## 2018-09-21 DIAGNOSIS — J181 Lobar pneumonia, unspecified organism: Secondary | ICD-10-CM | POA: Diagnosis not present

## 2018-09-21 LAB — CBC WITH DIFFERENTIAL/PLATELET
Band Neutrophils: 0 %
Basophils Absolute: 0.2 10*3/uL — ABNORMAL HIGH (ref 0.0–0.1)
Basophils Relative: 1 %
Blasts: 0 %
Eosinophils Absolute: 0.2 10*3/uL (ref 0.0–0.5)
Eosinophils Relative: 1 %
HCT: 37.1 % (ref 36.0–46.0)
Hemoglobin: 12.1 g/dL (ref 12.0–15.0)
Lymphocytes Relative: 4 %
Lymphs Abs: 0.7 10*3/uL (ref 0.7–4.0)
MCH: 33.5 pg (ref 26.0–34.0)
MCHC: 32.6 g/dL (ref 30.0–36.0)
MCV: 102.8 fL — ABNORMAL HIGH (ref 80.0–100.0)
Metamyelocytes Relative: 0 %
Monocytes Absolute: 1 10*3/uL (ref 0.1–1.0)
Monocytes Relative: 6 %
Myelocytes: 0 %
NEUTROS PCT: 88 %
NRBC: 0 /100{WBCs}
Neutro Abs: 14.5 10*3/uL — ABNORMAL HIGH (ref 1.7–7.7)
Other: 0 %
PROMYELOCYTES RELATIVE: 0 %
Platelets: 212 10*3/uL (ref 150–400)
RBC: 3.61 MIL/uL — ABNORMAL LOW (ref 3.87–5.11)
RDW: 13 % (ref 11.5–15.5)
WBC MORPHOLOGY: INCREASED
WBC: 16.6 10*3/uL — ABNORMAL HIGH (ref 4.0–10.5)
nRBC: 0 % (ref 0.0–0.2)

## 2018-09-21 LAB — I-STAT CG4 LACTIC ACID, ED: Lactic Acid, Venous: 1.79 mmol/L (ref 0.5–1.9)

## 2018-09-21 LAB — COMPREHENSIVE METABOLIC PANEL
ALT: 59 U/L — ABNORMAL HIGH (ref 0–44)
AST: 41 U/L (ref 15–41)
Albumin: 3 g/dL — ABNORMAL LOW (ref 3.5–5.0)
Alkaline Phosphatase: 127 U/L — ABNORMAL HIGH (ref 38–126)
Anion gap: 15 (ref 5–15)
BUN: 10 mg/dL (ref 8–23)
CO2: 25 mmol/L (ref 22–32)
Calcium: 8.8 mg/dL — ABNORMAL LOW (ref 8.9–10.3)
Chloride: 98 mmol/L (ref 98–111)
Creatinine, Ser: 0.87 mg/dL (ref 0.44–1.00)
GFR calc Af Amer: >60 mL/min (ref >60)
Glucose, Bld: 128 mg/dL — ABNORMAL HIGH (ref 70–99)
Potassium: 2.8 mmol/L — ABNORMAL LOW (ref 3.5–5.1)
Sodium: 138 mmol/L (ref 135–145)
Total Bilirubin: 1.1 mg/dL (ref 0.3–1.2)
Total Protein: 7.8 g/dL (ref 6.5–8.1)

## 2018-09-21 MED ORDER — TRAZODONE HCL 50 MG PO TABS
50.0000 mg | ORAL_TABLET | Freq: Every day | ORAL | Status: DC
  Administered 2018-09-21 – 2018-09-26 (×6): 50 mg via ORAL
  Filled 2018-09-21 (×7): qty 1

## 2018-09-21 MED ORDER — SODIUM CHLORIDE 0.9 % IV SOLN
1.0000 g | INTRAVENOUS | Status: DC
  Administered 2018-09-22 – 2018-09-23 (×2): 1 g via INTRAVENOUS
  Filled 2018-09-21 (×2): qty 10

## 2018-09-21 MED ORDER — HYDROXYCHLOROQUINE SULFATE 200 MG PO TABS
200.0000 mg | ORAL_TABLET | Freq: Every day | ORAL | Status: DC
  Administered 2018-09-22 – 2018-09-24 (×3): 200 mg via ORAL
  Filled 2018-09-21 (×3): qty 1

## 2018-09-21 MED ORDER — ADULT MULTIVITAMIN W/MINERALS CH
1.0000 | ORAL_TABLET | Freq: Every day | ORAL | Status: DC
  Administered 2018-09-22 – 2018-09-25 (×4): 1 via ORAL
  Filled 2018-09-21 (×6): qty 1

## 2018-09-21 MED ORDER — SODIUM CHLORIDE 0.9 % IV SOLN
500.0000 mg | INTRAVENOUS | Status: DC
  Administered 2018-09-22: 500 mg via INTRAVENOUS
  Filled 2018-09-21: qty 500

## 2018-09-21 MED ORDER — SODIUM CHLORIDE 0.9 % IV SOLN
500.0000 mg | INTRAVENOUS | Status: DC
  Administered 2018-09-21: 500 mg via INTRAVENOUS
  Filled 2018-09-21: qty 500

## 2018-09-21 MED ORDER — VITAMIN D 25 MCG (1000 UNIT) PO TABS
1000.0000 [IU] | ORAL_TABLET | Freq: Every day | ORAL | Status: DC
  Administered 2018-09-22 – 2018-09-27 (×5): 1000 [IU] via ORAL
  Filled 2018-09-21 (×6): qty 1

## 2018-09-21 MED ORDER — RALOXIFENE HCL 60 MG PO TABS
60.0000 mg | ORAL_TABLET | Freq: Every day | ORAL | Status: DC
  Administered 2018-09-22 – 2018-09-27 (×6): 60 mg via ORAL
  Filled 2018-09-21 (×6): qty 1

## 2018-09-21 MED ORDER — ENOXAPARIN SODIUM 40 MG/0.4ML ~~LOC~~ SOLN
40.0000 mg | SUBCUTANEOUS | Status: DC
  Administered 2018-09-22 – 2018-09-27 (×6): 40 mg via SUBCUTANEOUS
  Filled 2018-09-21 (×6): qty 0.4

## 2018-09-21 MED ORDER — SODIUM CHLORIDE 0.9 % IV SOLN
2.0000 g | INTRAVENOUS | Status: DC
  Administered 2018-09-21: 2 g via INTRAVENOUS
  Filled 2018-09-21: qty 20

## 2018-09-21 MED ORDER — SODIUM CHLORIDE 0.9 % IV SOLN
INTRAVENOUS | Status: DC
  Administered 2018-09-22 (×2): via INTRAVENOUS

## 2018-09-21 MED ORDER — CALCIUM CITRATE 950 (200 CA) MG PO TABS
200.0000 mg | ORAL_TABLET | Freq: Every day | ORAL | Status: DC
  Administered 2018-09-22 – 2018-09-25 (×4): 200 mg via ORAL
  Filled 2018-09-21 (×6): qty 1

## 2018-09-21 MED ORDER — CITALOPRAM HYDROBROMIDE 20 MG PO TABS
40.0000 mg | ORAL_TABLET | Freq: Every day | ORAL | Status: DC
  Administered 2018-09-22 – 2018-09-27 (×6): 40 mg via ORAL
  Filled 2018-09-21 (×6): qty 2

## 2018-09-21 MED ORDER — LEVOTHYROXINE SODIUM 50 MCG PO TABS
50.0000 ug | ORAL_TABLET | Freq: Every day | ORAL | Status: DC
  Administered 2018-09-22 – 2018-09-27 (×6): 50 ug via ORAL
  Filled 2018-09-21 (×6): qty 1

## 2018-09-21 MED ORDER — FOLIC ACID 1 MG PO TABS
1.0000 mg | ORAL_TABLET | Freq: Every day | ORAL | Status: DC
  Administered 2018-09-22 – 2018-09-27 (×6): 1 mg via ORAL
  Filled 2018-09-21 (×6): qty 1

## 2018-09-21 MED ORDER — POTASSIUM CHLORIDE CRYS ER 20 MEQ PO TBCR
40.0000 meq | EXTENDED_RELEASE_TABLET | Freq: Once | ORAL | Status: AC
  Administered 2018-09-21: 40 meq via ORAL

## 2018-09-21 MED ORDER — METHYLPREDNISOLONE SODIUM SUCC 40 MG IJ SOLR
40.0000 mg | Freq: Two times a day (BID) | INTRAMUSCULAR | Status: DC
  Administered 2018-09-21 – 2018-09-23 (×4): 40 mg via INTRAVENOUS
  Filled 2018-09-21 (×4): qty 1

## 2018-09-21 MED ORDER — SODIUM CHLORIDE 0.9 % IV SOLN
1000.0000 mL | INTRAVENOUS | Status: DC
  Administered 2018-09-21: 1000 mL via INTRAVENOUS

## 2018-09-21 MED ORDER — ACETAMINOPHEN 500 MG PO TABS
1000.0000 mg | ORAL_TABLET | Freq: Once | ORAL | Status: AC
  Administered 2018-09-21: 1000 mg via ORAL
  Filled 2018-09-21: qty 2

## 2018-09-21 MED ORDER — ALBUTEROL SULFATE (2.5 MG/3ML) 0.083% IN NEBU
5.0000 mg | INHALATION_SOLUTION | Freq: Once | RESPIRATORY_TRACT | Status: AC
  Administered 2018-09-21: 5 mg via RESPIRATORY_TRACT
  Filled 2018-09-21: qty 6

## 2018-09-21 MED ORDER — IPRATROPIUM BROMIDE 0.02 % IN SOLN
0.5000 mg | Freq: Once | RESPIRATORY_TRACT | Status: AC
  Administered 2018-09-21: 0.5 mg via RESPIRATORY_TRACT
  Filled 2018-09-21: qty 2.5

## 2018-09-21 NOTE — ED Provider Notes (Signed)
[facility] EMERGENCY DEPARTMENT Provider Note   CSN: [id number] Arrival date & time: 09/21/18  2014     History   Chief Complaint Chief Complaint  Patient presents with  . Shortness of Breath  . Cough    HPI Megan Porter is a 78 y.o. female.  78 year old female presents with fever, cough, shortness of breath x2 days.  Cough is been productive and not associated with blood.  She does note dyspnea on exertion.  Denies any headache, photophobia, neck pain.  No abdominal discomfort.  Denies any vomiting or diarrhea.  No urinary symptoms.  Has been using over-the-counter medication without relief.  Called her doctor and told to come here.     Past Medical History:  Diagnosis Date  . Arthritis   . Cancer ([place])   . Osteoarthritis     Patient Active Problem List   Diagnosis Date Noted  . Bronchiectasis without complication ([place]) 06/09/3006  . Primary osteoarthritis of both knees 04/06/2017  . DDD (degenerative disc disease), lumbar history of lumbar surgery 1970 04/06/2017  . Age-related osteoporosis without current pathological fracture 04/06/2017  . History of anxiety 04/06/2017  . History of breast cancer 04/04/2017  . Primary osteoarthritis of both feet 10/02/2016  . Primary osteoarthritis of both hands 10/02/2016  . High risk medications (not anticoagulants) long-term use 10/02/2016  . Rheumatoid arthritis, seronegative, multiple sites ([place]) 10/02/2016    Past Surgical History:  Procedure Laterality Date  . BACK SURGERY    . BREAST RECONSTRUCTION       OB History   No obstetric history on file.      Home Medications    Prior to Admission medications   Medication Sig Start Date End Date Taking? Authorizing Provider  calcium citrate (CALCITRATE - DOSED IN MG ELEMENTAL CALCIUM) 950 MG tablet Take 200 mg of elemental calcium by mouth daily.    [provider]  cholecalciferol (VITAMIN D) 1000 units tablet Take 1,000 Units by mouth  daily.    [provider]  citalopram (CELEXA) 40 MG tablet Take 40 mg by mouth daily.  08/07/16   [provider]  folic acid (FOLVITE) 1 MG tablet TAKE 2 TABLETS EVERY DAY Patient taking differently: TAKE 1 TABLET EVERY DAY 08/12/17   Bo Merino, MD  hydroxychloroquine (PLAQUENIL) 200 MG tablet TAKE 1 TABLET (200 MG TOTAL) BY MOUTH DAILY. 06/25/18   Bo Merino, MD  levothyroxine (SYNTHROID, LEVOTHROID) 50 MCG tablet Take by mouth daily before breakfast.  07/02/16   [provider]  methotrexate (RHEUMATREX) 2.5 MG tablet TAKE 4 TABLETS BY MOUTH ONCE WEEKLY. 08/01/18   Ofilia Neas, PA-C  Multiple Vitamin (MULTIVITAMIN) capsule Take 1 capsule by mouth daily.    [provider]  raloxifene (EVISTA) 60 MG tablet TK 1 T PO D 03/07/16   [provider]  traZODone (DESYREL) 50 MG tablet Take by mouth at bedtime.  04/04/17   [provider]    Family History Family History  Family history unknown: Yes    Social History Social History   Tobacco Use  . Smoking status: Never Smoker  . Smokeless tobacco: Never Used  Substance Use Topics  . Alcohol use: Yes    Alcohol/week: 1.0 standard drinks    Types: 1 Glasses of wine per week  . Drug use: No     Allergies   Patient has no known allergies.   Review of Systems Review of Systems  All other systems reviewed and are negative.  Physical Exam Updated Vital Signs BP (!) 172/78   Pulse (!) 114   Temp (!) 103 F (39.4 C) (Rectal)   Resp (!) 44   SpO2 95%   Physical Exam Vitals signs and nursing note reviewed.  Constitutional:      General: She is not in acute distress.    Appearance: Normal appearance. She is well-developed. She is not toxic-appearing.  HENT:     Head: Normocephalic and atraumatic.  Eyes:     General: Lids are normal.     Conjunctiva/sclera: Conjunctivae normal.     Pupils: Pupils are equal, round, and reactive to light.  Neck:      Musculoskeletal: Normal range of motion and neck supple.     Thyroid: No thyroid mass.     Trachea: No tracheal deviation.  Cardiovascular:     Rate and Rhythm: Regular rhythm. Tachycardia present.     Heart sounds: Normal heart sounds. No murmur. No gallop.   Pulmonary:     Effort: Tachypnea and respiratory distress present.     Breath sounds: No stridor. Examination of the right-upper field reveals decreased breath sounds. Examination of the left-upper field reveals decreased breath sounds. Decreased breath sounds present. No wheezing, rhonchi or rales.  Abdominal:     General: Bowel sounds are normal. There is no distension.     Palpations: Abdomen is soft.     Tenderness: There is no abdominal tenderness. There is no rebound.  Musculoskeletal: Normal range of motion.        General: No tenderness.  Skin:    General: Skin is warm and dry.     Findings: No abrasion or rash.  Neurological:     Mental Status: She is alert and oriented to person, place, and time.     GCS: GCS eye subscore is 4. GCS verbal subscore is 5. GCS motor subscore is 6.     Cranial Nerves: No cranial nerve deficit.     Sensory: No sensory deficit.  Psychiatric:        Speech: Speech normal.        Behavior: Behavior normal.      ED Treatments / Results  Labs (all labs ordered are listed, but only abnormal results are displayed) Labs Reviewed  CULTURE, BLOOD (ROUTINE X 2)  CULTURE, BLOOD (ROUTINE X 2)  COMPREHENSIVE METABOLIC PANEL  CBC WITH DIFFERENTIAL/PLATELET  URINALYSIS, ROUTINE W REFLEX MICROSCOPIC  I-STAT CG4 LACTIC ACID, ED    EKG EKG Interpretation  Date/Time:  Sunday September 21 2018 20:30:38 EST Ventricular Rate:  121 PR Interval:    QRS Duration: 95 QT Interval:  348 QTC Calculation: 494 R Axis:   97 Text Interpretation:  Sinus tachycardia Right axis deviation Repol abnrm suggests ischemia, inferior leads Baseline wander in lead(s) V1 Confirmed by Lacretia Leigh ([id number]) on 09/21/2018  8:55:19 PM   Radiology No results found.  Procedures Procedures (including critical care time)  Medications Ordered in ED Medications  0.9 %  sodium chloride infusion (has no administration in time range)  acetaminophen (TYLENOL) tablet 1,000 mg (has no administration in time range)     Initial Impression / Assessment and Plan / ED Course  I have reviewed the triage vital signs and the nursing notes.  Pertinent labs & imaging results that were available during my care of the patient were reviewed by me and considered in my medical decision making (see chart for details).  Patient is chest x-ray consistent with pneumonia.  Lactate normal.  Mild  hypokalemia appreciated with potassium of 2.8.  Leukocytosis noted on her CBC.  Blood cultures obtained.  Patient started on antibiotics for community-acquired pneumonia and will be admitted to the hospitalist service   Final Clinical Impressions(s) / ED Diagnoses   Final diagnoses:  None    ED Discharge Orders    None       Lacretia Leigh, MD 09/21/18 [id number]

## 2018-09-21 NOTE — ED Triage Notes (Signed)
Pt here for a cough, shortness of breath, and malaise for the last day.  Pt states she called on call PCP and told to take some OTC stuff.  Non productive cough.

## 2018-09-21 NOTE — H&P (Signed)
History and Physical   Megan Porter BHA:[id number] DOB: 1941/05/23 DOA: 09/21/2018  Referring MD/NP/PA: Dr. Zenia Resides  PCP: Tisovec, Fransico Him, MD   Outpatient Specialists: None  Patient coming from: Home  Chief Complaint: Shortness of breath  HPI: Megan Porter is a 78 y.o. female with medical history significant of rheumatoid arthritis, osteoporosis, history of breast cancer, hypertension who presents to the ER with 2 days of progressive cough shortness of breath as well as weakness.  She has exertional dyspnea associated with this.  Mild production of sputum.  No hematemesis.  No hemoptysis.  No nausea vomiting or diarrhea.  Patient is relatively immunocompromised on methotrexate as well as treatment for rheumatoid arthritis.  Denied any sick contacts.  No recent upper respite tract infection.  Patient denied any recent travels.  She called her physician who asked her to come to the ER for evaluation..  ED Course: Temperature is 103 with blood pressure 172/78 pulse 122 respiratory rate rate of 44 oxygen sats 92% on room air she has a white count of 16.6 hemoglobin 12.1 and platelets 212.  Sodium 138 potassium 2.8 chloride 98 CO2 is 25 with BUN 10 creatinine 0.87 calcium 8.8.  Lactic acid is 1.79.  Chest x-ray showed right upper lobe airspace disease consistent with right upper lobe pneumonia.  Patient initiated on IV Rocephin and Zithromax and she has been admitted to the hospital for treatment.  Review of Systems: As per HPI otherwise 10 point review of systems negative.    Past Medical History:  Diagnosis Date  . Arthritis   . Cancer ([place])   . Osteoarthritis     Past Surgical History:  Procedure Laterality Date  . BACK SURGERY    . BREAST RECONSTRUCTION       reports that she has never smoked. She has never used smokeless tobacco. She reports current alcohol use of about 1.0 standard drinks of alcohol per week. She reports that she does not use drugs.  No Known Allergies  Family  History  Family history unknown: Yes     Prior to Admission medications   Medication Sig Start Date End Date Taking? Authorizing Provider  calcium citrate (CALCITRATE - DOSED IN MG ELEMENTAL CALCIUM) 950 MG tablet Take 200 mg of elemental calcium by mouth daily.    [provider]  cholecalciferol (VITAMIN D) 1000 units tablet Take 1,000 Units by mouth daily.    [provider]  citalopram (CELEXA) 40 MG tablet Take 40 mg by mouth daily.  08/07/16   [provider]  folic acid (FOLVITE) 1 MG tablet TAKE 2 TABLETS EVERY DAY Patient taking differently: TAKE 1 TABLET EVERY DAY 08/12/17   Bo Merino, MD  hydroxychloroquine (PLAQUENIL) 200 MG tablet TAKE 1 TABLET (200 MG TOTAL) BY MOUTH DAILY. 06/25/18   Bo Merino, MD  levothyroxine (SYNTHROID, LEVOTHROID) 50 MCG tablet Take by mouth daily before breakfast.  07/02/16   [provider]  methotrexate (RHEUMATREX) 2.5 MG tablet TAKE 4 TABLETS BY MOUTH ONCE WEEKLY. 08/01/18   Ofilia Neas, PA-C  Multiple Vitamin (MULTIVITAMIN) capsule Take 1 capsule by mouth daily.    [provider]  raloxifene (EVISTA) 60 MG tablet TK 1 T PO D 03/07/16   [provider]  traZODone (DESYREL) 50 MG tablet Take by mouth at bedtime.  04/04/17   [provider]    Physical Exam: Vitals:   09/21/18 2100 09/21/18 2115 09/21/18 2130 09/21/18 2145  BP: (!) 161/74 (!) 156/71 (!) 152/60 (!) 155/70  Pulse: (!) 122 (!) 112 (!) 108 (!) 110  Resp: (!) 41 (!) 40 (!) 37 (!) 31  Temp:      TempSrc:      SpO2: 95% 93% 92% 92%      Constitutional: NAD, calm, comfortable Vitals:   09/21/18 2100 09/21/18 2115 09/21/18 2130 09/21/18 2145  BP: (!) 161/74 (!) 156/71 (!) 152/60 (!) 155/70  Pulse: (!) 122 (!) 112 (!) 108 (!) 110  Resp: (!) 41 (!) 40 (!) 37 (!) 31  Temp:      TempSrc:      SpO2: 95% 93% 92% 92%   Eyes: PERRL, lids and conjunctivae normal ENMT: Mucous membranes are moist. Posterior  pharynx clear of any exudate or lesions.Normal dentition.  Neck: normal, supple, no masses, no thyromegaly Respiratory:  decreased air entery bilaterally with crackles and mild wheezing, no crackles. Normal respiratory effort. No accessory muscle use.  Cardiovascular: Regular rate and rhythm, no murmurs / rubs / gallops. No extremity edema. 2+ pedal pulses. No carotid bruits.  Abdomen: no tenderness, no masses palpated. No hepatosplenomegaly. Bowel sounds positive.  Musculoskeletal: no clubbing / cyanosis. No joint deformity upper and lower extremities. Good ROM, no contractures. Normal muscle tone.  Skin: no rashes, lesions, ulcers. No induration Neurologic: CN 2-12 grossly intact. Sensation intact, DTR normal. Strength 5/5 in all 4.  Psychiatric: Normal judgment and insight. Alert and oriented x 3. Normal mood.     Labs on Admission: I have personally reviewed following labs and imaging studies  CBC: Recent Labs  Lab 09/21/18 2031  WBC 16.6*  NEUTROABS 14.5*  HGB 12.1  HCT 37.1  MCV 102.8*  PLT 902   Basic Metabolic Panel: Recent Labs  Lab 09/21/18 2031  NA 138  K 2.8*  CL 98  CO2 25  GLUCOSE 128*  BUN 10  CREATININE 0.87  CALCIUM 8.8*   GFR: CrCl cannot be calculated (Unknown ideal weight.). Liver Function Tests: Recent Labs  Lab 09/21/18 2031  AST 41  ALT 59*  ALKPHOS 127*  BILITOT 1.1  PROT 7.8  ALBUMIN 3.0*   No results for input(s): LIPASE, AMYLASE in the last 168 hours. No results for input(s): AMMONIA in the last 168 hours. Coagulation Profile: No results for input(s): INR, PROTIME in the last 168 hours. Cardiac Enzymes: No results for input(s): CKTOTAL, CKMB, CKMBINDEX, TROPONINI in the last 168 hours. BNP (last 3 results) No results for input(s): PROBNP in the last 8760 hours. HbA1C: No results for input(s): HGBA1C in the last 72 hours. CBG: No results for input(s): GLUCAP in the last 168 hours. Lipid Profile: No results for input(s): CHOL,  HDL, LDLCALC, TRIG, CHOLHDL, LDLDIRECT in the last 72 hours. Thyroid Function Tests: No results for input(s): TSH, T4TOTAL, FREET4, T3FREE, THYROIDAB in the last 72 hours. Anemia Panel: No results for input(s): VITAMINB12, FOLATE, FERRITIN, TIBC, IRON, RETICCTPCT in the last 72 hours. Urine analysis: No results found for: COLORURINE, APPEARANCEUR, LABSPEC, PHURINE, GLUCOSEU, HGBUR, BILIRUBINUR, KETONESUR, PROTEINUR, UROBILINOGEN, NITRITE, LEUKOCYTESUR Sepsis Labs: @LABRCNTIP (procalcitonin:4,lacticidven:4) )No results found for this or any previous visit (from the past 240 hour(s)).   Radiological Exams on Admission: Dg Chest 2 View  Result Date: 09/21/2018 CLINICAL DATA:  Dyspnea, cough and fever EXAM: CHEST - 2 VIEW COMPARISON:  03/04/2011 CXR and chest CT 04/23/2017 FINDINGS: Normal heart size and mediastinal contours. Confluent opacity in the right upper lobe posterior segment suspicious for pneumonia with more streaky parenchymal opacities likely representing atelectasis in the upper lobes, lingula and left  base. No effusion or pneumothorax. Osteopenia without acute osseous abnormality. IMPRESSION: Dominant finding is that of right upper lobe airspace disease consistent with right upper lobe pneumonia. Followup PA and lateral chest X-ray is recommended in 3-4 weeks following trial of antibiotic therapy to ensure resolution and exclude underlying malignancy. Electronically Signed   By: Ashley Royalty M.D.   On: 09/21/2018 21:02    EKG: Independently reviewed.  It shows sinus tachycardia with a rate of 121 right axis deviation with repolarization activities.  Appears unchanged from previous  Assessment/Plan Principal Problem:   Pneumonia Active Problems:   Rheumatoid arthritis, seronegative, multiple sites ([place])   History of breast cancer   Hypokalemia   Benign essential HTN   Leucocytosis     #1 pneumonia: Most likely community-acquired pneumonia.  Due to the location of the pneumonia  in the right upper lung however possibility of mild aspiration could be considered.  Continue Rocephin and Zithromax for now follow blood cultures and sputum cultures.  Patient is relatively immunocompromised so if no response within the first 24 hours we may have to broaden coverage.  #2 history of rheumatoid arthritis: Continue home regimen.  She gets methotrexate weekly and should resume on her due date.  #3 history of breast cancer:  patient is currently stable.  Defer treatment to her oncologist.  #4 hypokalemia: Replete potassium and monitor closely.  #5 hypertension: Blood pressure is currently elevated.  Restart home regimen and adjust as needed.   DVT prophylaxis: Lovenox  Code Status: Full  Family Communication: No family available  Disposition Plan: Home  Consults called: None  Admission status: inpatient   Severity of Illness: The appropriate patient status for this patient is INPATIENT. Inpatient status is judged to be reasonable and necessary in order to provide the required intensity of service to ensure the patient's safety. The patient's presenting symptoms, physical exam findings, and initial radiographic and laboratory data in the context of their chronic comorbidities is felt to place them at high risk for further clinical deterioration. Furthermore, it is not anticipated that the patient will be medically stable for discharge from the hospital within 2 midnights of admission. The following factors support the patient status of inpatient.   " The patient's presenting symptoms include SOB. " The worrisome physical exam findings include Rhonchi. " The initial radiographic and laboratory data are worrisome because of CXR showing infiltrates. " The chronic co-morbidities include Rheumatoid arthritis.   * I certify that at the point of admission it is my clinical judgment that the patient will require inpatient hospital care spanning beyond 2 midnights from the point of  admission due to high intensity of service, high risk for further deterioration and high frequency of surveillance required.Barbette Merino MD [facility] Pager [phone]  If 7PM-7AM, please contact night-coverage [email or web address] Password [email or web address]  09/21/2018, 10:12 PM

## 2018-09-22 LAB — COMPREHENSIVE METABOLIC PANEL
ALT: 48 U/L — ABNORMAL HIGH (ref 0–44)
AST: 33 U/L (ref 15–41)
Albumin: 2.1 g/dL — ABNORMAL LOW (ref 3.5–5.0)
Alkaline Phosphatase: 106 U/L (ref 38–126)
Anion gap: 11 (ref 5–15)
BUN: 8 mg/dL (ref 8–23)
CO2: 21 mmol/L — AB (ref 22–32)
Calcium: 8 mg/dL — ABNORMAL LOW (ref 8.9–10.3)
Chloride: 112 mmol/L — ABNORMAL HIGH (ref 98–111)
Creatinine, Ser: 0.71 mg/dL (ref 0.44–1.00)
GFR calc non Af Amer: >60 mL/min (ref >60)
Glucose, Bld: 158 mg/dL — ABNORMAL HIGH (ref 70–99)
Potassium: 3.3 mmol/L — ABNORMAL LOW (ref 3.5–5.1)
Sodium: 144 mmol/L (ref 135–145)
Total Bilirubin: 0.5 mg/dL (ref 0.3–1.2)
Total Protein: 5.7 g/dL — ABNORMAL LOW (ref 6.5–8.1)

## 2018-09-22 LAB — URINALYSIS, ROUTINE W REFLEX MICROSCOPIC
Bilirubin Urine: NEGATIVE
Glucose, UA: NEGATIVE mg/dL
Ketones, ur: 5 mg/dL — AB
Leukocytes, UA: NEGATIVE
Nitrite: NEGATIVE
Protein, ur: 30 mg/dL — AB
Specific Gravity, Urine: 1.008 (ref 1.005–1.030)
pH: 6 (ref 5.0–8.0)

## 2018-09-22 LAB — HIV ANTIBODY (ROUTINE TESTING W REFLEX): HIV Screen 4th Generation wRfx: NONREACTIVE

## 2018-09-22 LAB — STREP PNEUMONIAE URINARY ANTIGEN: Strep Pneumo Urinary Antigen: NEGATIVE

## 2018-09-22 MED ORDER — ACETAMINOPHEN 325 MG PO TABS
650.0000 mg | ORAL_TABLET | Freq: Four times a day (QID) | ORAL | Status: DC | PRN
  Administered 2018-09-24 – 2018-09-27 (×2): 650 mg via ORAL
  Filled 2018-09-22 (×2): qty 2

## 2018-09-22 MED ORDER — BENZONATATE 100 MG PO CAPS
100.0000 mg | ORAL_CAPSULE | Freq: Three times a day (TID) | ORAL | Status: DC | PRN
  Administered 2018-09-22: 100 mg via ORAL
  Filled 2018-09-22: qty 1

## 2018-09-22 MED ORDER — GUAIFENESIN-DM 100-10 MG/5ML PO SYRP
5.0000 mL | ORAL_SOLUTION | ORAL | Status: DC | PRN
  Administered 2018-09-23 – 2018-09-24 (×7): 5 mL via ORAL
  Filled 2018-09-22 (×9): qty 5

## 2018-09-22 MED ORDER — ALBUTEROL SULFATE (2.5 MG/3ML) 0.083% IN NEBU
2.5000 mg | INHALATION_SOLUTION | RESPIRATORY_TRACT | Status: DC | PRN
  Administered 2018-09-22 – 2018-09-24 (×3): 2.5 mg via RESPIRATORY_TRACT
  Filled 2018-09-22 (×3): qty 3

## 2018-09-22 MED ORDER — GUAIFENESIN ER 600 MG PO TB12
1200.0000 mg | ORAL_TABLET | Freq: Two times a day (BID) | ORAL | Status: DC
  Administered 2018-09-22 – 2018-09-27 (×11): 1200 mg via ORAL
  Filled 2018-09-22 (×11): qty 2

## 2018-09-22 MED ORDER — SODIUM CHLORIDE 0.9 % IV SOLN
1000.0000 mL | INTRAVENOUS | Status: DC
  Administered 2018-09-22 – 2018-09-23 (×3): 1000 mL via INTRAVENOUS

## 2018-09-22 MED ORDER — POTASSIUM CHLORIDE CRYS ER 20 MEQ PO TBCR
40.0000 meq | EXTENDED_RELEASE_TABLET | Freq: Once | ORAL | Status: AC
  Administered 2018-09-22: 40 meq via ORAL
  Filled 2018-09-22: qty 2

## 2018-09-22 NOTE — Progress Notes (Signed)
Patient is admitted to [address] with the diagnosis of Pneumonia. Alert and oriented x 4. Denied any acute pain at this moment. Full assessment to epic completed. Will continue to monitor.

## 2018-09-22 NOTE — Progress Notes (Signed)
PROGRESS NOTE                                                                                                                                                                                                             Patient Demographics:    Megan Porter, is a 78 y.o. female, DOB - 12-21-1940, RSW:[id number]  Admit date - 09/21/2018   Admitting Physician Elwyn Reach, MD  Outpatient Primary MD for the patient is Tisovec, Fransico Him, MD  LOS - 1   Chief Complaint  Patient presents with  . Shortness of Breath  . Cough       Brief Narrative    78 y.o. female with medical history significant of rheumatoid arthritis, osteoporosis, history of breast cancer, hypertension comes with [place] of breath, fever cough, chest x-ray significant for pneumonia .   Subjective:    [name] today for some mild dyspnea, cough, nonproductive, generalized weakness     Assessment  & Plan :    Principal Problem:   Pneumonia Active Problems:   Rheumatoid arthritis, seronegative, multiple sites ([place])   History of breast cancer   Hypokalemia   Benign essential HTN   Leucocytosis   Sepsis to community-acquired pneumonia -Sepsis present on admission, febrile, tachycardic, tachypneic, with elevated lactic acid, with acute respiratory failure -Due to community-acquired pneumonia, follow on blood cultures, sputum cultures, Legionella antigen, continue with CAP coverage Rocephin and azithromycin.  Acute hypoxic respiratory failure -Desaturating on room air, remains on 2 L nasal cannula, encouraged use incentive spirometry, flutter valve, started on Mucinex  history of rheumatoid arthritis: -Hold meds during hospital stay  history of breast cancer:  - patient is currently stable.  Defer treatment to her oncologist.  hypokalemia:  - Replete potassium and monitor closely.  hypertension:  - Blood pressure is currently elevated.  Restart home regimen and  adjust as needed.  Hypokalemia - repleted ,recheck in am.   Code Status : Full  Family Communication  : None at bedside  Disposition Plan  : Home when stable  Consults  :  None  Procedures  : None  DVT Prophylaxis  :  [place] lovenox  Lab Results  Component Value Date   PLT 212 09/21/2018    Antibiotics  :    Anti-infectives (From admission, onward)   Start  Dose/Rate Route Frequency Ordered Stop   09/22/18 2030  azithromycin (ZITHROMAX) 500 mg in sodium chloride 0.9 % 250 mL IVPB     500 mg 250 mL/hr over 60 Minutes Intravenous Every 24 hours 09/21/18 2309 09/29/18 2029   09/22/18 2000  cefTRIAXone (ROCEPHIN) 1 g in sodium chloride 0.9 % 100 mL IVPB     1 g 200 mL/hr over 30 Minutes Intravenous Every 24 hours 09/21/18 2309 09/29/18 1959   09/22/18 1000  hydroxychloroquine (PLAQUENIL) tablet 200 mg     200 mg Oral Daily 09/21/18 2308     09/21/18 2115  cefTRIAXone (ROCEPHIN) 2 g in sodium chloride 0.9 % 100 mL IVPB  Status:  Discontinued     2 g 200 mL/hr over 30 Minutes Intravenous Every 24 hours 09/21/18 2107 09/21/18 2330   09/21/18 2115  azithromycin (ZITHROMAX) 500 mg in sodium chloride 0.9 % 250 mL IVPB  Status:  Discontinued     500 mg 250 mL/hr over 60 Minutes Intravenous Every 24 hours 09/21/18 2107 09/21/18 2330        Objective:   Vitals:   09/21/18 2230 09/21/18 2244 09/21/18 2316 09/22/18 0534  BP: (!) 141/50  (!) 118/51 117/67  Pulse: (!) 120  (!) 110 86  Resp: (!) 26  16 20   Temp:  100.3 F (37.9 C) 99.6 F (37.6 C) 97.8 F (36.6 C)  TempSrc:  Oral Oral Oral  SpO2: 90%  (!) 89% 98%  Weight:   58.1 kg   Height:   5\' 3"  (1.6 m)     Wt Readings from Last 3 Encounters:  09/21/18 58.1 kg  08/01/18 58.4 kg  03/28/18 59.4 kg     Intake/Output Summary (Last 24 hours) at 09/22/2018 1048 Last data filed at 09/22/2018 0714 Gross per 24 hour  Intake 100 ml  Output 900 ml  Net -800 ml     Physical Exam  Awake Alert, Oriented X 3, No new F.N  deficits, Normal affect Symmetrical Chest wall movement, Good air movement bilaterally, CTAB RRR,No Gallops,Rubs or new Murmurs, No Parasternal Heave +ve B.Sounds, Abd Soft, No tenderness,  No rebound - guarding or rigidity. No Cyanosis, Clubbing or edema, No new Rash or bruise      Data Review:    CBC Recent Labs  Lab 09/21/18 2031  WBC 16.6*  HGB 12.1  HCT 37.1  PLT 212  MCV 102.8*  MCH 33.5  MCHC 32.6  RDW 13.0  LYMPHSABS 0.7  MONOABS 1.0  EOSABS 0.2  BASOSABS 0.2*    Chemistries  Recent Labs  Lab 09/21/18 2031 09/22/18 0440  NA 138 144  K 2.8* 3.3*  CL 98 112*  CO2 25 21*  GLUCOSE 128* 158*  BUN 10 8  CREATININE 0.87 0.71  CALCIUM 8.8* 8.0*  AST 41 33  ALT 59* 48*  ALKPHOS 127* 106  BILITOT 1.1 0.5   ------------------------------------------------------------------------------------------------------------------ No results for input(s): CHOL, HDL, LDLCALC, TRIG, CHOLHDL, LDLDIRECT in the last 72 hours.  No results found for: HGBA1C ------------------------------------------------------------------------------------------------------------------ No results for input(s): TSH, T4TOTAL, T3FREE, THYROIDAB in the last 72 hours.  Invalid input(s): FREET3 ------------------------------------------------------------------------------------------------------------------ No results for input(s): VITAMINB12, FOLATE, FERRITIN, TIBC, IRON, RETICCTPCT in the last 72 hours.  Coagulation profile No results for input(s): INR, PROTIME in the last 168 hours.  No results for input(s): DDIMER in the last 72 hours.  Cardiac Enzymes No results for input(s): CKMB, TROPONINI, MYOGLOBIN in the last 168 hours.  Invalid input(s): CK ------------------------------------------------------------------------------------------------------------------ No results  found for: BNP  Inpatient Medications  Scheduled Meds: . calcium citrate  200 mg of elemental calcium Oral Daily    . cholecalciferol  1,000 Units Oral Daily  . citalopram  40 mg Oral Daily  . enoxaparin (LOVENOX) injection  40 mg Subcutaneous Q24H  . folic acid  1 mg Oral Daily  . hydroxychloroquine  200 mg Oral Daily  . levothyroxine  50 mcg Oral QAC breakfast  . methylPREDNISolone (SOLU-MEDROL) injection  40 mg Intravenous Q12H  . multivitamin with minerals  1 tablet Oral Daily  . raloxifene  60 mg Oral Daily  . traZODone  50 mg Oral QHS   Continuous Infusions: . sodium chloride 1,000 mL (09/21/18 2121)  . sodium chloride 100 mL/hr at 09/22/18 0557  . azithromycin    . cefTRIAXone (ROCEPHIN)  IV     PRN Meds:.  Micro Results No results found for this or any previous visit (from the past 240 hour(s)).  Radiology Reports Dg Chest 2 View  Result Date: 09/21/2018 CLINICAL DATA:  Dyspnea, cough and fever EXAM: CHEST - 2 VIEW COMPARISON:  03/04/2011 CXR and chest CT 04/23/2017 FINDINGS: Normal heart size and mediastinal contours. Confluent opacity in the right upper lobe posterior segment suspicious for pneumonia with more streaky parenchymal opacities likely representing atelectasis in the upper lobes, lingula and left base. No effusion or pneumothorax. Osteopenia without acute osseous abnormality. IMPRESSION: Dominant finding is that of right upper lobe airspace disease consistent with right upper lobe pneumonia. Followup PA and lateral chest X-ray is recommended in 3-4 weeks following trial of antibiotic therapy to ensure resolution and exclude underlying malignancy. Electronically Signed   By: Ashley Royalty M.D.   On: 09/21/2018 21:02     Phillips Climes M.D on 09/22/2018 at 10:48 AM  Between 7am to 7pm - Pager - [phone]  After 7pm go to [email or web address] - password [facility]  [facility] -  Office  [phone]

## 2018-09-22 NOTE — Evaluation (Signed)
Physical Therapy Evaluation Patient Details Name: Megan Porter MRN: [id number] DOB: August 14, 1941 Today's Date: 09/22/2018   History of Present Illness  Patient is a 78 y/o female presenting to the ED on 09/21/17 with primary complaints of shortness of breath. Chest x-ray showed right upper lobe airspace disease consistent with right upper lobe pneumonia. Past medical history significant of rheumatoid arthritis, osteoporosis, history of breast cancer, hypertension.     Clinical Impression  Megan Porter is a very pleasant 78 y/o female admitted with the above listed diagnosis. Patient reports IND with all mobility and ADLs prior to admission. Patient today requiring general min guard/supervision level assist for safety - no physical assist provided for mobility/stability. PT to follow acutely to maximize safe and independent functional mobility prior to d/c home. Do not anticipate need for PT once discharged.     Follow Up Recommendations No PT follow up;Supervision - Intermittent    Equipment Recommendations  None recommended by PT    Recommendations for Other Services       Precautions / Restrictions Precautions Precautions: Fall Restrictions Weight Bearing Restrictions: No      Mobility  Bed Mobility Overal bed mobility: Modified Independent                Transfers Overall transfer level: Needs assistance Equipment used: None Transfers: Sit to/from Stand Sit to Stand: Supervision         General transfer comment: for safety and immediate standing balance  Ambulation/Gait Ambulation/Gait assistance: Min guard;Supervision Gait Distance (Feet): 140 Feet Assistive device: IV Pole Gait Pattern/deviations: Step-through pattern;Decreased stride length;Narrow base of support Gait velocity: decreased   General Gait Details: slow steady pace of gait; no LOB or overt instability; Min guard/SUP only for safety  Stairs            Wheelchair Mobility    Modified  [place] (Stroke Patients Only)       Balance Overall balance assessment: Mild deficits observed, not formally tested                                           Pertinent Vitals/Pain Pain Assessment: No/denies pain    Home Living Family/patient expects to be discharged to:: Private residence Living Arrangements: Spouse/significant other Available Help at Discharge: Family;Available PRN/intermittently Type of Home: House Home Access: Stairs to enter Entrance Stairs-Rails: None Entrance Stairs-Number of Steps: 2 Home Layout: Two level;Able to live on main level with bedroom/bathroom Home Equipment: Grab bars - tub/shower      Prior Function Level of Independence: Independent               Hand Dominance   Dominant Hand: Right    Extremity/Trunk Assessment   Upper Extremity Assessment Upper Extremity Assessment: Defer to OT evaluation    Lower Extremity Assessment Lower Extremity Assessment: Generalized weakness    Cervical / Trunk Assessment Cervical / Trunk Assessment: Normal  Communication   Communication: No difficulties  Cognition Arousal/Alertness: Awake/alert Behavior During Therapy: WFL for tasks assessed/performed Overall Cognitive Status: Within Functional Limits for tasks assessed                                        General Comments General comments (skin integrity, edema, etc.): 3L supplemental O2 via [place] with mobility; SpO2 90-94% at rest;  desat to 89% on 3L with mobility    Exercises     Assessment/Plan    PT Assessment Patient needs continued PT services  PT Problem List Decreased strength;Decreased activity tolerance;Decreased balance;Decreased mobility;Decreased safety awareness       PT Treatment Interventions DME instruction;Gait training;Stair training;Functional mobility training;Therapeutic activities;Therapeutic exercise;Balance training;Patient/family education    PT Goals (Current goals can  be found in the Care Plan section)  Acute Rehab PT Goals Patient Stated Goal: return home soon PT Goal Formulation: With patient Time For Goal Achievement: 10/06/18 Potential to Achieve Goals: Good    Frequency Min 3X/week   Barriers to discharge        Co-evaluation               AM-PAC PT "6 Clicks" Mobility  Outcome Measure Help needed turning from your back to your side while in a flat bed without using bedrails?: None Help needed moving from lying on your back to sitting on the side of a flat bed without using bedrails?: None Help needed moving to and from a bed to a chair (including a wheelchair)?: A Little Help needed standing up from a chair using your arms (e.g., wheelchair or bedside chair)?: A Little Help needed to walk in hospital room?: A Little Help needed climbing 3-5 steps with a railing? : A Little 6 Click Score: 20    End of Session Equipment Utilized During Treatment: Gait belt;Oxygen Activity Tolerance: Patient tolerated treatment well Patient left: in bed;with call bell/phone within reach Nurse Communication: Mobility status PT Visit Diagnosis: Unsteadiness on feet (R26.81);Other abnormalities of gait and mobility (R26.89)    Time: 1450-1507 PT Time Calculation (min) (ACUTE ONLY): 17 min   Charges:   PT Evaluation $PT Eval Moderate Complexity: 1 Mod           Lanney Gins, PT, DPT Supplemental Physical Therapist 09/22/18 3:15 PM Pager: [phone] Office: [phone]

## 2018-09-23 LAB — BASIC METABOLIC PANEL
Anion gap: 9 (ref 5–15)
BUN: 11 mg/dL (ref 8–23)
CO2: 27 mmol/L (ref 22–32)
Calcium: 8.2 mg/dL — ABNORMAL LOW (ref 8.9–10.3)
Chloride: 108 mmol/L (ref 98–111)
Creatinine, Ser: 0.75 mg/dL (ref 0.44–1.00)
GFR calc Af Amer: >60 mL/min (ref >60)
GLUCOSE: 146 mg/dL — AB (ref 70–99)
POTASSIUM: 4 mmol/L (ref 3.5–5.1)
Sodium: 144 mmol/L (ref 135–145)

## 2018-09-23 LAB — CBC
HCT: 30.2 % — ABNORMAL LOW (ref 36.0–46.0)
Hemoglobin: 9.8 g/dL — ABNORMAL LOW (ref 12.0–15.0)
MCH: 33.8 pg (ref 26.0–34.0)
MCHC: 32.5 g/dL (ref 30.0–36.0)
MCV: 104.1 fL — ABNORMAL HIGH (ref 80.0–100.0)
Platelets: 207 10*3/uL (ref 150–400)
RBC: 2.9 MIL/uL — ABNORMAL LOW (ref 3.87–5.11)
RDW: 13.2 % (ref 11.5–15.5)
WBC: 17.2 10*3/uL — ABNORMAL HIGH (ref 4.0–10.5)
nRBC: 0 % (ref 0.0–0.2)

## 2018-09-23 MED ORDER — AZITHROMYCIN 500 MG PO TABS
500.0000 mg | ORAL_TABLET | Freq: Every day | ORAL | Status: DC
  Administered 2018-09-23 – 2018-09-25 (×3): 500 mg via ORAL
  Filled 2018-09-23 (×3): qty 1

## 2018-09-23 MED ORDER — PREDNISONE 20 MG PO TABS
20.0000 mg | ORAL_TABLET | Freq: Every day | ORAL | Status: DC
  Administered 2018-09-24 – 2018-09-26 (×3): 20 mg via ORAL
  Filled 2018-09-23 (×3): qty 1

## 2018-09-23 NOTE — Progress Notes (Addendum)
PROGRESS NOTE                                                                                                                                                                                                             Patient Demographics:    Megan Porter, is a 78 y.o. female, DOB - 06-14-1941, ZCH:[id number]  Admit date - 09/21/2018   Admitting Physician Elwyn Reach, MD  Outpatient Primary MD for the patient is Tisovec, Fransico Him, MD  LOS - 2   Chief Complaint  Patient presents with  . Shortness of Breath  . Cough       Brief Narrative    78 y.o. female with medical history significant of rheumatoid arthritis, osteoporosis, history of breast cancer, hypertension comes with [place] of breath, fever cough, chest x-ray significant for pneumonia .   Subjective:    [name] today for some mild dyspnea, cough, nonproductive, generalized weakness    Assessment  & Plan :    Principal Problem:   Pneumonia Active Problems:   Rheumatoid arthritis, seronegative, multiple sites ([place])   History of breast cancer   Hypokalemia   Benign essential HTN   Leucocytosis   Sepsis to community-acquired pneumonia -Sepsis present on admission, febrile, tachycardic, tachypneic, with elevated lactic acid, with acute respiratory failure -Due to community-acquired pneumonia, x-ray significant for right upper lung pneumonia, blood cultures, sputum cultures so far with no growth.  Strep pneumonia antigen negative -Continue with IV Rocephin and azithromycin for CAP coverage -Still with leukocytosis  Acute hypoxic respiratory failure -Desaturating on room air, still on 2 L nasal cannula today, she was encouraged again to ambulate, use incentive spirometry and flutter valve , continue with Mucinex . -Change IV steroids to p.o. tomorrow.  history of rheumatoid arthritis: -Hold meds during hospital stay  history of breast cancer:  - patient is currently  stable.  Defer treatment to her oncologist.  hypokalemia:  -Corrected  hypertension:  - patient Is not on any home medications, blood pressure is acceptable  Hypothyroidism -continue with  Synthroid   Code Status : Full  Family Communication  : None at bedside  Disposition Plan  : PT consult pending  Consults  :  None  Procedures  : None  DVT Prophylaxis  :  [place] lovenox  Lab Results  Component Value Date  PLT 207 09/23/2018    Antibiotics  :    Anti-infectives (From admission, onward)   Start     Dose/Rate Route Frequency Ordered Stop   09/23/18 2200  azithromycin (ZITHROMAX) tablet 500 mg     500 mg Oral Daily at bedtime 09/23/18 0947 09/28/18 2159   09/22/18 2030  azithromycin (ZITHROMAX) 500 mg in sodium chloride 0.9 % 250 mL IVPB  Status:  Discontinued     500 mg 250 mL/hr over 60 Minutes Intravenous Every 24 hours 09/21/18 2309 09/23/18 0947   09/22/18 2000  cefTRIAXone (ROCEPHIN) 1 g in sodium chloride 0.9 % 100 mL IVPB     1 g 200 mL/hr over 30 Minutes Intravenous Every 24 hours 09/21/18 2309 09/29/18 1959   09/22/18 1000  hydroxychloroquine (PLAQUENIL) tablet 200 mg     200 mg Oral Daily 09/21/18 2308     09/21/18 2115  cefTRIAXone (ROCEPHIN) 2 g in sodium chloride 0.9 % 100 mL IVPB  Status:  Discontinued     2 g 200 mL/hr over 30 Minutes Intravenous Every 24 hours 09/21/18 2107 09/21/18 2330   09/21/18 2115  azithromycin (ZITHROMAX) 500 mg in sodium chloride 0.9 % 250 mL IVPB  Status:  Discontinued     500 mg 250 mL/hr over 60 Minutes Intravenous Every 24 hours 09/21/18 2107 09/21/18 2330        Objective:   Vitals:   09/22/18 0534 09/22/18 2028 09/22/18 2147 09/23/18 0424  BP: 117/67  (!) 142/71 127/63  Pulse: 86  (!) 101 83  Resp: 20  18 16   Temp: 97.8 F (36.6 C)  98.7 F (37.1 C) 98.5 F (36.9 C)  TempSrc: Oral  Oral Oral  SpO2: 98% 98% 95% 95%  Weight:      Height:        Wt Readings from Last 3 Encounters:  09/21/18 58.1 kg    08/01/18 58.4 kg  03/28/18 59.4 kg     Intake/Output Summary (Last 24 hours) at 09/23/2018 1026 Last data filed at 09/22/2018 1503 Gross per 24 hour  Intake 0 ml  Output 350 ml  Net -350 ml     Physical Exam  Awake Alert, Oriented X 3, No new F.N deficits, Normal affect Symmetrical Chest wall movement, Good air movement bilaterally, right upper lung Rales RRR,No Gallops,Rubs or new Murmurs, No Parasternal Heave +ve B.Sounds, Abd Soft, No tenderness, No rebound - guarding or rigidity. No Cyanosis, Clubbing or edema, No new Rash or bruise       Data Review:    CBC Recent Labs  Lab 09/21/18 2031 09/23/18 0532  WBC 16.6* 17.2*  HGB 12.1 9.8*  HCT 37.1 30.2*  PLT 212 207  MCV 102.8* 104.1*  MCH 33.5 33.8  MCHC 32.6 32.5  RDW 13.0 13.2  LYMPHSABS 0.7  --   MONOABS 1.0  --   EOSABS 0.2  --   BASOSABS 0.2*  --     Chemistries  Recent Labs  Lab 09/21/18 2031 09/22/18 0440 09/23/18 0532  NA 138 144 144  K 2.8* 3.3* 4.0  CL 98 112* 108  CO2 25 21* 27  GLUCOSE 128* 158* 146*  BUN 10 8 11   CREATININE 0.87 0.71 0.75  CALCIUM 8.8* 8.0* 8.2*  AST 41 33  --   ALT 59* 48*  --   ALKPHOS 127* 106  --   BILITOT 1.1 0.5  --    ------------------------------------------------------------------------------------------------------------------ No results for input(s): CHOL, HDL, LDLCALC, TRIG, CHOLHDL, LDLDIRECT in  the last 72 hours.  No results found for: HGBA1C ------------------------------------------------------------------------------------------------------------------ No results for input(s): TSH, T4TOTAL, T3FREE, THYROIDAB in the last 72 hours.  Invalid input(s): FREET3 ------------------------------------------------------------------------------------------------------------------ No results for input(s): VITAMINB12, FOLATE, FERRITIN, TIBC, IRON, RETICCTPCT in the last 72 hours.  Coagulation profile No results for input(s): INR, PROTIME in the last 168  hours.  No results for input(s): DDIMER in the last 72 hours.  Cardiac Enzymes No results for input(s): CKMB, TROPONINI, MYOGLOBIN in the last 168 hours.  Invalid input(s): CK ------------------------------------------------------------------------------------------------------------------ No results found for: BNP  Inpatient Medications  Scheduled Meds: . azithromycin  500 mg Oral QHS  . calcium citrate  200 mg of elemental calcium Oral Daily  . cholecalciferol  1,000 Units Oral Daily  . citalopram  40 mg Oral Daily  . enoxaparin (LOVENOX) injection  40 mg Subcutaneous Q24H  . folic acid  1 mg Oral Daily  . guaiFENesin  1,200 mg Oral BID  . hydroxychloroquine  200 mg Oral Daily  . levothyroxine  50 mcg Oral QAC breakfast  . methylPREDNISolone (SOLU-MEDROL) injection  40 mg Intravenous Q12H  . multivitamin with minerals  1 tablet Oral Daily  . raloxifene  60 mg Oral Daily  . traZODone  50 mg Oral QHS   Continuous Infusions: . sodium chloride 1,000 mL (09/23/18 0724)  . cefTRIAXone (ROCEPHIN)  IV 1 g (09/22/18 2138)   PRN Meds:.  Micro Results Recent Results (from the past 240 hour(s))  Blood Culture (routine x 2)     Status: None (Preliminary result)   Collection Time: 09/21/18  8:41 PM  Result Value Ref Range Status   Specimen Description BLOOD RIGHT ANTECUBITAL  Final   Special Requests   Final    BOTTLES DRAWN AEROBIC AND ANAEROBIC Blood Culture adequate volume   Culture   Final    NO GROWTH 2 DAYS Performed at [facility], [address] [place], [place] [address]    Report Status PENDING  Incomplete  Blood Culture (routine x 2)     Status: None (Preliminary result)   Collection Time: 09/21/18  9:34 PM  Result Value Ref Range Status   Specimen Description BLOOD LEFT ARM  Final   Special Requests   Final    BOTTLES DRAWN AEROBIC ONLY Blood Culture results may not be optimal due to an excessive volume of blood received in culture bottles   Culture    Final    NO GROWTH 2 DAYS Performed at [facility], [place] [address] [place], [place] [address]    Report Status PENDING  Incomplete    Radiology Reports Dg Chest 2 View  Result Date: 09/21/2018 CLINICAL DATA:  Dyspnea, cough and fever EXAM: CHEST - 2 VIEW COMPARISON:  03/04/2011 CXR and chest CT 04/23/2017 FINDINGS: Normal heart size and mediastinal contours. Confluent opacity in the right upper lobe posterior segment suspicious for pneumonia with more streaky parenchymal opacities likely representing atelectasis in the upper lobes, lingula and left base. No effusion or pneumothorax. Osteopenia without acute osseous abnormality. IMPRESSION: Dominant finding is that of right upper lobe airspace disease consistent with right upper lobe pneumonia. Followup PA and lateral chest X-ray is recommended in 3-4 weeks following trial of antibiotic therapy to ensure resolution and exclude underlying malignancy. Electronically Signed   By: Ashley Royalty M.D.   On: 09/21/2018 21:02     Phillips Climes M.D on 09/23/2018 at 10:26 AM  Between 7am to 7pm - Pager - [phone]  After 7pm  go to [email or web address] - password [facility]  [facility] -  Office  [phone]

## 2018-09-23 NOTE — Progress Notes (Signed)
PHARMACIST - PHYSICIAN COMMUNICATION DR:   Elgergawy CONCERNING: Antibiotic IV to Oral Route Change Policy  RECOMMENDATION: This patient is receiving azithromycin by the intravenous route.  Based on criteria approved by the Pharmacy and Therapeutics Committee, the antibiotic(s) is/are being converted to the equivalent oral dose form(s).   DESCRIPTION: These criteria include:  Patient being treated for a respiratory tract infection, urinary tract infection, cellulitis or clostridium difficile associated diarrhea if on metronidazole  The patient is not neutropenic and does not exhibit a GI malabsorption state  The patient is eating (either orally or via tube) and/or has been taking other orally administered medications for a least 24 hours  The patient is improving clinically and has a Tmax < 100.5  If you have questions about this conversion, please contact the Pharmacy Department  []   [phone] )  [facility] []   [phone] )  [facility] [x]   [phone] )  [facility] []   [phone] )  [facility] []   [phone] )  [facility]

## 2018-09-24 DIAGNOSIS — J9601 Acute respiratory failure with hypoxia: Secondary | ICD-10-CM

## 2018-09-24 DIAGNOSIS — I10 Essential (primary) hypertension: Secondary | ICD-10-CM

## 2018-09-24 DIAGNOSIS — J181 Lobar pneumonia, unspecified organism: Secondary | ICD-10-CM

## 2018-09-24 LAB — CBC
HCT: 33.1 % — ABNORMAL LOW (ref 36.0–46.0)
Hemoglobin: 10.8 g/dL — ABNORMAL LOW (ref 12.0–15.0)
MCH: 34 pg (ref 26.0–34.0)
MCHC: 32.6 g/dL (ref 30.0–36.0)
MCV: 104.1 fL — ABNORMAL HIGH (ref 80.0–100.0)
Platelets: 297 10*3/uL (ref 150–400)
RBC: 3.18 MIL/uL — ABNORMAL LOW (ref 3.87–5.11)
RDW: 13.2 % (ref 11.5–15.5)
WBC: 16.5 10*3/uL — AB (ref 4.0–10.5)
nRBC: 0 % (ref 0.0–0.2)

## 2018-09-24 LAB — BASIC METABOLIC PANEL
Anion gap: 7 (ref 5–15)
BUN: 14 mg/dL (ref 8–23)
CO2: 28 mmol/L (ref 22–32)
Calcium: 8.2 mg/dL — ABNORMAL LOW (ref 8.9–10.3)
Chloride: 108 mmol/L (ref 98–111)
Creatinine, Ser: 0.74 mg/dL (ref 0.44–1.00)
GFR calc Af Amer: >60 mL/min (ref >60)
GFR calc non Af Amer: >60 mL/min (ref >60)
Glucose, Bld: 106 mg/dL — ABNORMAL HIGH (ref 70–99)
POTASSIUM: 3.8 mmol/L (ref 3.5–5.1)
Sodium: 143 mmol/L (ref 135–145)

## 2018-09-24 LAB — RESPIRATORY PANEL BY PCR
Adenovirus: NOT DETECTED
Bordetella pertussis: NOT DETECTED
CORONAVIRUS OC43-RVPPCR: NOT DETECTED
Chlamydophila pneumoniae: NOT DETECTED
Coronavirus 229E: NOT DETECTED
Coronavirus HKU1: NOT DETECTED
Coronavirus NL63: NOT DETECTED
Influenza A: NOT DETECTED
Influenza B: NOT DETECTED
Metapneumovirus: NOT DETECTED
Mycoplasma pneumoniae: NOT DETECTED
PARAINFLUENZA VIRUS 1-RVPPCR: NOT DETECTED
Parainfluenza Virus 2: NOT DETECTED
Parainfluenza Virus 3: NOT DETECTED
Parainfluenza Virus 4: NOT DETECTED
Respiratory Syncytial Virus: NOT DETECTED
Rhinovirus / Enterovirus: NOT DETECTED

## 2018-09-24 LAB — BRAIN NATRIURETIC PEPTIDE: B Natriuretic Peptide: 812.8 pg/mL — ABNORMAL HIGH (ref 0.0–100.0)

## 2018-09-24 MED ORDER — DOCUSATE SODIUM 100 MG PO CAPS
200.0000 mg | ORAL_CAPSULE | Freq: Every day | ORAL | Status: DC | PRN
  Administered 2018-09-24 – 2018-09-25 (×2): 200 mg via ORAL
  Filled 2018-09-24 (×2): qty 2

## 2018-09-24 MED ORDER — SODIUM CHLORIDE 0.9 % IV SOLN
2.0000 g | INTRAVENOUS | Status: DC
  Administered 2018-09-24 – 2018-09-26 (×3): 2 g via INTRAVENOUS
  Filled 2018-09-24 (×5): qty 2

## 2018-09-24 MED ORDER — SODIUM CHLORIDE 0.9 % IV SOLN: 1000.0000 mL | INTRAVENOUS | Status: DC

## 2018-09-24 NOTE — Progress Notes (Signed)
Pt was admitted for PNA. She was started on ceftriaxone and azith for CAP. Her strep antigen was neg and resp panel is pending. Currently afebrile. Dr. Sloan Leiter said that she looks much worse today and would like to change the ceftriaxone to cefepime for now. Prob might not needed but will change and eval tomorrow.  CrCr~47  Cefepime 2g IV q24  Onnie Boer, PharmD, [place], AAHIVP, CPP Infectious Disease Pharmacist 09/24/2018 12:57 PM

## 2018-09-24 NOTE — Progress Notes (Signed)
PROGRESS NOTE        PATIENT DETAILS Name: Megan Porter Age: 78 y.o. Sex: female Date of Birth: [birth year] Admit Date: 09/21/2018 Admitting Physician Elwyn Reach, MD IFO:YDXAJOI, Fransico Him, MD  Brief Narrative: Patient is a 78 y.o. female with history of RA (on methotrexate, Plaquenil), hypothyroidism breast cancer acute hypoxic respiratory failure in the setting of pneumonia.  See below for further details  Subjective: Does not think she is getting any better than on initial presentation-she is short of breath with minimal activity-she continues to cough.  Assessment/Plan: Acute hypoxic respiratory failure secondary to pneumonia: Not much clinical improvement since admission-continues to have leukocytosis (but patient on steroids)-respiratory virus panel and blood cultures negative so far.  Does not have any signs of volume overload.  For now will broaden antimicrobial therapy coverage by changing Rocephin to cefepime.  We will repeat chest x-ray tomorrow-and reassess-might require a CT scan of her chest at some point.  History of seronegative rheumatoid arthritis: Continue to hold methotrexate-doubt hypersensitivity pneumonitis at this point causing above.  However if her clinical status does not improve-may need to consider this diagnosis-however patient is already on steroids.  We will go and hold Plaquenil as well.  Hypothyroidism: Continue Synthroid  Hypertension: BP controlled-continue to monitor off antihypertensives  Breast cancer: Follow with oncology as outpatient  DVT Prophylaxis: Prophylactic Lovenox   Code Status: Full code   Family Communication: None at bedside  Disposition Plan: Remain inpatient and requires several more days of hospitalization.  Antimicrobial agents: Anti-infectives (From admission, onward)   Start     Dose/Rate Route Frequency Ordered Stop   09/24/18 1200  ceFEPIme (MAXIPIME) 2 g in sodium chloride 0.9 % 100  mL IVPB     2 g 200 mL/hr over 30 Minutes Intravenous Every 24 hours 09/24/18 1126     09/23/18 2200  azithromycin (ZITHROMAX) tablet 500 mg     500 mg Oral Daily at bedtime 09/23/18 0947 09/28/18 2159   09/22/18 2030  azithromycin (ZITHROMAX) 500 mg in sodium chloride 0.9 % 250 mL IVPB  Status:  Discontinued     500 mg 250 mL/hr over 60 Minutes Intravenous Every 24 hours 09/21/18 2309 09/23/18 0947   09/22/18 2000  cefTRIAXone (ROCEPHIN) 1 g in sodium chloride 0.9 % 100 mL IVPB  Status:  Discontinued     1 g 200 mL/hr over 30 Minutes Intravenous Every 24 hours 09/21/18 2309 09/24/18 1126   09/22/18 1000  hydroxychloroquine (PLAQUENIL) tablet 200 mg     200 mg Oral Daily 09/21/18 2308     09/21/18 2115  cefTRIAXone (ROCEPHIN) 2 g in sodium chloride 0.9 % 100 mL IVPB  Status:  Discontinued     2 g 200 mL/hr over 30 Minutes Intravenous Every 24 hours 09/21/18 2107 09/21/18 2330   09/21/18 2115  azithromycin (ZITHROMAX) 500 mg in sodium chloride 0.9 % 250 mL IVPB  Status:  Discontinued     500 mg 250 mL/hr over 60 Minutes Intravenous Every 24 hours 09/21/18 2107 09/21/18 2330      Procedures: None  CONSULTS:  None  Time spent: 25- minutes-Greater than 50% of this time was spent in counseling, explanation of diagnosis, planning of further management, and coordination of care.  MEDICATIONS: Scheduled Meds: . azithromycin  500 mg Oral QHS  . calcium citrate  200 mg of elemental  calcium Oral Daily  . cholecalciferol  1,000 Units Oral Daily  . citalopram  40 mg Oral Daily  . enoxaparin (LOVENOX) injection  40 mg Subcutaneous Q24H  . folic acid  1 mg Oral Daily  . guaiFENesin  1,200 mg Oral BID  . hydroxychloroquine  200 mg Oral Daily  . levothyroxine  50 mcg Oral QAC breakfast  . multivitamin with minerals  1 tablet Oral Daily  . predniSONE  20 mg Oral Q breakfast  . raloxifene  60 mg Oral Daily  . traZODone  50 mg Oral QHS   Continuous Infusions: . sodium chloride 1,000 mL  (09/23/18 1744)  . ceFEPime (MAXIPIME) IV 2 g (09/24/18 1153)   PRN Meds:.acetaminophen, albuterol, guaiFENesin-dextromethorphan   PHYSICAL EXAM: Vital signs: Vitals:   09/23/18 0424 09/23/18 1140 09/24/18 0249 09/24/18 0355  BP: 127/63 (!) 141/67  (!) 129/56  Pulse: 83 81  100  Resp: 16 17  20   Temp: 98.5 F (36.9 C) 98.2 F (36.8 C)  98.5 F (36.9 C)  TempSrc: Oral Oral  Oral  SpO2: 95% 95% 95% 94%  Weight:      Height:       Filed Weights   09/21/18 2316  Weight: 58.1 kg   Body mass index is 22.69 kg/m.   General appearance :Awake, alert, not in any distress.  Eyes:Pink conjunctiva HEENT: Atraumatic and Normocephalic Neck: supple Resp:Good air entry bilaterally- has rales bibasilar CVS: S1 S2 regular GI: Bowel sounds present, Non tender and not distended with no gaurding, rigidity or rebound.No organomegaly Extremities: B/L Lower Ext shows no edema, both legs are warm to touch Neurology:  speech clear,Non focal, sensation is grossly intact. Musculoskeletal:No digital cyanosis Skin:No Rash, warm and dry Wounds:N/A  I have personally reviewed following labs and imaging studies  LABORATORY DATA: CBC: Recent Labs  Lab 09/21/18 2031 09/23/18 0532 09/24/18 0656  WBC 16.6* 17.2* 16.5*  NEUTROABS 14.5*  --   --   HGB 12.1 9.8* 10.8*  HCT 37.1 30.2* 33.1*  MCV 102.8* 104.1* 104.1*  PLT 212 207 323    Basic Metabolic Panel: Recent Labs  Lab 09/21/18 2031 09/22/18 0440 09/23/18 0532 09/24/18 0656  NA 138 144 144 143  K 2.8* 3.3* 4.0 3.8  CL 98 112* 108 108  CO2 25 21* 27 28  GLUCOSE 128* 158* 146* 106*  BUN 10 8 11 14   CREATININE 0.87 0.71 0.75 0.74  CALCIUM 8.8* 8.0* 8.2* 8.2*    GFR: Estimated Creatinine Clearance: 47.2 mL/min (by C-G formula based on SCr of 0.74 mg/dL).  Liver Function Tests: Recent Labs  Lab 09/21/18 2031 09/22/18 0440  AST 41 33  ALT 59* 48*  ALKPHOS 127* 106  BILITOT 1.1 0.5  PROT 7.8 5.7*  ALBUMIN 3.0* 2.1*    No results for input(s): LIPASE, AMYLASE in the last 168 hours. No results for input(s): AMMONIA in the last 168 hours.  Coagulation Profile: No results for input(s): INR, PROTIME in the last 168 hours.  Cardiac Enzymes: No results for input(s): CKTOTAL, CKMB, CKMBINDEX, TROPONINI in the last 168 hours.  BNP (last 3 results) No results for input(s): PROBNP in the last 8760 hours.  HbA1C: No results for input(s): HGBA1C in the last 72 hours.  CBG: No results for input(s): GLUCAP in the last 168 hours.  Lipid Profile: No results for input(s): CHOL, HDL, LDLCALC, TRIG, CHOLHDL, LDLDIRECT in the last 72 hours.  Thyroid Function Tests: No results for input(s): TSH, T4TOTAL, FREET4, T3FREE, THYROIDAB  in the last 72 hours.  Anemia Panel: No results for input(s): VITAMINB12, FOLATE, FERRITIN, TIBC, IRON, RETICCTPCT in the last 72 hours.  Urine analysis:    Component Value Date/Time   COLORURINE YELLOW 09/22/2018 0404   APPEARANCEUR CLEAR 09/22/2018 0404   LABSPEC 1.008 09/22/2018 0404   PHURINE 6.0 09/22/2018 0404   GLUCOSEU NEGATIVE 09/22/2018 0404   HGBUR SMALL (A) 09/22/2018 0404   BILIRUBINUR NEGATIVE 09/22/2018 0404   KETONESUR 5 (A) 09/22/2018 0404   PROTEINUR 30 (A) 09/22/2018 0404   NITRITE NEGATIVE 09/22/2018 0404   LEUKOCYTESUR NEGATIVE 09/22/2018 0404    Sepsis Labs: Lactic Acid, Venous    Component Value Date/Time   LATICACIDVEN 1.79 09/21/2018 2056    MICROBIOLOGY: Recent Results (from the past 240 hour(s))  Blood Culture (routine x 2)     Status: None (Preliminary result)   Collection Time: 09/21/18  8:41 PM  Result Value Ref Range Status   Specimen Description BLOOD RIGHT ANTECUBITAL  Final   Special Requests   Final    BOTTLES DRAWN AEROBIC AND ANAEROBIC Blood Culture adequate volume   Culture   Final    NO GROWTH 3 DAYS Performed at [facility], [place] [address] [place], [place] [address]    Report Status PENDING  Incomplete  Blood  Culture (routine x 2)     Status: None (Preliminary result)   Collection Time: 09/21/18  9:34 PM  Result Value Ref Range Status   Specimen Description BLOOD LEFT ARM  Final   Special Requests   Final    BOTTLES DRAWN AEROBIC ONLY Blood Culture results may not be optimal due to an excessive volume of blood received in culture bottles   Culture   Final    NO GROWTH 3 DAYS Performed at [facility], [place] [address], [place], [place] [address]    Report Status PENDING  Incomplete    RADIOLOGY STUDIES/RESULTS: Dg Chest 2 View  Result Date: 09/21/2018 CLINICAL DATA:  Dyspnea, cough and fever EXAM: CHEST - 2 VIEW COMPARISON:  03/04/2011 CXR and chest CT 04/23/2017 FINDINGS: Normal heart size and mediastinal contours. Confluent opacity in the right upper lobe posterior segment suspicious for pneumonia with more streaky parenchymal opacities likely representing atelectasis in the upper lobes, lingula and left base. No effusion or pneumothorax. Osteopenia without acute osseous abnormality. IMPRESSION: Dominant finding is that of right upper lobe airspace disease consistent with right upper lobe pneumonia. Followup PA and lateral chest X-ray is recommended in 3-4 weeks following trial of antibiotic therapy to ensure resolution and exclude underlying malignancy. Electronically Signed   By: Ashley Royalty M.D.   On: 09/21/2018 21:02     LOS: 3 days   Oren Binet, MD  [facility]  If 7PM-7AM, please contact night-coverage  Please page via [email or web address] [email or web address]-click on MD name and type text message  09/24/2018, 2:08 PM

## 2018-09-24 NOTE — Progress Notes (Signed)
Physical Therapy Treatment Patient Details Name: Megan Porter MRN: [id number] DOB: [birth year] Today's Date: 09/24/2018    History of Present Illness Patient is a 78 y/o female presenting to the ED on 09/21/17 with primary complaints of shortness of breath. Chest x-ray showed right upper lobe airspace disease consistent with right upper lobe pneumonia. Past medical history significant of rheumatoid arthritis, osteoporosis, history of breast cancer, hypertension.     PT Comments    Pt performed gait training without pushing IV pole and cues for reciprocal armswing. She is progressing well and remains on track to return home without PT follow up.  Plan next session for stair negotiation.   Follow Up Recommendations  No PT follow up;Supervision - Intermittent     Equipment Recommendations  None recommended by PT    Recommendations for Other Services       Precautions / Restrictions Precautions Precautions: Fall Restrictions Weight Bearing Restrictions: No    Mobility  Bed Mobility Overal bed mobility: Modified Independent                Transfers Overall transfer level: Needs assistance Equipment used: None Transfers: Sit to/from Stand Sit to Stand: Supervision         General transfer comment: Supervision for safety during transitions.    Ambulation/Gait Ambulation/Gait assistance: Supervision Gait Distance (Feet): 120 Feet Assistive device: None Gait Pattern/deviations: Step-through pattern;Decreased stride length;Narrow base of support Gait velocity: decreased   General Gait Details: No LOB cues for pacing still requires 3L O2 [place].  Pt fatigues with activity and required cues for reciprocal armswing.     Stairs             Wheelchair Mobility    Modified Rankin (Stroke Patients Only)       Balance Overall balance assessment: Mild deficits observed, not formally tested                                          Cognition  Arousal/Alertness: Awake/alert Behavior During Therapy: WFL for tasks assessed/performed Overall Cognitive Status: Within Functional Limits for tasks assessed                                        Exercises      General Comments General comments (skin integrity, edema, etc.): 3L supplemental 02 to maintain SPO2.        Pertinent Vitals/Pain Pain Assessment: No/denies pain    Home Living                      Prior Function            PT Goals (current goals can now be found in the care plan section) Acute Rehab PT Goals Patient Stated Goal: return home soon Potential to Achieve Goals: Good Progress towards PT goals: Progressing toward goals    Frequency    Min 3X/week      PT Plan Current plan remains appropriate    Co-evaluation              AM-PAC PT "6 Clicks" Mobility   Outcome Measure  Help needed turning from your back to your side while in a flat bed without using bedrails?: None Help needed moving from lying on your back to sitting on the side  of a flat bed without using bedrails?: None Help needed moving to and from a bed to a chair (including a wheelchair)?: A Little Help needed standing up from a chair using your arms (e.g., wheelchair or bedside chair)?: A Little Help needed to walk in hospital room?: A Little Help needed climbing 3-5 steps with a railing? : A Little 6 Click Score: 20    End of Session Equipment Utilized During Treatment: Gait belt;Oxygen Activity Tolerance: Patient tolerated treatment well Patient left: in bed;with call bell/phone within reach Nurse Communication: Mobility status PT Visit Diagnosis: Unsteadiness on feet (R26.81);Other abnormalities of gait and mobility (R26.89)     Time: 5638-7564 PT Time Calculation (min) (ACUTE ONLY): 28 min  Charges:  $Gait Training: 8-22 mins $Therapeutic Activity: 8-22 mins                     Governor Rooks, PTA Acute Rehabilitation Services Pager  [phone] Office [phone]     Carlin Mamone Eli Hose 09/24/2018, 3:42 PM

## 2018-09-25 ENCOUNTER — Inpatient Hospital Stay (HOSPITAL_COMMUNITY): Payer: Medicare Other

## 2018-09-25 LAB — COMPREHENSIVE METABOLIC PANEL
ALBUMIN: 1.8 g/dL — AB (ref 3.5–5.0)
ALT: 30 U/L (ref 0–44)
AST: 18 U/L (ref 15–41)
Alkaline Phosphatase: 70 U/L (ref 38–126)
Anion gap: 8 (ref 5–15)
BUN: 7 mg/dL — ABNORMAL LOW (ref 8–23)
CHLORIDE: 104 mmol/L (ref 98–111)
CO2: 30 mmol/L (ref 22–32)
Calcium: 7.6 mg/dL — ABNORMAL LOW (ref 8.9–10.3)
Creatinine, Ser: 0.52 mg/dL (ref 0.44–1.00)
GFR calc Af Amer: >60 mL/min (ref >60)
GFR calc non Af Amer: >60 mL/min (ref >60)
Glucose, Bld: 100 mg/dL — ABNORMAL HIGH (ref 70–99)
Potassium: 3.1 mmol/L — ABNORMAL LOW (ref 3.5–5.1)
Sodium: 142 mmol/L (ref 135–145)
Total Bilirubin: 0.6 mg/dL (ref 0.3–1.2)
Total Protein: 5.1 g/dL — ABNORMAL LOW (ref 6.5–8.1)

## 2018-09-25 LAB — CBC
HCT: 31.2 % — ABNORMAL LOW (ref 36.0–46.0)
Hemoglobin: 10 g/dL — ABNORMAL LOW (ref 12.0–15.0)
MCH: 32.7 pg (ref 26.0–34.0)
MCHC: 32.1 g/dL (ref 30.0–36.0)
MCV: 102 fL — ABNORMAL HIGH (ref 80.0–100.0)
NRBC: 0 % (ref 0.0–0.2)
Platelets: 248 10*3/uL (ref 150–400)
RBC: 3.06 MIL/uL — ABNORMAL LOW (ref 3.87–5.11)
RDW: 12.9 % (ref 11.5–15.5)
WBC: 12.5 10*3/uL — ABNORMAL HIGH (ref 4.0–10.5)

## 2018-09-25 LAB — ECHOCARDIOGRAM COMPLETE
Height: 63 in
Weight: 2049.4 oz

## 2018-09-25 MED ORDER — POTASSIUM CHLORIDE CRYS ER 20 MEQ PO TBCR
40.0000 meq | EXTENDED_RELEASE_TABLET | Freq: Once | ORAL | Status: AC
  Administered 2018-09-25: 40 meq via ORAL
  Filled 2018-09-25: qty 2

## 2018-09-25 MED ORDER — FUROSEMIDE 10 MG/ML IJ SOLN
40.0000 mg | Freq: Once | INTRAMUSCULAR | Status: AC
  Administered 2018-09-25: 40 mg via INTRAVENOUS
  Filled 2018-09-25 (×2): qty 4

## 2018-09-25 MED ORDER — ORAL CARE MOUTH RINSE
15.0000 mL | Freq: Two times a day (BID) | OROMUCOSAL | Status: DC
  Administered 2018-09-25 – 2018-09-26 (×3): 15 mL via OROMUCOSAL

## 2018-09-25 NOTE — Care Management Important Message (Signed)
Important Message  Patient Details  Name: Megan Porter MRN: [id number] Date of Birth: April 05, 1941   Medicare Important Message Given:  Yes    Orbie Pyo 09/25/2018, 2:52 PM

## 2018-09-25 NOTE — Progress Notes (Signed)
Physical Therapy Treatment and Discharge Patient Details Name: Megan Porter MRN: [id number] DOB: Aug 11, 1941 Today's Date: 09/25/2018    History of Present Illness Patient is a 78 y/o female presenting to the ED on 09/21/17 with primary complaints of shortness of breath. Chest x-ray showed right upper lobe airspace disease consistent with right upper lobe pneumonia. Past medical history significant of rheumatoid arthritis, osteoporosis, history of breast cancer, hypertension.     PT Comments    Pt is walking with no AD on the hall, noted her control of gait is quite good although pattern is altered by her chronic health issues.  Follow up after today is not needed so will DC physical therapy today due to having met all her goals.   Follow Up Recommendations  No PT follow up;Supervision - Intermittent     Equipment Recommendations  None recommended by PT    Recommendations for Other Services       Precautions / Restrictions Precautions Precautions: Fall Restrictions Weight Bearing Restrictions: No    Mobility  Bed Mobility Overal bed mobility: Modified Independent                Transfers Overall transfer level: Modified independent                  Ambulation/Gait Ambulation/Gait assistance: Supervision Gait Distance (Feet): 300 Feet Assistive device: None Gait Pattern/deviations: Step-through pattern;Decreased stride length;Narrow base of support Gait velocity: decreased Gait velocity interpretation: <1.31 ft/sec, indicative of household ambulator General Gait Details: managed O2 tank for pt to conserve energy for longer walk and to climb stairs   Stairs Stairs: Yes Stairs assistance: Supervision Stair Management: One rail Right;Forwards;Backwards Number of Stairs: 2 General stair comments: pt reports she is going to need to climb only 2 steps to enter her house   Wheelchair Mobility    Modified Rankin (Stroke Patients Only)       Balance  Overall balance assessment: Needs assistance Sitting-balance support: Feet supported Sitting balance-Leahy Scale: Good     Standing balance support: No upper extremity supported Standing balance-Leahy Scale: Fair                              Cognition Arousal/Alertness: Awake/alert Behavior During Therapy: WFL for tasks assessed/performed Overall Cognitive Status: Within Functional Limits for tasks assessed                                        Exercises      General Comments General comments (skin integrity, edema, etc.): 3L O2 in tank durign walk      Pertinent Vitals/Pain Pain Assessment: No/denies pain    Home Living                      Prior Function            PT Goals (current goals can now be found in the care plan section) Acute Rehab PT Goals Patient Stated Goal: return home soon Progress towards PT goals: Progressing toward goals    Frequency    Min 3X/week      PT Plan Current plan remains appropriate    Co-evaluation              AM-PAC PT "6 Clicks" Mobility   Outcome Measure  Help needed turning from your back to your  side while in a flat bed without using bedrails?: None Help needed moving from lying on your back to sitting on the side of a flat bed without using bedrails?: None Help needed moving to and from a bed to a chair (including a wheelchair)?: None Help needed standing up from a chair using your arms (e.g., wheelchair or bedside chair)?: None Help needed to walk in hospital room?: A Little Help needed climbing 3-5 steps with a railing? : A Little 6 Click Score: 22    End of Session Equipment Utilized During Treatment: Gait belt;Oxygen Activity Tolerance: Patient tolerated treatment well Patient left: in bed;with call bell/phone within reach Nurse Communication: Mobility status PT Visit Diagnosis: Unsteadiness on feet (R26.81);Other abnormalities of gait and mobility (R26.89)      Time: 9767-3419 PT Time Calculation (min) (ACUTE ONLY): 15 min  Charges:  $Gait Training: 8-22 mins                   Ramond Dial 09/25/2018, 3:57 PM   Mee Hives, PT MS Acute Rehab Dept. Number: [facility] [facility] [place]

## 2018-09-25 NOTE — Progress Notes (Signed)
PROGRESS NOTE        PATIENT DETAILS Name: Megan Porter Age: 78 y.o. Sex: female Date of Birth: [birth year] Admit Date: 09/21/2018 Admitting Physician Elwyn Reach, MD IOM:[id number], Fransico Him, MD  Brief Narrative: Patient is a 78 y.o. female with history of RA (on methotrexate, Plaquenil), hypothyroidism breast cancer acute hypoxic respiratory failure in the setting of pneumonia.  See below for further details  Subjective: Feels somewhat better today-less cough-claims she is less short of breath this morning.  Antibiotics were changed to cefepime yesterday.  Assessment/Plan: Acute hypoxic respiratory failure secondary to pneumonia: Improved compared to yesterday-less cough and shortness of breath.  Continue cefepime and Zithromax.  Respiratory virus panel and blood cultures negative so far.  BNP slightly elevated-no obvious signs of volume overload-we will give 1 dose of IV Lasix to see if this will improve her symptoms further.  Chest x-ray done this morning shows worsening multifocal pneumonia.  Nursing staff to attempt to slowly titrate off oxygen over the next few days  History of seronegative rheumatoid arthritis: Stable-continue to hold methotrexate and Plaquenil.    Hypothyroidism: Continue Synthroid  Hypertension: BP controlled-continue to monitor off antihypertensives.   Breast cancer: Follow with oncology as outpatient  DVT Prophylaxis: Prophylactic Lovenox   Code Status: Full code   Family Communication: None at bedside  Disposition Plan: Remain inpatient-requires several more days of hospital stay  Antimicrobial agents: Anti-infectives (From admission, onward)   Start     Dose/Rate Route Frequency Ordered Stop   09/24/18 1200  ceFEPIme (MAXIPIME) 2 g in sodium chloride 0.9 % 100 mL IVPB     2 g 200 mL/hr over 30 Minutes Intravenous Every 24 hours 09/24/18 1126     09/23/18 2200  azithromycin (ZITHROMAX) tablet 500 mg     500 mg Oral  Daily at bedtime 09/23/18 0947 09/28/18 2159   09/22/18 2030  azithromycin (ZITHROMAX) 500 mg in sodium chloride 0.9 % 250 mL IVPB  Status:  Discontinued     500 mg 250 mL/hr over 60 Minutes Intravenous Every 24 hours 09/21/18 2309 09/23/18 0947   09/22/18 2000  cefTRIAXone (ROCEPHIN) 1 g in sodium chloride 0.9 % 100 mL IVPB  Status:  Discontinued     1 g 200 mL/hr over 30 Minutes Intravenous Every 24 hours 09/21/18 2309 09/24/18 1126   09/22/18 1000  hydroxychloroquine (PLAQUENIL) tablet 200 mg  Status:  Discontinued     200 mg Oral Daily 09/21/18 2308 09/24/18 1418   09/21/18 2115  cefTRIAXone (ROCEPHIN) 2 g in sodium chloride 0.9 % 100 mL IVPB  Status:  Discontinued     2 g 200 mL/hr over 30 Minutes Intravenous Every 24 hours 09/21/18 2107 09/21/18 2330   09/21/18 2115  azithromycin (ZITHROMAX) 500 mg in sodium chloride 0.9 % 250 mL IVPB  Status:  Discontinued     500 mg 250 mL/hr over 60 Minutes Intravenous Every 24 hours 09/21/18 2107 09/21/18 2330      Procedures: None  CONSULTS:  None  Time spent: 25- minutes-Greater than 50% of this time was spent in counseling, explanation of diagnosis, planning of further management, and coordination of care.  MEDICATIONS: Scheduled Meds: . azithromycin  500 mg Oral QHS  . calcium citrate  200 mg of elemental calcium Oral Daily  . cholecalciferol  1,000 Units Oral Daily  . citalopram  40  mg Oral Daily  . enoxaparin (LOVENOX) injection  40 mg Subcutaneous Q24H  . folic acid  1 mg Oral Daily  . guaiFENesin  1,200 mg Oral BID  . levothyroxine  50 mcg Oral QAC breakfast  . mouth rinse  15 mL Mouth Rinse BID  . multivitamin with minerals  1 tablet Oral Daily  . predniSONE  20 mg Oral Q breakfast  . raloxifene  60 mg Oral Daily  . traZODone  50 mg Oral QHS   Continuous Infusions: . sodium chloride    . ceFEPime (MAXIPIME) IV 2 g (09/24/18 1153)   PRN Meds:.acetaminophen, albuterol, docusate sodium   PHYSICAL EXAM: Vital  signs: Vitals:   09/24/18 1447 09/24/18 2123 09/25/18 0513 09/25/18 0800  BP: 136/63 (!) 158/73 (!) 151/71   Pulse: 89 87 85   Resp: 20 20 16    Temp: 98.5 F (36.9 C) 98.9 F (37.2 C) 98 F (36.7 C)   TempSrc: Oral Oral Oral   SpO2: 94% 94% 94% 94%  Weight:      Height:       Filed Weights   09/21/18 2316  Weight: 58.1 kg   Body mass index is 22.69 kg/m.   General appearance:Awake, alert, not in any distress.  Eyes:no scleral icterus. HEENT: Atraumatic and Normocephalic Neck: supple, no JVD. Resp:Good air entry bilaterally, rales bibasilar-up to mid lungs. CVS: S1 S2 regular, no murmurs.  GI: Bowel sounds present, Non tender and not distended with no gaurding, rigidity or rebound. Extremities: B/L Lower Ext shows trace edema, both legs are warm to touch Neurology:  Non focal Psychiatric: Normal judgment and insight. Normal mood. Musculoskeletal:No digital cyanosis Skin:No Rash, warm and dry Wounds:N/A  I have personally reviewed following labs and imaging studies  LABORATORY DATA: CBC: Recent Labs  Lab 09/21/18 2031 09/23/18 0532 09/24/18 0656 09/25/18 0433  WBC 16.6* 17.2* 16.5* 12.5*  NEUTROABS 14.5*  --   --   --   HGB 12.1 9.8* 10.8* 10.0*  HCT 37.1 30.2* 33.1* 31.2*  MCV 102.8* 104.1* 104.1* 102.0*  PLT 212 207 297 628    Basic Metabolic Panel: Recent Labs  Lab 09/21/18 2031 09/22/18 0440 09/23/18 0532 09/24/18 0656 09/25/18 0433  NA 138 144 144 143 142  K 2.8* 3.3* 4.0 3.8 3.1*  CL 98 112* 108 108 104  CO2 25 21* 27 28 30   GLUCOSE 128* 158* 146* 106* 100*  BUN 10 8 11 14  7*  CREATININE 0.87 0.71 0.75 0.74 0.52  CALCIUM 8.8* 8.0* 8.2* 8.2* 7.6*    GFR: Estimated Creatinine Clearance: 47.2 mL/min (by C-G formula based on SCr of 0.52 mg/dL).  Liver Function Tests: Recent Labs  Lab 09/21/18 2031 09/22/18 0440 09/25/18 0433  AST 41 33 18  ALT 59* 48* 30  ALKPHOS 127* 106 70  BILITOT 1.1 0.5 0.6  PROT 7.8 5.7* 5.1*  ALBUMIN 3.0*  2.1* 1.8*   No results for input(s): LIPASE, AMYLASE in the last 168 hours. No results for input(s): AMMONIA in the last 168 hours.  Coagulation Profile: No results for input(s): INR, PROTIME in the last 168 hours.  Cardiac Enzymes: No results for input(s): CKTOTAL, CKMB, CKMBINDEX, TROPONINI in the last 168 hours.  BNP (last 3 results) No results for input(s): PROBNP in the last 8760 hours.  HbA1C: No results for input(s): HGBA1C in the last 72 hours.  CBG: No results for input(s): GLUCAP in the last 168 hours.  Lipid Profile: No results for input(s): CHOL, HDL, LDLCALC,  TRIG, CHOLHDL, LDLDIRECT in the last 72 hours.  Thyroid Function Tests: No results for input(s): TSH, T4TOTAL, FREET4, T3FREE, THYROIDAB in the last 72 hours.  Anemia Panel: No results for input(s): VITAMINB12, FOLATE, FERRITIN, TIBC, IRON, RETICCTPCT in the last 72 hours.  Urine analysis:    Component Value Date/Time   COLORURINE YELLOW 09/22/2018 0404   APPEARANCEUR CLEAR 09/22/2018 0404   LABSPEC 1.008 09/22/2018 0404   PHURINE 6.0 09/22/2018 0404   GLUCOSEU NEGATIVE 09/22/2018 0404   HGBUR SMALL (A) 09/22/2018 0404   BILIRUBINUR NEGATIVE 09/22/2018 0404   KETONESUR 5 (A) 09/22/2018 0404   PROTEINUR 30 (A) 09/22/2018 0404   NITRITE NEGATIVE 09/22/2018 0404   LEUKOCYTESUR NEGATIVE 09/22/2018 0404    Sepsis Labs: Lactic Acid, Venous    Component Value Date/Time   LATICACIDVEN 1.79 09/21/2018 2056    MICROBIOLOGY: Recent Results (from the past 240 hour(s))  Blood Culture (routine x 2)     Status: None (Preliminary result)   Collection Time: 09/21/18  8:41 PM  Result Value Ref Range Status   Specimen Description BLOOD RIGHT ANTECUBITAL  Final   Special Requests   Final    BOTTLES DRAWN AEROBIC AND ANAEROBIC Blood Culture adequate volume   Culture   Final    NO GROWTH 4 DAYS Performed at [facility], [place] [address] [place], [place] [address]    Report Status PENDING  Incomplete   Blood Culture (routine x 2)     Status: None (Preliminary result)   Collection Time: 09/21/18  9:34 PM  Result Value Ref Range Status   Specimen Description BLOOD LEFT ARM  Final   Special Requests   Final    BOTTLES DRAWN AEROBIC ONLY Blood Culture results may not be optimal due to an excessive volume of blood received in culture bottles   Culture   Final    NO GROWTH 4 DAYS Performed at [facility], [place] [address], [place], [place] [address]    Report Status PENDING  Incomplete  Respiratory Panel by PCR     Status: None   Collection Time: 09/24/18 12:05 PM  Result Value Ref Range Status   Adenovirus NOT DETECTED NOT DETECTED Final   Coronavirus 229E NOT DETECTED NOT DETECTED Final   Coronavirus HKU1 NOT DETECTED NOT DETECTED Final   Coronavirus NL63 NOT DETECTED NOT DETECTED Final   Coronavirus OC43 NOT DETECTED NOT DETECTED Final   Metapneumovirus NOT DETECTED NOT DETECTED Final   Rhinovirus / Enterovirus NOT DETECTED NOT DETECTED Final   Influenza A NOT DETECTED NOT DETECTED Final   Influenza B NOT DETECTED NOT DETECTED Final   Parainfluenza Virus 1 NOT DETECTED NOT DETECTED Final   Parainfluenza Virus 2 NOT DETECTED NOT DETECTED Final   Parainfluenza Virus 3 NOT DETECTED NOT DETECTED Final   Parainfluenza Virus 4 NOT DETECTED NOT DETECTED Final   Respiratory Syncytial Virus NOT DETECTED NOT DETECTED Final   Bordetella pertussis NOT DETECTED NOT DETECTED Final   Chlamydophila pneumoniae NOT DETECTED NOT DETECTED Final   Mycoplasma pneumoniae NOT DETECTED NOT DETECTED Final    Comment: Performed at [facility], [place] [address], [place], [place] [address]    RADIOLOGY STUDIES/RESULTS: Dg Chest 2 View  Result Date: 09/21/2018 CLINICAL DATA:  Dyspnea, cough and fever EXAM: CHEST - 2 VIEW COMPARISON:  03/04/2011 CXR and chest CT 04/23/2017 FINDINGS: Normal heart size and mediastinal contours. Confluent opacity in the right upper lobe posterior segment suspicious for  pneumonia with more  streaky parenchymal opacities likely representing atelectasis in the upper lobes, lingula and left base. No effusion or pneumothorax. Osteopenia without acute osseous abnormality. IMPRESSION: Dominant finding is that of right upper lobe airspace disease consistent with right upper lobe pneumonia. Followup PA and lateral chest X-ray is recommended in 3-4 weeks following trial of antibiotic therapy to ensure resolution and exclude underlying malignancy. Electronically Signed   By: Ashley Royalty M.D.   On: 09/21/2018 21:02   Dg Chest Port 1 View  Result Date: 09/25/2018 CLINICAL DATA:  Shortness of breath.  Cough.  Pneumonia. EXAM: PORTABLE CHEST 1 VIEW COMPARISON:  09/21/2018. FINDINGS: Progressive right upper lobe infiltrate noted. Left upper lobe and right base infiltrates are also noted. Bibasilar atelectasis. Small left pleural effusion. Heart size normal. Surgical clips noted the left chest. Degenerative changes scoliosis thoracic spine. IMPRESSION: Progressive right upper lobe infiltrate. Left upper lobe and right base infiltrates are also noted. These findings are most consistent multifocal pneumonia. Small left pleural effusion. Electronically Signed   By: Marcello Moores  Register   On: 09/25/2018 06:37     LOS: 4 days   Oren Binet, MD  [facility]  If 7PM-7AM, please contact night-coverage  Please page via [email or web address] [email or web address]-click on MD name and type text message  09/25/2018, 10:30 AM

## 2018-09-25 NOTE — Progress Notes (Signed)
  Echocardiogram 2D Echocardiogram has been performed.  Megan Porter 09/25/2018, 10:14 AM

## 2018-09-26 LAB — CBC
HCT: 32.4 % — ABNORMAL LOW (ref 36.0–46.0)
HEMOGLOBIN: 10.6 g/dL — AB (ref 12.0–15.0)
MCH: 32.9 pg (ref 26.0–34.0)
MCHC: 32.7 g/dL (ref 30.0–36.0)
MCV: 100.6 fL — ABNORMAL HIGH (ref 80.0–100.0)
Platelets: 285 10*3/uL (ref 150–400)
RBC: 3.22 MIL/uL — ABNORMAL LOW (ref 3.87–5.11)
RDW: 12.9 % (ref 11.5–15.5)
WBC: 14.6 10*3/uL — ABNORMAL HIGH (ref 4.0–10.5)
nRBC: 0 % (ref 0.0–0.2)

## 2018-09-26 LAB — CULTURE, BLOOD (ROUTINE X 2)
Culture: NO GROWTH
Culture: NO GROWTH
Special Requests: ADEQUATE

## 2018-09-26 LAB — BASIC METABOLIC PANEL
Anion gap: 9 (ref 5–15)
BUN: 10 mg/dL (ref 8–23)
CO2: 29 mmol/L (ref 22–32)
Calcium: 7.8 mg/dL — ABNORMAL LOW (ref 8.9–10.3)
Chloride: 101 mmol/L (ref 98–111)
Creatinine, Ser: 0.7 mg/dL (ref 0.44–1.00)
GFR calc Af Amer: >60 mL/min (ref >60)
GFR calc non Af Amer: >60 mL/min (ref >60)
Glucose, Bld: 99 mg/dL (ref 70–99)
Potassium: 3.8 mmol/L (ref 3.5–5.1)
SODIUM: 139 mmol/L (ref 135–145)

## 2018-09-26 MED ORDER — PREDNISONE 10 MG PO TABS
10.0000 mg | ORAL_TABLET | Freq: Every day | ORAL | Status: DC
  Administered 2018-09-27: 10 mg via ORAL
  Filled 2018-09-26: qty 1

## 2018-09-26 NOTE — Progress Notes (Signed)
PROGRESS NOTE        PATIENT DETAILS Name: Megan Porter Age: 78 y.o. Sex: female Date of Birth: 1940/12/20 Admit Date: 09/21/2018 Admitting Physician Elwyn Reach, MD WYO:[id number], Fransico Him, MD  Brief Narrative: Patient is a 78 y.o. female with history of RA (on methotrexate, Plaquenil), hypothyroidism breast cancer acute hypoxic respiratory failure in the setting of pneumonia.  See below for further details  Subjective: Less cough-no shortness of breath-overall better-ambulating in the room in the hallway  Assessment/Plan: Acute hypoxic respiratory failure secondary to pneumonia: Clinical improvement continues-shortness of breath has essentially resolved-she was ambulating around the room this morning, this MD saw patient ambulating in the hallway yesterday without any discomfort.  Continues to have some cough.  Continue cefepime-stop Zithromax.  If clinical improvement continues-we can probably transition to oral antimicrobial therapy tomorrow and discharge home.  Echocardiogram showed only mild diastolic dysfunction-no signs of volume overload.  Will require repeat 2 view chest x-ray in 3 to 4 weeks as an outpatient.  History of seronegative rheumatoid arthritis: Stable-continue to hold methotrexate and Plaquenil.    Hypothyroidism: Continue Synthroid  Hypertension: BP relatively controlled-continue to monitor off antihypertensives  Breast cancer: Follow with oncology as outpatient  DVT Prophylaxis: Prophylactic Lovenox   Code Status: Full code   Family Communication: Daughter on the floor  Disposition Plan: Remain inpatient-hopefully home on 1/11 if clinical improvement continues  Antimicrobial agents: Anti-infectives (From admission, onward)   Start     Dose/Rate Route Frequency Ordered Stop   09/24/18 1200  ceFEPIme (MAXIPIME) 2 g in sodium chloride 0.9 % 100 mL IVPB     2 g 200 mL/hr over 30 Minutes Intravenous Every 24 hours 09/24/18  1126     09/23/18 2200  azithromycin (ZITHROMAX) tablet 500 mg  Status:  Discontinued     500 mg Oral Daily at bedtime 09/23/18 0947 09/26/18 1109   09/22/18 2030  azithromycin (ZITHROMAX) 500 mg in sodium chloride 0.9 % 250 mL IVPB  Status:  Discontinued     500 mg 250 mL/hr over 60 Minutes Intravenous Every 24 hours 09/21/18 2309 09/23/18 0947   09/22/18 2000  cefTRIAXone (ROCEPHIN) 1 g in sodium chloride 0.9 % 100 mL IVPB  Status:  Discontinued     1 g 200 mL/hr over 30 Minutes Intravenous Every 24 hours 09/21/18 2309 09/24/18 1126   09/22/18 1000  hydroxychloroquine (PLAQUENIL) tablet 200 mg  Status:  Discontinued     200 mg Oral Daily 09/21/18 2308 09/24/18 1418   09/21/18 2115  cefTRIAXone (ROCEPHIN) 2 g in sodium chloride 0.9 % 100 mL IVPB  Status:  Discontinued     2 g 200 mL/hr over 30 Minutes Intravenous Every 24 hours 09/21/18 2107 09/21/18 2330   09/21/18 2115  azithromycin (ZITHROMAX) 500 mg in sodium chloride 0.9 % 250 mL IVPB  Status:  Discontinued     500 mg 250 mL/hr over 60 Minutes Intravenous Every 24 hours 09/21/18 2107 09/21/18 2330      Procedures: None  CONSULTS:  None  Time spent: 25- minutes-Greater than 50% of this time was spent in counseling, explanation of diagnosis, planning of further management, and coordination of care.  MEDICATIONS: Scheduled Meds: . calcium citrate  200 mg of elemental calcium Oral Daily  . cholecalciferol  1,000 Units Oral Daily  . citalopram  40 mg Oral Daily  .  enoxaparin (LOVENOX) injection  40 mg Subcutaneous Q24H  . folic acid  1 mg Oral Daily  . guaiFENesin  1,200 mg Oral BID  . levothyroxine  50 mcg Oral QAC breakfast  . mouth rinse  15 mL Mouth Rinse BID  . multivitamin with minerals  1 tablet Oral Daily  . predniSONE  20 mg Oral Q breakfast  . raloxifene  60 mg Oral Daily  . traZODone  50 mg Oral QHS   Continuous Infusions: . sodium chloride    . ceFEPime (MAXIPIME) IV Stopped (09/25/18 1232)   PRN  Meds:.acetaminophen, albuterol, docusate sodium   PHYSICAL EXAM: Vital signs: Vitals:   09/25/18 0800 09/25/18 1408 09/25/18 2134 09/26/18 0453  BP:  (!) 129/59 (!) 142/63 (!) 144/65  Pulse:  92 88 88  Resp:  18 19 19   Temp:  98.5 F (36.9 C) 98.4 F (36.9 C) 98.9 F (37.2 C)  TempSrc:   Oral Oral  SpO2: 94% 95% 95% 95%  Weight:      Height:       Filed Weights   09/21/18 2316  Weight: 58.1 kg   Body mass index is 22.69 kg/m.   General appearance:Awake, alert, not in any distress.  Eyes:no scleral icterus. HEENT: Atraumatic and Normocephalic Neck: supple, no JVD. Resp:Good air entry bilaterally-few rales-but markedly better than the past few days CVS: S1 S2 regular, no murmurs.  GI: Bowel sounds present, Non tender and not distended with no gaurding, rigidity or rebound. Extremities: B/L Lower Ext shows no edema, both legs are warm to touch Neurology:  Non focal Psychiatric: Normal judgment and insight. Normal mood. Musculoskeletal:No digital cyanosis Skin:No Rash, warm and dry Wounds:N/A  I have personally reviewed following labs and imaging studies  LABORATORY DATA: CBC: Recent Labs  Lab 09/21/18 2031 09/23/18 0532 09/24/18 0656 09/25/18 0433 09/26/18 0619  WBC 16.6* 17.2* 16.5* 12.5* 14.6*  NEUTROABS 14.5*  --   --   --   --   HGB 12.1 9.8* 10.8* 10.0* 10.6*  HCT 37.1 30.2* 33.1* 31.2* 32.4*  MCV 102.8* 104.1* 104.1* 102.0* 100.6*  PLT 212 207 297 248 662    Basic Metabolic Panel: Recent Labs  Lab 09/22/18 0440 09/23/18 0532 09/24/18 0656 09/25/18 0433 09/26/18 0619  NA 144 144 143 142 139  K 3.3* 4.0 3.8 3.1* 3.8  CL 112* 108 108 104 101  CO2 21* 27 28 30 29   GLUCOSE 158* 146* 106* 100* 99  BUN 8 11 14  7* 10  CREATININE 0.71 0.75 0.74 0.52 0.70  CALCIUM 8.0* 8.2* 8.2* 7.6* 7.8*    GFR: Estimated Creatinine Clearance: 47.2 mL/min (by C-G formula based on SCr of 0.7 mg/dL).  Liver Function Tests: Recent Labs  Lab 09/21/18 2031  09/22/18 0440 09/25/18 0433  AST 41 33 18  ALT 59* 48* 30  ALKPHOS 127* 106 70  BILITOT 1.1 0.5 0.6  PROT 7.8 5.7* 5.1*  ALBUMIN 3.0* 2.1* 1.8*   No results for input(s): LIPASE, AMYLASE in the last 168 hours. No results for input(s): AMMONIA in the last 168 hours.  Coagulation Profile: No results for input(s): INR, PROTIME in the last 168 hours.  Cardiac Enzymes: No results for input(s): CKTOTAL, CKMB, CKMBINDEX, TROPONINI in the last 168 hours.  BNP (last 3 results) No results for input(s): PROBNP in the last 8760 hours.  HbA1C: No results for input(s): HGBA1C in the last 72 hours.  CBG: No results for input(s): GLUCAP in the last 168 hours.  Lipid  Profile: No results for input(s): CHOL, HDL, LDLCALC, TRIG, CHOLHDL, LDLDIRECT in the last 72 hours.  Thyroid Function Tests: No results for input(s): TSH, T4TOTAL, FREET4, T3FREE, THYROIDAB in the last 72 hours.  Anemia Panel: No results for input(s): VITAMINB12, FOLATE, FERRITIN, TIBC, IRON, RETICCTPCT in the last 72 hours.  Urine analysis:    Component Value Date/Time   COLORURINE YELLOW 09/22/2018 0404   APPEARANCEUR CLEAR 09/22/2018 0404   LABSPEC 1.008 09/22/2018 0404   PHURINE 6.0 09/22/2018 0404   GLUCOSEU NEGATIVE 09/22/2018 0404   HGBUR SMALL (A) 09/22/2018 0404   BILIRUBINUR NEGATIVE 09/22/2018 0404   KETONESUR 5 (A) 09/22/2018 0404   PROTEINUR 30 (A) 09/22/2018 0404   NITRITE NEGATIVE 09/22/2018 0404   LEUKOCYTESUR NEGATIVE 09/22/2018 0404    Sepsis Labs: Lactic Acid, Venous    Component Value Date/Time   LATICACIDVEN 1.79 09/21/2018 2056    MICROBIOLOGY: Recent Results (from the past 240 hour(s))  Blood Culture (routine x 2)     Status: None   Collection Time: 09/21/18  8:41 PM  Result Value Ref Range Status   Specimen Description BLOOD RIGHT ANTECUBITAL  Final   Special Requests   Final    BOTTLES DRAWN AEROBIC AND ANAEROBIC Blood Culture adequate volume   Culture   Final    NO GROWTH 5  DAYS Performed at [facility], [place] [address] [place], [place] [address]    Report Status 09/26/2018 FINAL  Final  Blood Culture (routine x 2)     Status: None   Collection Time: 09/21/18  9:34 PM  Result Value Ref Range Status   Specimen Description BLOOD LEFT ARM  Final   Special Requests   Final    BOTTLES DRAWN AEROBIC ONLY Blood Culture results may not be optimal due to an excessive volume of blood received in culture bottles   Culture   Final    NO GROWTH 5 DAYS Performed at [facility], [place] [address], [place], [place] [address]    Report Status 09/26/2018 FINAL  Final  Respiratory Panel by PCR     Status: None   Collection Time: 09/24/18 12:05 PM  Result Value Ref Range Status   Adenovirus NOT DETECTED NOT DETECTED Final   Coronavirus 229E NOT DETECTED NOT DETECTED Final   Coronavirus HKU1 NOT DETECTED NOT DETECTED Final   Coronavirus NL63 NOT DETECTED NOT DETECTED Final   Coronavirus OC43 NOT DETECTED NOT DETECTED Final   Metapneumovirus NOT DETECTED NOT DETECTED Final   Rhinovirus / Enterovirus NOT DETECTED NOT DETECTED Final   Influenza A NOT DETECTED NOT DETECTED Final   Influenza B NOT DETECTED NOT DETECTED Final   Parainfluenza Virus 1 NOT DETECTED NOT DETECTED Final   Parainfluenza Virus 2 NOT DETECTED NOT DETECTED Final   Parainfluenza Virus 3 NOT DETECTED NOT DETECTED Final   Parainfluenza Virus 4 NOT DETECTED NOT DETECTED Final   Respiratory Syncytial Virus NOT DETECTED NOT DETECTED Final   Bordetella pertussis NOT DETECTED NOT DETECTED Final   Chlamydophila pneumoniae NOT DETECTED NOT DETECTED Final   Mycoplasma pneumoniae NOT DETECTED NOT DETECTED Final    Comment: Performed at [facility], [place] [address], [place], [place] [address]    RADIOLOGY STUDIES/RESULTS: Dg Chest 2 View  Result Date: 09/21/2018 CLINICAL DATA:  Dyspnea, cough and fever EXAM: CHEST - 2 VIEW COMPARISON:  03/04/2011 CXR and chest CT 04/23/2017 FINDINGS: Normal  heart size and mediastinal contours. Confluent opacity in the right upper lobe posterior  segment suspicious for pneumonia with more streaky parenchymal opacities likely representing atelectasis in the upper lobes, lingula and left base. No effusion or pneumothorax. Osteopenia without acute osseous abnormality. IMPRESSION: Dominant finding is that of right upper lobe airspace disease consistent with right upper lobe pneumonia. Followup PA and lateral chest X-ray is recommended in 3-4 weeks following trial of antibiotic therapy to ensure resolution and exclude underlying malignancy. Electronically Signed   By: Ashley Royalty M.D.   On: 09/21/2018 21:02   Dg Chest Port 1 View  Result Date: 09/25/2018 CLINICAL DATA:  Shortness of breath.  Cough.  Pneumonia. EXAM: PORTABLE CHEST 1 VIEW COMPARISON:  09/21/2018. FINDINGS: Progressive right upper lobe infiltrate noted. Left upper lobe and right base infiltrates are also noted. Bibasilar atelectasis. Small left pleural effusion. Heart size normal. Surgical clips noted the left chest. Degenerative changes scoliosis thoracic spine. IMPRESSION: Progressive right upper lobe infiltrate. Left upper lobe and right base infiltrates are also noted. These findings are most consistent multifocal pneumonia. Small left pleural effusion. Electronically Signed   By: Marcello Moores  Register   On: 09/25/2018 06:37     LOS: 5 days   Oren Binet, MD  [facility]  If 7PM-7AM, please contact night-coverage  Please page via [email or web address]-click on MD name and type text message  09/26/2018, 12:13 PM

## 2018-09-27 DIAGNOSIS — M0609 Rheumatoid arthritis without rheumatoid factor, multiple sites: Secondary | ICD-10-CM

## 2018-09-27 MED ORDER — GUAIFENESIN ER 600 MG PO TB12: 600.0000 mg | ORAL_TABLET | Freq: Two times a day (BID) | ORAL | 0 refills | Status: DC

## 2018-09-27 MED ORDER — ALBUTEROL SULFATE HFA 108 (90 BASE) MCG/ACT IN AERS: 2.0000 | INHALATION_SPRAY | Freq: Four times a day (QID) | RESPIRATORY_TRACT | 0 refills | Status: DC | PRN

## 2018-09-27 MED ORDER — AMOXICILLIN-POT CLAVULANATE 875-125 MG PO TABS: 1.0000 | ORAL_TABLET | Freq: Two times a day (BID) | ORAL | 0 refills | Status: DC

## 2018-09-27 MED ORDER — METHOTREXATE 2.5 MG PO TABS: 10.0000 mg | ORAL_TABLET | ORAL | Status: DC

## 2018-09-27 NOTE — Discharge Summary (Signed)
PATIENT DETAILS Name: Megan Porter Age: 78 y.o. Sex: female Date of Birth: 09-14-1941 MRN: [id number]. Admitting Physician: Elwyn Reach, MD CHE:NIDPOEU, Fransico Him, MD  Admit Date: 09/21/2018 Discharge date: 09/27/2018  Recommendations for Outpatient Follow-up:  1. Follow up with PCP in 1-2 weeks 2. Please obtain BMP/CBC in one week 3. Repeat 2 view Chest Xray in 4-6 weeks to document resolution of PNA  Admitted From:  Home  Disposition: [place]: No  Equipment/Devices: None  Discharge Condition: Stable  CODE STATUS: FULL CODE  Diet recommendation:  Heart Healthy   Brief Summary: See H&P, Labs, Consult and Test reports for all details in brief,Patient is a 78 y.o. female with history of RA (on methotrexate, Plaquenil), hypothyroidism breast cancer acute hypoxic respiratory failure in the setting of pneumonia.  See below for further details  Brief Hospital Course: Acute hypoxic respiratory failure secondary to pneumonia: Significantly improved, tapered off O2 this morning, and on room air. No fever, mild leukocytosis persists, has ambulated around the hallway without any issues. Earlier this week, patient was short of breath with minimal exertion. Respiratory virus panel and blood cultures negative so far.  BNP slightly elevated-no obvious signs of volume overload-TTE does show some diastolic dysfunction. Initially on Rocephin/Zithrom-but broadened to Cefepime-due to lack of clinical improvement. Has rapidly improved with Cefepime on board since 1/8-will transition to Augmentin on discharge.   History of seronegative rheumatoid arthritis: Stable-continue to hold methotrexate for 1 additional week,will resume Plaquenil on discharge  Hypothyroidism: Continue Synthroid  Hypertension: BP controlled-continue to monitor off antihypertensives.   Breast cancer: Follow with oncology as outpatient  Procedures/Studies: None  Discharge Diagnoses:  Principal  Problem:   Pneumonia Active Problems:   Rheumatoid arthritis, seronegative, multiple sites ([place])   History of breast cancer   Hypokalemia   Benign essential HTN   Leucocytosis   Discharge Instructions:  Activity:  As tolerated   Discharge Instructions    Call MD for:  difficulty breathing, headache or visual disturbances   Complete by:  As directed    Call MD for:  temperature >100.4   Complete by:  As directed    Diet - low sodium heart healthy   Complete by:  As directed    Discharge instructions   Complete by:  As directed    Follow with Primary MD  Tisovec, Fransico Him, MD in 1 week   You had Pneumonia: Please ask your primary care practitioner to get a 2 view Chest X ray done in 4-6 weeks after hospital discharge   Please get a complete blood count and chemistry panel checked by your Primary MD at your next visit, and again as instructed by your Primary MD.  Get Medicines reviewed and adjusted: Please take all your medications with you for your next visit with your Primary MD  Laboratory/radiological data: Please request your Primary MD to go over all hospital tests and procedure/radiological results at the follow up, please ask your Primary MD to get all Hospital records sent to his/her office.  In some cases, they will be blood work, cultures and biopsy results pending at the time of your discharge. Please request that your primary care M.D. follows up on these results.  Also Note the following: If you experience worsening of your admission symptoms, develop shortness of breath, life threatening emergency, suicidal or homicidal thoughts you must seek medical attention immediately by calling 911 or calling your MD immediately  if symptoms less severe.  You must  read complete instructions/literature along with all the possible adverse reactions/side effects for all the Medicines you take and that have been prescribed to you. Take any new Medicines after you have  completely understood and accpet all the possible adverse reactions/side effects.   Do not drive when taking Pain medications or sleeping medications (Benzodaizepines)  Do not take more than prescribed Pain, Sleep and Anxiety Medications. It is not advisable to combine anxiety,sleep and pain medications without talking with your primary care practitioner  Special Instructions: If you have smoked or chewed Tobacco  in the last 2 yrs please stop smoking, stop any regular Alcohol  and or any Recreational drug use.  Wear Seat belts while driving.  Please note: You were cared for by a hospitalist during your hospital stay. Once you are discharged, your primary care physician will handle any further medical issues. Please note that NO REFILLS for any discharge medications will be authorized once you are discharged, as it is imperative that you return to your primary care physician (or establish a relationship with a primary care physician if you do not have one) for your post hospital discharge needs so that they can reassess your need for medications and monitor your lab values.   Increase activity slowly   Complete by:  As directed      Allergies as of 09/27/2018   No Known Allergies     Medication List    STOP taking these medications   oseltamivir 75 MG capsule Commonly known as:  TAMIFLU     TAKE these medications   albuterol 108 (90 Base) MCG/ACT inhaler Commonly known as:  PROVENTIL HFA;VENTOLIN HFA Inhale 2 puffs into the lungs every 6 (six) hours as needed for wheezing or shortness of breath.   amoxicillin-clavulanate 875-125 MG tablet Commonly known as:  AUGMENTIN Take 1 tablet by mouth 2 (two) times daily.   calcium citrate 950 MG tablet Commonly known as:  CALCITRATE - dosed in mg elemental calcium Take 200 mg of elemental calcium by mouth daily.   cholecalciferol 1000 units tablet Commonly known as:  VITAMIN D Take 1,000 Units by mouth daily.   citalopram 40 MG  tablet Commonly known as:  CELEXA Take 40 mg by mouth daily.   dextromethorphan 30 MG/5ML liquid Commonly known as:  DELSYM Take 30 mg by mouth daily as needed for cough.   folic acid 1 MG tablet Commonly known as:  FOLVITE TAKE 2 TABLETS EVERY DAY   guaiFENesin 600 MG 12 hr tablet Commonly known as:  MUCINEX Take 1 tablet (600 mg total) by mouth 2 (two) times daily.   hydroxychloroquine 200 MG tablet Commonly known as:  PLAQUENIL TAKE 1 TABLET (200 MG TOTAL) BY MOUTH DAILY.   levothyroxine 50 MCG tablet Commonly known as:  SYNTHROID, LEVOTHROID Take 50 mcg by mouth daily before breakfast.   methotrexate 2.5 MG tablet Commonly known as:  RHEUMATREX Take 4 tablets (10 mg total) by mouth every Tuesday. Start taking on:  October 06, 2018 What changed:    how much to take  how to take this  when to take this  additional instructions  These instructions start on October 06, 2018. If you are unsure what to do until then, ask your doctor or other care provider.   multivitamin capsule Take 1 capsule by mouth daily.   raloxifene 60 MG tablet Commonly known as:  EVISTA Take 60 mg by mouth daily.   traZODone 50 MG tablet Commonly known as:  DESYREL Take  25 mg by mouth at bedtime.      Follow-up Information    Tisovec, Fransico Him, MD. Schedule an appointment as soon as possible for a visit in 1 week(s).   Specialty:  Internal Medicine Contact information: [address] [place] [address] [phone]          No Known Allergies  Consultations:   None   Other Procedures/Studies: Dg Chest 2 View  Result Date: 09/21/2018 CLINICAL DATA:  Dyspnea, cough and fever EXAM: CHEST - 2 VIEW COMPARISON:  03/04/2011 CXR and chest CT 04/23/2017 FINDINGS: Normal heart size and mediastinal contours. Confluent opacity in the right upper lobe posterior segment suspicious for pneumonia with more streaky parenchymal opacities likely representing atelectasis in the upper  lobes, lingula and left base. No effusion or pneumothorax. Osteopenia without acute osseous abnormality. IMPRESSION: Dominant finding is that of right upper lobe airspace disease consistent with right upper lobe pneumonia. Followup PA and lateral chest X-ray is recommended in 3-4 weeks following trial of antibiotic therapy to ensure resolution and exclude underlying malignancy. Electronically Signed   By: Ashley Royalty M.D.   On: 09/21/2018 21:02   Dg Chest Port 1 View  Result Date: 09/25/2018 CLINICAL DATA:  Shortness of breath.  Cough.  Pneumonia. EXAM: PORTABLE CHEST 1 VIEW COMPARISON:  09/21/2018. FINDINGS: Progressive right upper lobe infiltrate noted. Left upper lobe and right base infiltrates are also noted. Bibasilar atelectasis. Small left pleural effusion. Heart size normal. Surgical clips noted the left chest. Degenerative changes scoliosis thoracic spine. IMPRESSION: Progressive right upper lobe infiltrate. Left upper lobe and right base infiltrates are also noted. These findings are most consistent multifocal pneumonia. Small left pleural effusion. Electronically Signed   By: Marcello Moores  Register   On: 09/25/2018 06:37      TODAY-DAY OF DISCHARGE:  Subjective:   Megan Porter today has no headache,no chest abdominal pain,no new weakness tingling or numbness, feels much better wants to go home today.   Objective:   Blood pressure 129/69, pulse 82, temperature 99.1 F (37.3 C), temperature source Oral, resp. rate 18, height 5\' 3"  (1.6 m), weight 58.1 kg, SpO2 93 %. No intake or output data in the 24 hours ending 09/27/18 0829 Filed Weights   09/21/18 2316  Weight: 58.1 kg    Exam: Awake Alert, Oriented *3, No new F.N deficits, Normal affect .AT,PERRAL Supple Neck,No JVD, No cervical lymphadenopathy appriciated.  Symmetrical Chest wall movement, Good air movement bilaterally, CTAB RRR,No Gallops,Rubs or new Murmurs, No Parasternal Heave +ve B.Sounds, Abd Soft, Non tender, No  organomegaly appriciated, No rebound -guarding or rigidity. No Cyanosis, Clubbing or edema, No new Rash or bruise   PERTINENT RADIOLOGIC STUDIES: Dg Chest 2 View  Result Date: 09/21/2018 CLINICAL DATA:  Dyspnea, cough and fever EXAM: CHEST - 2 VIEW COMPARISON:  03/04/2011 CXR and chest CT 04/23/2017 FINDINGS: Normal heart size and mediastinal contours. Confluent opacity in the right upper lobe posterior segment suspicious for pneumonia with more streaky parenchymal opacities likely representing atelectasis in the upper lobes, lingula and left base. No effusion or pneumothorax. Osteopenia without acute osseous abnormality. IMPRESSION: Dominant finding is that of right upper lobe airspace disease consistent with right upper lobe pneumonia. Followup PA and lateral chest X-ray is recommended in 3-4 weeks following trial of antibiotic therapy to ensure resolution and exclude underlying malignancy. Electronically Signed   By: Ashley Royalty M.D.   On: 09/21/2018 21:02   Dg Chest Port 1 View  Result Date: 09/25/2018 CLINICAL DATA:  Shortness of breath.  Cough.  Pneumonia. EXAM: PORTABLE CHEST 1 VIEW COMPARISON:  09/21/2018. FINDINGS: Progressive right upper lobe infiltrate noted. Left upper lobe and right base infiltrates are also noted. Bibasilar atelectasis. Small left pleural effusion. Heart size normal. Surgical clips noted the left chest. Degenerative changes scoliosis thoracic spine. IMPRESSION: Progressive right upper lobe infiltrate. Left upper lobe and right base infiltrates are also noted. These findings are most consistent multifocal pneumonia. Small left pleural effusion. Electronically Signed   By: Marcello Moores  Register   On: 09/25/2018 06:37     PERTINENT LAB RESULTS: CBC: Recent Labs    09/25/18 0433 09/26/18 0619  WBC 12.5* 14.6*  HGB 10.0* 10.6*  HCT 31.2* 32.4*  PLT 248 285   CMET CMP     Component Value Date/Time   NA 139 09/26/2018 0619   K 3.8 09/26/2018 0619   CL 101 09/26/2018  0619   CO2 29 09/26/2018 0619   GLUCOSE 99 09/26/2018 0619   BUN 10 09/26/2018 0619   CREATININE 0.70 09/26/2018 0619   CREATININE 0.76 08/01/2018 1211   CALCIUM 7.8 (L) 09/26/2018 0619   PROT 5.1 (L) 09/25/2018 0433   ALBUMIN 1.8 (L) 09/25/2018 0433   AST 18 09/25/2018 0433   ALT 30 09/25/2018 0433   ALKPHOS 70 09/25/2018 0433   BILITOT 0.6 09/25/2018 0433   GFRNONAA >60 09/26/2018 0619   GFRNONAA 75 08/01/2018 1211   GFRAA >60 09/26/2018 0619   GFRAA 86 08/01/2018 1211    GFR Estimated Creatinine Clearance: 47.2 mL/min (by C-G formula based on SCr of 0.7 mg/dL). No results for input(s): LIPASE, AMYLASE in the last 72 hours. No results for input(s): CKTOTAL, CKMB, CKMBINDEX, TROPONINI in the last 72 hours. Invalid input(s): POCBNP No results for input(s): DDIMER in the last 72 hours. No results for input(s): HGBA1C in the last 72 hours. No results for input(s): CHOL, HDL, LDLCALC, TRIG, CHOLHDL, LDLDIRECT in the last 72 hours. No results for input(s): TSH, T4TOTAL, T3FREE, THYROIDAB in the last 72 hours.  Invalid input(s): FREET3 No results for input(s): VITAMINB12, FOLATE, FERRITIN, TIBC, IRON, RETICCTPCT in the last 72 hours. Coags: No results for input(s): INR in the last 72 hours.  Invalid input(s): PT Microbiology: Recent Results (from the past 240 hour(s))  Blood Culture (routine x 2)     Status: None   Collection Time: 09/21/18  8:41 PM  Result Value Ref Range Status   Specimen Description BLOOD RIGHT ANTECUBITAL  Final   Special Requests   Final    BOTTLES DRAWN AEROBIC AND ANAEROBIC Blood Culture adequate volume   Culture   Final    NO GROWTH 5 DAYS Performed at [facility], [address], [place], [place] [address]    Report Status 09/26/2018 FINAL  Final  Blood Culture (routine x 2)     Status: None   Collection Time: 09/21/18  9:34 PM  Result Value Ref Range Status   Specimen Description BLOOD LEFT ARM  Final   Special Requests   Final     BOTTLES DRAWN AEROBIC ONLY Blood Culture results may not be optimal due to an excessive volume of blood received in culture bottles   Culture   Final    NO GROWTH 5 DAYS Performed at [facility], [place] [address] [place], [place] [address]    Report Status 09/26/2018 FINAL  Final  Respiratory Panel by PCR     Status: None   Collection Time: 09/24/18 12:05 PM  Result Value Ref Range Status   Adenovirus NOT DETECTED NOT DETECTED Final   Coronavirus 229E NOT DETECTED NOT DETECTED Final   Coronavirus HKU1 NOT DETECTED NOT DETECTED Final   Coronavirus NL63 NOT DETECTED NOT DETECTED Final   Coronavirus OC43 NOT DETECTED NOT DETECTED Final   Metapneumovirus NOT DETECTED NOT DETECTED Final   Rhinovirus / Enterovirus NOT DETECTED NOT DETECTED Final   Influenza A NOT DETECTED NOT DETECTED Final   Influenza B NOT DETECTED NOT DETECTED Final   Parainfluenza Virus 1 NOT DETECTED NOT DETECTED Final   Parainfluenza Virus 2 NOT DETECTED NOT DETECTED Final   Parainfluenza Virus 3 NOT DETECTED NOT DETECTED Final   Parainfluenza Virus 4 NOT DETECTED NOT DETECTED Final   Respiratory Syncytial Virus NOT DETECTED NOT DETECTED Final   Bordetella pertussis NOT DETECTED NOT DETECTED Final   Chlamydophila pneumoniae NOT DETECTED NOT DETECTED Final   Mycoplasma pneumoniae NOT DETECTED NOT DETECTED Final    Comment: Performed at [facility], [place] [address], [place], [place] [address]    FURTHER DISCHARGE INSTRUCTIONS:  Get Medicines reviewed and adjusted: Please take all your medications with you for your next visit with your Primary MD  Laboratory/radiological data: Please request your Primary MD to go over all hospital tests and procedure/radiological results at the follow up, please ask your Primary MD to get all Hospital records sent to his/her office.  In some cases, they will be blood work, cultures and biopsy results pending at the time of your discharge. Please request that your primary  care M.D. goes through all the records of your hospital data and follows up on these results.  Also Note the following: If you experience worsening of your admission symptoms, develop shortness of breath, life threatening emergency, suicidal or homicidal thoughts you must seek medical attention immediately by calling 911 or calling your MD immediately  if symptoms less severe.  You must read complete instructions/literature along with all the possible adverse reactions/side effects for all the Medicines you take and that have been prescribed to you. Take any new Medicines after you have completely understood and accpet all the possible adverse reactions/side effects.   Do not drive when taking Pain medications or sleeping medications (Benzodaizepines)  Do not take more than prescribed Pain, Sleep and Anxiety Medications. It is not advisable to combine anxiety,sleep and pain medications without talking with your primary care practitioner  Special Instructions: If you have smoked or chewed Tobacco  in the last 2 yrs please stop smoking, stop any regular Alcohol  and or any Recreational drug use.  Wear Seat belts while driving.  Please note: You were cared for by a hospitalist during your hospital stay. Once you are discharged, your primary care physician will handle any further medical issues. Please note that NO REFILLS for any discharge medications will be authorized once you are discharged, as it is imperative that you return to your primary care physician (or establish a relationship with a primary care physician if you do not have one) for your post hospital discharge needs so that they can reassess your need for medications and monitor your lab values.  Total Time spent coordinating discharge including counseling, education and face to face time equals 35 minutes.  SignedOren Binet 09/27/2018 8:29 AM

## 2018-09-27 NOTE — Progress Notes (Signed)
Patient and family given discharge instructions. IVs removed without complications. Family taking belongings to the car. Patient pushed downstairs in wheelchair by NT to main entrance. Patient driven home by daughter.

## 2018-10-08 ENCOUNTER — Telehealth: Payer: Self-pay | Admitting: Rheumatology

## 2018-10-08 NOTE — Telephone Encounter (Signed)
She can get clearance from PCP or pulmonologist to start on methotrexate after evaluation.

## 2018-10-08 NOTE — Telephone Encounter (Signed)
Patient advised she can get clearance from PCP or pulmonologist to start on methotrexate after evaluation.

## 2018-10-08 NOTE — Telephone Encounter (Signed)
Patient left a voicemail stating she was hospitalized for a week with pneumonia and while in the hospital the doctor took her off Methotrexate.  Patient states she is scheduled to see a pulmonologist on Monday.  Patient is not sure when she could start taking the Methotrexate.  Patient states she has numbness in left ankle and is wondering if this symptom is because she is not taking the Methotrexate.  Patient requested a return call.

## 2018-10-08 NOTE — Telephone Encounter (Signed)
Patient states she was hospitalized for a week with pneumonia. Patient states she had x-rays preformed on 10/02/18 and they stated she still has pneumonia in her chest. Patient states she finished her antibiotics on 10/03/18. Patient states she is due to see a pulmonologist on 10/13/18. Patient would like to know what to do about her MTX. Please advise.

## 2018-10-13 ENCOUNTER — Ambulatory Visit (INDEPENDENT_AMBULATORY_CARE_PROVIDER_SITE_OTHER)
Admission: RE | Admit: 2018-10-13 | Discharge: 2018-10-13 | Disposition: A | Discharge status: Home / Self Care | Payer: PRIVATE HEALTH INSURANCE | Source: Ambulatory Visit | Attending: Pulmonary Disease | Admitting: Pulmonary Disease

## 2018-10-13 ENCOUNTER — Ambulatory Visit: Payer: Medicare Other | Admitting: Pulmonary Disease

## 2018-10-13 ENCOUNTER — Encounter: Payer: Self-pay | Admitting: Pulmonary Disease

## 2018-10-13 VITALS — BP 122/60 | HR 77 | Ht 63.0 in | Wt 126.0 lb

## 2018-10-13 DIAGNOSIS — J189 Pneumonia, unspecified organism: Secondary | ICD-10-CM

## 2018-10-13 DIAGNOSIS — Z23 Encounter for immunization: Secondary | ICD-10-CM | POA: Diagnosis not present

## 2018-10-13 DIAGNOSIS — J479 Bronchiectasis, uncomplicated: Secondary | ICD-10-CM

## 2018-10-13 DIAGNOSIS — J181 Lobar pneumonia, unspecified organism: Secondary | ICD-10-CM

## 2018-10-13 NOTE — Progress Notes (Signed)
Megan Porter    [id number]    August 24, 1941  Primary Care Physician:Tisovec, Fransico Him, MD  Referring Physician: Haywood Pao, MD [address] [place], [place] [address]  Chief complaint:   Follow-up for bronchiectasis, pneumonia   HPI: Mrs. Megan Porter is a 78 year old with rheumatoid arthritis, cancer.She was hospitalized in May 2018 at [facility] for respiratory failure with multilobar infiltrates on CT scan. Flu test, Legionella urine test was negative.She was treated with a prednisone taper and Levaquin. She apparently had drug reaction with hives to Levaquin and it was changed to a different antibiotics. Since her return to [facility] she's had a follow-up CT scan earlier this month which showed resolution of the infiltrates.There is some mild bronchiectasis with mucus plugging in the right middle lobe. She has been referred to pulmonary for further evaluation.  She has history of rheumatoid arthritis and is followed by Dr. Estanislado Pandy. She is being maintained on methotrexate and Plaquenil. Her arthritis symptoms are under good control with no flare.  Pets:No pets. She used to have a dog. She does not have any birds, exotic pets or exposure to farm animals Occupation:Homemaker Exposures:No known exposures. No mold issues at home. Smoking history:Never smoker  Interim history: Hospitalized in early January for acute hypoxic respiratory failure secondary to pneumonia with chest x-ray showing right upper lobe infiltrate..  Work-up including urine pneumococcus, respiratory virus panel and blood cultures were negative.  She was treated initially with Rocephin and azithromycin which was broadened to cefepime due to lack of clinical improvement.  Discharged with Augmentin  States that she is doing better since her discharge.  She still has some nonproductive cough otherwise breathing is improved.  No fevers, chills. Methotrxate is on hold.  She continues on Plaquenil for  rheumatoid arthritis.  Outpatient Encounter Medications as of 10/13/2018  Medication Sig  . albuterol (PROVENTIL HFA;VENTOLIN HFA) 108 (90 Base) MCG/ACT inhaler Inhale 2 puffs into the lungs every 6 (six) hours as needed for wheezing or shortness of breath.  . calcium citrate (CALCITRATE - DOSED IN MG ELEMENTAL CALCIUM) 950 MG tablet Take 200 mg of elemental calcium by mouth daily.  . cholecalciferol (VITAMIN D) 1000 units tablet Take 1,000 Units by mouth daily.  . citalopram (CELEXA) 40 MG tablet Take 40 mg by mouth daily.   . folic acid (FOLVITE) 1 MG tablet TAKE 2 TABLETS EVERY DAY (Patient taking differently: Take 2 mg by mouth daily. )  . hydroxychloroquine (PLAQUENIL) 200 MG tablet TAKE 1 TABLET (200 MG TOTAL) BY MOUTH DAILY.  Marland Kitchen levothyroxine (SYNTHROID, LEVOTHROID) 50 MCG tablet Take 50 mcg by mouth daily before breakfast.   . Multiple Vitamin (MULTIVITAMIN) capsule Take 1 capsule by mouth daily.  . raloxifene (EVISTA) 60 MG tablet Take 60 mg by mouth daily.   . traZODone (DESYREL) 50 MG tablet Take 25 mg by mouth at bedtime.   . [DISCONTINUED] amoxicillin-clavulanate (AUGMENTIN) 875-125 MG tablet Take 1 tablet by mouth 2 (two) times daily.  . [DISCONTINUED] guaiFENesin (MUCINEX) 600 MG 12 hr tablet Take 1 tablet (600 mg total) by mouth 2 (two) times daily.  Marland Kitchen dextromethorphan (DELSYM) 30 MG/5ML liquid Take 30 mg by mouth daily as needed for cough.  . methotrexate (RHEUMATREX) 2.5 MG tablet Take 4 tablets (10 mg total) by mouth every Tuesday. (Patient not taking: Reported on 10/13/2018)   No facility-administered encounter medications on file as of 10/13/2018.     Allergies as of 10/13/2018  . (No Known  Allergies)    Past Medical History:  Diagnosis Date  . Arthritis   . Cancer ([place])   . Osteoarthritis     Past Surgical History:  Procedure Laterality Date  . BACK SURGERY    . BREAST RECONSTRUCTION      Family History  Family history unknown: Yes    Social History    Socioeconomic History  . Marital status: Married    Spouse name: Not on file  . Number of children: Not on file  . Years of education: Not on file  . Highest education level: Not on file  Occupational History  . Not on file  Social Needs  . Financial resource strain: Not on file  . Food insecurity:    Worry: Not on file    Inability: Not on file  . Transportation needs:    Medical: Not on file    Non-medical: Not on file  Tobacco Use  . Smoking status: Never Smoker  . Smokeless tobacco: Never Used  Substance and Sexual Activity  . Alcohol use: Yes    Alcohol/week: 1.0 standard drinks    Types: 1 Glasses of wine per week  . Drug use: No  . Sexual activity: Not on file  Lifestyle  . Physical activity:    Days per week: Not on file    Minutes per session: Not on file  . Stress: Not on file  Relationships  . Social connections:    Talks on phone: Not on file    Gets together: Not on file    Attends religious service: Not on file    Active member of club or organization: Not on file    Attends meetings of clubs or organizations: Not on file    Relationship status: Not on file  . Intimate partner violence:    Fear of current or ex partner: Not on file    Emotionally abused: Not on file    Physically abused: Not on file    Forced sexual activity: Not on file  Other Topics Concern  . Not on file  Social History Narrative  . Not on file    Review of systems: Review of Systems  Constitutional: Negative for fever and chills.  HENT: Negative.   Eyes: Negative for blurred vision.  Respiratory: as per HPI  Cardiovascular: Negative for chest pain and palpitations.  Gastrointestinal: Negative for vomiting, diarrhea, blood per rectum. Genitourinary: Negative for dysuria, urgency, frequency and hematuria.  Musculoskeletal: Negative for myalgias, back pain and joint pain.  Skin: Negative for itching and rash.  Neurological: Negative for dizziness, tremors, focal weakness,  seizures and loss of consciousness.  Endo/Heme/Allergies: Negative for environmental allergies.  Psychiatric/Behavioral: Negative for depression, suicidal ideas and hallucinations.  All other systems reviewed and are negative.  Physical Exam: Blood pressure 128/68, pulse 79, height 5\' 3"  (1.6 m), weight 128 lb (58.1 kg), SpO2 96 %. Gen:      No acute distress HEENT:  EOMI, sclera anicteric Neck:     No masses; no thyromegaly Lungs:    Clear to auscultation bilaterally; normal respiratory effort CV:         Regular rate and rhythm; no murmurs Abd:      + bowel sounds; soft, non-tender; no palpable masses, no distension Ext:    No edema; adequate peripheral perfusion Skin:      Warm and dry; no rash Neuro: alert and oriented x 3 Psych: normal mood and affect  Data Reviewed: DATA from [facility] Chest  x-ray 12/16/16- left upper lobe consolidation, right mid lung consolidation CT chest 12/18/16- Small to moderate bilateral pleural effusion, dense alveolar infiltrate throughout the left upper lobe, right middle lobe and left lower lobe with underlying nodularity. Mediastinal lymphadenopathy.  CT scan 04/23/17-  Mild bronchiectasis and mucous plugging in the right middle lobe, lingula. No lymphadenopathy.  Chest x-ray 09/21/2018- right upper lobe airspace opacity. Chest x-ray 09/25/2018- progressive right upper lobe and left upper lobe infiltrates. I have reviewed the images personally.  PFTs 08/06/17 FVC 1.82 [73%], FEV1 1.32 [71%], F/F 73, TLC 67%, DLCO 70% Mild obstruction, moderate restriction, mild diffusion defect  Assessment:  Follow-up after recent multilobar pneumonia She is recovering well after her recent admission We will get a chest x-ray to make sure the infiltrate is clearing up Since this is the second time in the past few years she was hospitalized with multilobar pneumonia I have advised her to hold methotrexate indefinitely.  Continue Plaquenil.  Follow-up with Dr.  Estanislado Pandy, rheumatology  Bronchiectasis Prior CT scan shows some mild bronchiectasis with mucus plugging. This could be a residual of pneumonia. Atypical infection such as MAI is possible but suspicion is not high. Other possibilities include pulmonary manifestations of her rheumatoid arthritis which can present as bronchiectasis.  Continue monitoring.  Will need a follow-up CT scan of the chest once the pneumonia has cleared up.  Plan/Recommendations: - Chest x-ray Follow-up in 1 to 2 months.  Marshell Garfinkel MD [facility] Pager [phone] 10/13/2018, 1:52 PM  CC: Tisovec, Fransico Him, MD

## 2018-10-13 NOTE — Patient Instructions (Addendum)
I am glad you are better after your recent hospitalization Get a chest x-ray to make sure the pneumonia is clearing up Continue to hold methotrexate We will check with your primary care to see if you received the pneumonia vaccine.  Follow-up in 1 to 2 months.

## 2018-10-14 ENCOUNTER — Ambulatory Visit: Payer: Self-pay | Admitting: Psychiatry

## 2018-10-15 ENCOUNTER — Telehealth: Payer: Self-pay | Admitting: Pulmonary Disease

## 2018-10-15 ENCOUNTER — Telehealth: Payer: Self-pay | Admitting: *Deleted

## 2018-10-15 MED ORDER — PREDNISONE 10 MG PO TABS: ORAL_TABLET | ORAL | 0 refills | Status: DC

## 2018-10-15 MED ORDER — BENZONATATE 100 MG PO CAPS: 200.0000 mg | ORAL_CAPSULE | Freq: Two times a day (BID) | ORAL | 0 refills | Status: DC | PRN

## 2018-10-15 NOTE — Telephone Encounter (Signed)
Call in prednisone taper starting at 40 mg.  Reduce dose by 10 mg every 2 days Tessalon Perles 200 mg twice daily as needed for cough She can use Delsym over-the-counter as well for cough

## 2018-10-15 NOTE — Telephone Encounter (Signed)
Patient advised to discontinue MTX. Patient verbalized understanding.

## 2018-10-15 NOTE — Telephone Encounter (Signed)
Spoke with patient She stated that she feels the same as when seen by Dr. Vaughan Browner on 10/13/18. Patient is complaining of cough and chest tightness. Patient denies fever, SOB, weakness. Patient denies taking any OTC's to help with cough. Cough is non productive and would like something to be sent to pharmacy to help   Dr. Vaughan Browner - please advise. Thank you.

## 2018-10-15 NOTE — Telephone Encounter (Signed)
Spoke with patient, advised her of Dr. Matilde Bash response. Confirmed pharmacy. Patient voiced understanding. Nothing further needed at this time.  Dr. Vaughan Browner already left for afternoon at time of patient call back. Contacted Margie who confirmed that per Dr. Matilde Bash preference to send [facility] with 45 disp and 0 refills.  Prescription sent.

## 2018-10-15 NOTE — Telephone Encounter (Signed)
-----   Message from Bo Merino, MD sent at 10/15/2018 12:37 PM EST ----- Please notify patient to discontinue methotrexate per Dr. Matilde Bash recommendations. Bo Merino, MD  ----- Message ----- From: Marshell Garfinkel, MD Sent: 10/14/2018  12:15 PM EST To: Bo Merino, MD  Hi,  I saw Megan Porter yesterday. She is recovering well from the pneumonia As she has had recurrent infections with severe pneumonias I think it may be advisable to hold the methotrexate, perhaps indefinitely as long as RA does not act up. I am not sure how difficult it was to control her RA symptoms in past.   What do you think?  Thanks [employer]

## 2018-10-23 ENCOUNTER — Other Ambulatory Visit: Payer: Self-pay

## 2018-10-23 ENCOUNTER — Other Ambulatory Visit: Payer: Self-pay | Admitting: *Deleted

## 2018-10-23 MED ORDER — CITALOPRAM HYDROBROMIDE 40 MG PO TABS: 40.0000 mg | ORAL_TABLET | Freq: Every day | ORAL | 0 refills | Status: DC

## 2018-10-23 MED ORDER — HYDROXYCHLOROQUINE SULFATE 200 MG PO TABS: 200.0000 mg | ORAL_TABLET | Freq: Every day | ORAL | 0 refills | Status: DC

## 2018-10-23 MED ORDER — FOLIC ACID 1 MG PO TABS: 2.0000 mg | ORAL_TABLET | Freq: Every day | ORAL | 3 refills | Status: DC

## 2018-10-23 MED ORDER — TRAZODONE HCL 50 MG PO TABS: 25.0000 mg | ORAL_TABLET | Freq: Every day | ORAL | 0 refills | Status: DC

## 2018-10-23 NOTE — Telephone Encounter (Signed)
ok 

## 2018-10-23 NOTE — Telephone Encounter (Signed)
Refill request received via fax   Last Visit: 08/01/18 Next Visit: 01/08/19 Labs: 09/26/18 Calcium 7.8 WBC 14.6 RBC 3.22, Hgb 10.6 Hct 32.4 MCV 100.6  PLQ Eye Exam 12/24/17 WNL  Okay to refill PLQ and folic acid?

## 2018-11-25 ENCOUNTER — Ambulatory Visit: Payer: Self-pay | Admitting: Psychiatry

## 2018-12-05 ENCOUNTER — Telehealth: Payer: Self-pay | Admitting: Pharmacy Technician

## 2018-12-05 DIAGNOSIS — M0609 Rheumatoid arthritis without rheumatoid factor, multiple sites: Secondary | ICD-10-CM

## 2018-12-05 MED ORDER — HYDROXYCHLOROQUINE SULFATE 200 MG PO TABS: 200.0000 mg | ORAL_TABLET | Freq: Every day | ORAL | 0 refills | Status: DC

## 2018-12-05 NOTE — Telephone Encounter (Signed)
Patient is on Plaquenil. Please send in prescription.  Called and spoke to patient.

## 2018-12-12 ENCOUNTER — Other Ambulatory Visit: Payer: Self-pay

## 2018-12-12 ENCOUNTER — Ambulatory Visit (INDEPENDENT_AMBULATORY_CARE_PROVIDER_SITE_OTHER): Payer: Medicare Other | Admitting: Pulmonary Disease

## 2018-12-12 ENCOUNTER — Ambulatory Visit: Payer: PRIVATE HEALTH INSURANCE | Admitting: Pulmonary Disease

## 2018-12-12 ENCOUNTER — Encounter: Payer: Self-pay | Admitting: Pulmonary Disease

## 2018-12-12 DIAGNOSIS — M0609 Rheumatoid arthritis without rheumatoid factor, multiple sites: Secondary | ICD-10-CM

## 2018-12-12 DIAGNOSIS — J189 Pneumonia, unspecified organism: Secondary | ICD-10-CM

## 2018-12-12 DIAGNOSIS — M19041 Primary osteoarthritis, right hand: Secondary | ICD-10-CM | POA: Diagnosis not present

## 2018-12-12 DIAGNOSIS — J479 Bronchiectasis, uncomplicated: Secondary | ICD-10-CM

## 2018-12-12 DIAGNOSIS — J181 Lobar pneumonia, unspecified organism: Secondary | ICD-10-CM | POA: Diagnosis not present

## 2018-12-12 DIAGNOSIS — M19042 Primary osteoarthritis, left hand: Secondary | ICD-10-CM

## 2018-12-12 NOTE — Assessment & Plan Note (Signed)
Plan: Continue follow-up with rheumatology 

## 2018-12-12 NOTE — Patient Instructions (Signed)
We will continue to monitor you clinically  If your breathing worsens, you start having fatigue, or fevers start then please contact our office  I recommend that you contact rheumatology and discuss your worsening symptoms  Continue Plaquenil at this time  We will have a telephonic out reach in 6 weeks to check in with you and your symptoms after you contact rheumatology          Coronavirus (COVID-19) Are you at risk?  Are you at risk for the Coronavirus (COVID-19)?  To be considered HIGH RISK for Coronavirus (COVID-19), you have to meet the following criteria:  . Traveled to [place], [place], [place], [place] or [place]; or in the [place] to [place], [place], [place], or [place]; and have fever, cough, and shortness of breath within the last 2 weeks of travel OR . Been in close contact with a person diagnosed with COVID-19 within the last 2 weeks and have fever, cough, and shortness of breath . IF YOU DO NOT MEET THESE CRITERIA, YOU ARE CONSIDERED LOW RISK FOR COVID-19.  What to do if you are HIGH RISK for COVID-19?  Marland Kitchen If you are having a medical emergency, call 911. . Seek medical care right away. Before you go to a doctor's office, urgent care or emergency department, call ahead and tell them about your recent travel, contact with someone diagnosed with COVID-19, and your symptoms. You should receive instructions from your physician's office regarding next steps of care.  . When you arrive at healthcare provider, tell the healthcare staff immediately you have returned from visiting [place], [place], [place], [place] or [place]; or traveled in the [place] to [place], [employer], [place], or [place]; in the last two weeks or you have been in close contact with a person diagnosed with COVID-19 in the last 2 weeks.   . Tell the health care staff about your symptoms: fever, cough and shortness of breath. . After you have been seen by a medical provider, you will  be either: o Tested for (COVID-19) and discharged home on quarantine except to seek medical care if symptoms worsen, and asked to  - Stay home and avoid contact with others until you get your results (4-5 days)  - Avoid travel on public transportation if possible (such as bus, train, or airplane) or o Sent to the Emergency Department by EMS for evaluation, COVID-19 testing, and possible admission depending on your condition and test results.  What to do if you are LOW RISK for COVID-19?  Reduce your risk of any infection by using the same precautions used for avoiding the common cold or flu:  Marland Kitchen Wash your hands often with soap and warm water for at least 20 seconds.  If soap and water are not readily available, use an alcohol-based hand sanitizer with at least 60% alcohol.  . If coughing or sneezing, cover your mouth and nose by coughing or sneezing into the elbow areas of your shirt or coat, into a tissue or into your sleeve (not your hands). . Avoid shaking hands with others and consider head nods or verbal greetings only. . Avoid touching your eyes, nose, or mouth with unwashed hands.  . Avoid close contact with people who are sick. . Avoid places or events with large numbers of people in one location, like concerts or sporting events. . Carefully consider travel plans you have or are making. . If you are planning any travel outside or inside the [place],  visit the CDC's Travelers' Health webpage for the latest health notices. . If you have some symptoms but not all symptoms, continue to monitor at home and seek medical attention if your symptoms worsen. . If you are having a medical emergency, call 911.   [place] / e-Visit: [email or web address]         [facility]: [facility]: [phone]                   [facility]: [phone]            It is flu season:   >>> Best ways to protect herself from the flu: Receive the yearly flu vaccine, practice good hand hygiene washing with soap and also using hand sanitizer when available, eat a nutritious meals, get adequate rest, hydrate appropriately   Please contact the office if your symptoms worsen or you have concerns that you are not improving.   Thank you for choosing [facility] for your healthcare, and for allowing [place] to partner with you on your healthcare journey. I am thankful to be able to provide care to you today.   [name] FNP-C

## 2018-12-12 NOTE — Assessment & Plan Note (Signed)
Assessment: Stable at this time  Plan: Continue to monitor clinically

## 2018-12-12 NOTE — Progress Notes (Signed)
Virtual Visit via Telephone Note  I connected with Megan Porter on 12/12/18 at 10:30 AM EDT by telephone and verified that I am speaking with the correct person using two identifiers.   I discussed the limitations, risks, security and privacy concerns of performing an evaluation and management service by telephone and the availability of in person appointments. I also discussed with the patient that there may be a patient responsible charge related to this service. The patient expressed understanding and agreed to proceed.   History of Present Illness:  Patient consented to consult via telephone: yes People present and their role in pt care: Pt  Chief complaint: Medication Question - MTX / RA management   78 year old female followed in our office for bronchiectasis and recurrent pneumonias.  Patient last seen in our office in January/2020 where it was suggested to have the patient stop her methotrexate which she had been managed on for her rheumatoid arthritis.  It was felt that the methotrexate could be contributing to her recurrent pneumonias.  Patient continues to be followed by rheumatology Dr. Estanislado Pandy office.  Patient continues to be managed on Plaquenil.  Patient reports that her breathing has been stable since then.  She denies fevers, fatigue, chills.  She does endorse that she has had increased body aches and stiffness over the last month which she feels is directly related to her rheumatoid arthritis.  She was wondering today if there to be any consideration of either restarting methotrexate or there are other medications for management of her rheumatoid arthritis.  Patient reports that she plans on contacting rheumatology today to update them on her symptoms, she has not done that yet so far.  She has been treating with Tylenol occasionally.   Observations/Objective:  DATA from [facility] Chest x-ray 12/16/16- left upper lobe consolidation, right mid lung consolidation CT chest  12/18/16- Small to moderate bilateral pleural effusion, dense alveolar infiltrate throughout the left upper lobe, right middle lobe and left lower lobe with underlying nodularity. Mediastinal lymphadenopathy.  CT scan 04/23/17-  Mild bronchiectasis and mucous plugging in the right middle lobe, lingula. No lymphadenopathy.  Chest x-ray 09/21/2018- right upper lobe airspace opacity. Chest x-ray 09/25/2018- progressive right upper lobe and left upper lobe infiltrates.  PFTs 08/06/17 FVC 1.82 [73%], FEV1 1.32 [71%], F/F 73, TLC 67%, DLCO 70% Mild obstruction, moderate restriction, mild diffusion defect  Assessment and Plan:  Rheumatoid arthritis, seronegative, multiple sites [facility]) Plan: Continue Plaquenil Continue follow-up with rheumatology  Pneumonia Assessment: Patient denies fever, fatigue, worsened appetite Patient reports that clinically she feels stable January/2020 chest x-ray shows improving bilateral infiltrates  Plan: Consider chest x-ray at next office visit in person Continue to monitor clinically at this time   Bronchiectasis without complication ([place]) Assessment: Stable at this time  Plan: Continue to monitor clinically  Primary osteoarthritis of both hands Plan: Continue follow-up with rheumatology  Follow Up Instructions:  Follow up in 6 weeks telephonically with [name] FNP    I discussed the assessment and treatment plan with the patient. The patient was provided an opportunity to ask questions and all were answered. The patient agreed with the plan and demonstrated an understanding of the instructions.   The patient was advised to call back or seek an in-person evaluation if the symptoms worsen or if the condition fails to improve as anticipated.  I provided 22 minutes of non-face-to-face time during this encounter.   Lauraine Rinne, NP

## 2018-12-12 NOTE — Assessment & Plan Note (Signed)
Plan: Continue Plaquenil Continue follow-up with rheumatology 

## 2018-12-12 NOTE — Assessment & Plan Note (Signed)
Assessment: Patient denies fever, fatigue, worsened appetite Patient reports that clinically she feels stable January/2020 chest x-ray shows improving bilateral infiltrates  Plan: Consider chest x-ray at next office visit in person Continue to monitor clinically at this time

## 2018-12-25 ENCOUNTER — Other Ambulatory Visit: Payer: Self-pay | Admitting: Rheumatology

## 2018-12-25 NOTE — Telephone Encounter (Signed)
Prednisone starting at 20 mg and decrease by 5 mg every 2 days.  Attempted to contact the patient and left message for patient to call the office. [place]

## 2018-12-25 NOTE — Telephone Encounter (Signed)
Patient states she had pneumonia in January 2020. Patient states she was taken off MTX. Patient states she has been having leg in her pain . Patient states she has trouble going from a sitting to a standing positions. Patient states she is in pain and it is interfering with her sleep. Patient states she is having knee ankle pain. Patient states she is having swelling in her ankles.

## 2018-12-25 NOTE — Telephone Encounter (Signed)
Patient called stating she stopped taking Methotrexate a couple of months ago due to having Pneumonia.  Patient states she has been in [facility] for the last month and her leg pain has gotten worse.  Patient states her ankles are swollen and has difficulty walking.  Patient is scheduled for a virtual phone appointment on 4/17 and is requesting a return call to let her know if there is anything she can take.  Patient states she has been prescribed Prednisone in the past.

## 2018-12-26 ENCOUNTER — Telehealth: Payer: Self-pay | Admitting: Physician Assistant

## 2018-12-26 ENCOUNTER — Other Ambulatory Visit: Payer: Self-pay | Admitting: Physician Assistant

## 2018-12-26 MED ORDER — PREDNISONE 5 MG PO TABS: ORAL_TABLET | ORAL | 0 refills | Status: DC

## 2018-12-26 NOTE — Telephone Encounter (Signed)
I spoke on the phone with the patient at [facility] on 12/26/18 about the flare she is experiencing.  She is having difficulty walking due the the joint pain and joint swelling in bilateral ankle joints.  She requested a prednisone taper.  A taper starting at 20 mg and tapering by 5 mg every 2 days was sent to the pharmacy.  She has an upcoming virtual visit scheduled, but she was advised to call us back if she develops any new or worsening symptoms.   Prescription was sent in to the patient's preferred pharmacy.   Hazel Sams, PA-C

## 2018-12-29 NOTE — Telephone Encounter (Signed)
Megan Porter spoke with patient on 12/26/2018 and sent prescription in.

## 2018-12-30 NOTE — Progress Notes (Signed)
Virtual Visit via Telephone Note  I connected with Megan Porter on 12/30/18 at 10:45 AM EDT by telephone and verified that I am speaking with the correct person using two identifiers.   I discussed the limitations, risks, security and privacy concerns of performing an evaluation and management service by telephone and the availability of in person appointments. I also discussed with the patient that there may be a patient responsible charge related to this service. The patient expressed understanding and agreed to proceed.  CC: Pain in multiple joints   History of Present Illness: Patient is a 78 year old female with a past medical history of seronegative rheumatoid arthritis, osteoarthritis, DDD, and osteoporosis. She is on PLQ 200 mg daily.  She was taken off of MTX due to having pneumonia in January.  She was advised to not restart on MTX due to the susceptibility of infection. She started having a flare about 2 weeks ago. She was having pain and swelling in both ankle joints. She was having difficulty walking. She was started on a prednisone taper starting at 20 mg tapering by 5 mg every 2 days on 12/26/18.  She is having bilateral trochanteric bursitis.  She would like to discuss other medications since she cannot restart on MTX.   Review of Systems  Constitutional: Negative for fever and malaise/fatigue.  Eyes: Negative for photophobia, pain, discharge and redness.  Respiratory: Negative for cough, shortness of breath and wheezing.   Cardiovascular: Negative for chest pain and palpitations.  Gastrointestinal: Positive for constipation. Negative for blood in stool and diarrhea.  Genitourinary: Negative for dysuria.  Musculoskeletal: Positive for joint pain. Negative for back pain, myalgias and neck pain.  Skin: Negative for rash.  Neurological: Negative for dizziness and headaches.  Psychiatric/Behavioral: Negative for depression. The patient is not nervous/anxious and does not have  insomnia.       Observations/Objective: Physical Exam  Constitutional: She is oriented to person, place, and time.  Neurological: She is alert and oriented to person, place, and time.  Psychiatric: Mood, memory, affect and judgment normal.   Patient reports morning stiffness for 5 minutes.   Patient Reports nocturnal pain.  Difficulty dressing/grooming: Denies Difficulty climbing stairs: Reports Difficulty getting out of chair: Denies Difficulty using hands for taps, buttons, cutlery, and/or writing: Reports  Assessment and Plan: Rheumatoid arthritis, seronegative, multiple sites ([place]) - Erosive disease neg RF,CCP, and ANA: She has been having more frequent and severe rheumatoid arthritis flares.  She is having pain and swelling in both ankle joints.  She is on plaquenil 200 mg 1 tablet by mouth daily.  She was previously taking MTX 4 tablets po once weekly but discontinued in January due to recurrent infections (pneumonia x2 in 1 year). She was advised to increase her dose of Plaquenil to 200 mg 1 tablet by mouth twice daily Monday through Friday.  We discussed adding on sulfasalazine if she continues to have frequent flares.  Indications, contraindications, and potential side effects of SSZ were discussed.  She will notify us if increasing the dose of PLQ does not help.  A prednisone taper starting at 20 mg and tapering by 5 mg every 2 days was started on 12/26/18, which helped her ankle joint pain and swelling. She will follow up in 3 months and we will reassess how she is due on the increased dose of PLQ.   Medication counseling:  Does the patient have an allergy to sulfa drugs? No  10/18/2010 G6PD negative  Patient was counseled  on the purpose, proper use, and adverse effects of sulfasalazine including risk of infection and chance of nausea, headache, and sun sensitivity.    High risk medications (not anticoagulants) long-term use - Plaquenil 200 mg 1 tablet by mouth daily. D/c MTX in  January 2020 due to recurrent infections-pneumonia x2 in 1 yr.  Eye exam: 12/24/2017.   We discussed adding on Sulfasalazine in the future if she continues to flare after increasing the dose of PLQ.  Primary osteoarthritis of both hands: She has no hand pain or joint swelling at this time.  Primary osteoarthritis of both knees: She has no knee joint pain or joint swelling.   Primary osteoarthritis of both feet: She is having increased pain in both feet and both ankle joints.  She has noticed joint swelling and joint stiffness.    Trochanteric bursitis of both hips: She has tenderness bilaterally.  She has discomfort at night when she is sleeping on her sides.  She has been sitting for prolonged periods of time and have been more sedentary recently. She was encouraged to change positions frequently.   DDD (degenerative disc disease), lumbar history of lumbar surgery 1970:   Age-related osteoporosis without current pathological fracture - She is on Evista . Her gynecologist monitors her bone density.   Follow Up Instructions: She will follow up in 3 months.  She will follow up in June 2020 for updated lab work.   I discussed the assessment and treatment plan with the patient. The patient was provided an opportunity to ask questions and all were answered. The patient agreed with the plan and demonstrated an understanding of the instructions.   The patient was advised to call back or seek an in-person evaluation if the symptoms worsen or if the condition fails to improve as anticipated.  I provided 22 minutes of non-face-to-face time during this encounter.  Bo Merino, MD  Scribed by- Ofilia Neas, PA-C

## 2019-01-01 ENCOUNTER — Ambulatory Visit: Payer: Medicare Other | Admitting: Rheumatology

## 2019-01-02 ENCOUNTER — Encounter: Payer: Self-pay | Admitting: Rheumatology

## 2019-01-02 ENCOUNTER — Telehealth (INDEPENDENT_AMBULATORY_CARE_PROVIDER_SITE_OTHER): Payer: Medicare Other | Admitting: Rheumatology

## 2019-01-02 DIAGNOSIS — Z8659 Personal history of other mental and behavioral disorders: Secondary | ICD-10-CM

## 2019-01-02 DIAGNOSIS — Z79899 Other long term (current) drug therapy: Secondary | ICD-10-CM

## 2019-01-02 DIAGNOSIS — M7062 Trochanteric bursitis, left hip: Secondary | ICD-10-CM

## 2019-01-02 DIAGNOSIS — M5136 Other intervertebral disc degeneration, lumbar region: Secondary | ICD-10-CM

## 2019-01-02 DIAGNOSIS — M0609 Rheumatoid arthritis without rheumatoid factor, multiple sites: Secondary | ICD-10-CM | POA: Diagnosis not present

## 2019-01-02 DIAGNOSIS — M19072 Primary osteoarthritis, left ankle and foot: Secondary | ICD-10-CM

## 2019-01-02 DIAGNOSIS — M19041 Primary osteoarthritis, right hand: Secondary | ICD-10-CM

## 2019-01-02 DIAGNOSIS — M810 Age-related osteoporosis without current pathological fracture: Secondary | ICD-10-CM

## 2019-01-02 DIAGNOSIS — M19042 Primary osteoarthritis, left hand: Secondary | ICD-10-CM

## 2019-01-02 DIAGNOSIS — M7061 Trochanteric bursitis, right hip: Secondary | ICD-10-CM

## 2019-01-02 DIAGNOSIS — M170 Bilateral primary osteoarthritis of knee: Secondary | ICD-10-CM

## 2019-01-02 DIAGNOSIS — M19071 Primary osteoarthritis, right ankle and foot: Secondary | ICD-10-CM

## 2019-01-02 DIAGNOSIS — Z853 Personal history of malignant neoplasm of breast: Secondary | ICD-10-CM

## 2019-01-08 ENCOUNTER — Ambulatory Visit: Payer: Medicare Other | Admitting: Rheumatology

## 2019-01-14 ENCOUNTER — Other Ambulatory Visit: Payer: Self-pay | Admitting: Rheumatology

## 2019-01-14 DIAGNOSIS — M0609 Rheumatoid arthritis without rheumatoid factor, multiple sites: Secondary | ICD-10-CM

## 2019-01-14 NOTE — Telephone Encounter (Signed)
Last Visit: 01/02/19 Next Visit: 04/07/19 Labs: 09/26/18 Calcium 7.8 WBC 14.6 RBC 3.22, Hgb 10.6 Hct 32.4 MCV 100.6  PLQ Eye Exam 12/24/17 WNL  Okay to refill per Dr. Estanislado Pandy

## 2019-01-15 ENCOUNTER — Other Ambulatory Visit: Payer: Self-pay

## 2019-01-15 MED ORDER — CITALOPRAM HYDROBROMIDE 40 MG PO TABS: 40.0000 mg | ORAL_TABLET | Freq: Every day | ORAL | 0 refills | Status: DC

## 2019-01-15 MED ORDER — TRAZODONE HCL 50 MG PO TABS: 25.0000 mg | ORAL_TABLET | Freq: Every day | ORAL | 0 refills | Status: DC

## 2019-01-29 ENCOUNTER — Other Ambulatory Visit: Payer: Self-pay

## 2019-01-29 ENCOUNTER — Ambulatory Visit (INDEPENDENT_AMBULATORY_CARE_PROVIDER_SITE_OTHER): Payer: Medicare Other | Admitting: Physician Assistant

## 2019-01-29 ENCOUNTER — Encounter: Payer: Self-pay | Admitting: Physician Assistant

## 2019-01-29 DIAGNOSIS — F411 Generalized anxiety disorder: Secondary | ICD-10-CM

## 2019-01-29 DIAGNOSIS — G4700 Insomnia, unspecified: Secondary | ICD-10-CM

## 2019-01-29 MED ORDER — TRAZODONE HCL 50 MG PO TABS: 25.0000 mg | ORAL_TABLET | Freq: Every day | ORAL | 0 refills | Status: DC

## 2019-01-29 NOTE — Progress Notes (Signed)
[facility] Check  Patient ID: Megan Porter,  MRN: [id number]  PCP: Haywood Pao, MD  Date of Evaluation: 01/29/2019 Time spent:15 minutes  Chief Complaint:  Chief Complaint    Follow-up     Virtual Visit via Telephone Note  I connected with patient by a video enabled telemedicine application or telephone, with their informed consent, and verified patient privacy and that I am speaking with the correct person using two identifiers.  I am private, in my home and the patient is home.   I discussed the limitations, risks, security and privacy concerns of performing an evaluation and management service by telephone and the availability of in person appointments. I also discussed with the patient that there may be a patient responsible charge related to this service. The patient expressed understanding and agreed to proceed.   I discussed the assessment and treatment plan with the patient. The patient was provided an opportunity to ask questions and all were answered. The patient agreed with the plan and demonstrated an understanding of the instructions.   The patient was advised to call back or seek an in-person evaluation if the symptoms worsen or if the condition fails to improve as anticipated.  I provided 15 minutes of non-face-to-face time during this encounter.  HISTORY/CURRENT STATUS: HPI For routine med check.  Patient denies loss of interest in usual activities and is able to enjoy things.  Denies decreased energy or motivation.  Appetite has not changed.  No extreme sadness, tearfulness, or feelings of hopelessness.  Denies any changes in concentration, making decisions or remembering things.  Denies suicidal or homicidal thoughts. "I'm doing really well."  She does have trouble sleeping through the night sometimes, but Trazodone helps.  Anxiety has not an extreme problem right now but she does have trouble "over thinking" things.  It is much better than it used to  be though.  Denies dizziness, syncope, seizures, numbness, tingling, tremor, tics, unsteady gait, slurred speech, confusion.  Denies muscle or joint pain, stiffness, or dystonia.  Individual Medical History/ Review of Systems: Changes? :No    Past medications for mental health diagnoses include: I do not have the paper chart in front of me and past meds are unknown.  Allergies: Patient has no known allergies.  Current Medications:  Current Outpatient Medications:  .  calcium citrate (CALCITRATE - DOSED IN MG ELEMENTAL CALCIUM) 950 MG tablet, Take 200 mg of elemental calcium by mouth daily., Disp: , Rfl:  .  cholecalciferol (VITAMIN D) 1000 units tablet, Take 1,000 Units by mouth daily., Disp: , Rfl:  .  citalopram (CELEXA) 40 MG tablet, Take 1 tablet (40 mg total) by mouth daily., Disp: 90 tablet, Rfl: 0 .  folic acid (FOLVITE) 1 MG tablet, Take 2 tablets (2 mg total) by mouth daily., Disp: 180 tablet, Rfl: 3 .  hydroxychloroquine (PLAQUENIL) 200 MG tablet, TAKE 1 TABLET BY MOUTH  DAILY, Disp: 90 tablet, Rfl: 0 .  levothyroxine (SYNTHROID, LEVOTHROID) 50 MCG tablet, Take 50 mcg by mouth daily before breakfast. , Disp: , Rfl:  .  Multiple Vitamin (MULTIVITAMIN) capsule, Take 1 capsule by mouth daily., Disp: , Rfl:  .  raloxifene (EVISTA) 60 MG tablet, Take 60 mg by mouth daily. , Disp: , Rfl:  .  traZODone (DESYREL) 50 MG tablet, Take 0.5 tablets (25 mg total) by mouth at bedtime., Disp: 90 tablet, Rfl: 0 .  albuterol (PROVENTIL HFA;VENTOLIN HFA) 108 (90 Base) MCG/ACT inhaler, Inhale 2 puffs into the lungs every 6 (  six) hours as needed for wheezing or shortness of breath. (Patient not taking: Reported on 01/29/2019), Disp: 1 Inhaler, Rfl: 0 .  benzonatate (TESSALON) 100 MG capsule, Take 2 capsules (200 mg total) by mouth 2 (two) times daily as needed for cough. (Patient not taking: Reported on 01/29/2019), Disp: 45 capsule, Rfl: 0 .  dextromethorphan (DELSYM) 30 MG/5ML liquid, Take 30 mg by  mouth daily as needed for cough., Disp: , Rfl:  .  methotrexate (RHEUMATREX) 2.5 MG tablet, Take 4 tablets (10 mg total) by mouth every Tuesday. (Patient not taking: Reported on 10/13/2018), Disp: , Rfl:  .  predniSONE (DELTASONE) 10 MG tablet, Patient take 4 tablets once daily x 2 days, 3 tablets once daily x 2 days, 2 tablets daily x 2 days, 1 tablet daily x 2 days then stop. (Patient not taking: Reported on 01/29/2019), Disp: 20 tablet, Rfl: 0 .  predniSONE (DELTASONE) 5 MG tablet, Take 4 tablets by mouth daily x2 days, 3 tablets by mouth daily x2 days, 2 tablets by mouth daily x2 days, 1 tablet by mouth daily x2 days. (Patient not taking: Reported on 01/29/2019), Disp: 20 tablet, Rfl: 0 Medication Side Effects: none  Family Medical/ Social History: Changes? No  MENTAL HEALTH EXAM:  There were no vitals taken for this visit.There is no height or weight on file to calculate BMI.  General Appearance: unable to assess  Eye Contact:  unable to assess  Speech:  Clear and Coherent  Volume:  Normal  Mood:  Euthymic  Affect:  unable to assess  Thought Process:  Goal Directed  Orientation:  Full (Time, Place, and Person)  Thought Content: Logical   Suicidal Thoughts:  No  Homicidal Thoughts:  No  Memory:  WNL  Judgement:  Good  Insight:  Good  Psychomotor Activity:  unable to assess  Concentration:  Concentration: Good  Recall:  Good  Fund of Knowledge: Good  Language: Good  Assets:  Desire for Improvement  ADL's:  Intact  Cognition: WNL  Prognosis:  Good    DIAGNOSES:    ICD-10-CM   1. Generalized anxiety disorder F41.1   2. Insomnia, unspecified type G47.00     Receiving Psychotherapy: No    RECOMMENDATIONS:  Continue Celexa 40 mg p.o. daily. Continue trazodone 50 mg, 1/2-1 nightly as needed sleep. Return in 6 months.   Donnal Moat, PA-C

## 2019-01-29 NOTE — Progress Notes (Signed)
_0  ID: Megan Porter, female    DOB: 02/27/1941, 78 y.o.   MRN: [id number]  Chief Complaint  Patient presents with  . Follow-up    6 week follow up with cxry    Referring provider: Haywood Pao, MD  HPI:  78 year old never smoker followed in our office for abnormal chest x-rays, bronchiectasis, respiratory failure.  History of multiple cases of multilobular infiltrates on CT and chest x-ray are resulting in her methotrexate being stopped in January/2020.  PMH: RA (managed on Plaquenil by Dr. Estanislado Pandy), osteoarthritis, breast cancer, anxiety, degenerative disc disease Smoker/ Smoking History: Never smoker Maintenance: No maintenance inhalers, Plaquenil Pt of: Dr Vaughan Browner  01/30/2019  - Visit   78 year old female never smoker followed in our office for abnormal chest x-rays as well as history of multilobular pneumonias.  Patient recently had her methotrexate stopped in January/2020 after Dr. Vaughan Browner suggested that she needed to remain off of this due to the recurrence of bilateral infiltrates on chest x-ray.  Patient followed up with rheumatology in April/2020 and it was recommended that the patient double her Plaquenil, a prednisone taper was provided and patient is to follow-up in 3 months.  Patient reports that her symptoms have improved since following up with rheumatology.  At that time she was having a lot of synovitis symptoms.  Patient also is having worsened arthritis in her orthopedic provider started her on meloxicam.  This has helped as well.  Patient does not have any maintenance inhalers.  Chest x-ray in office today shows:   01/30/2019-resolution of prior right-sided airspace disease, persisting and increased opacity in the left midlung may reflect chronic changes and scarring including changes related to chronic MAC, however superimposed lobular pneumonia cannot be excluded  Patient reports that clinically she feels significantly better since January/2020.  Patient  reports that overall her breathing has been stable.   Tests:   DATA from [facility] Chest x-ray 12/16/16- left upper lobe consolidation, right mid lung consolidation CT chest 12/18/16- Small to moderate bilateral pleural effusion, dense alveolar infiltrate throughout the left upper lobe, right middle lobe and left lower lobe with underlying nodularity. Mediastinal lymphadenopathy.  CT scan 04/23/17-  Mild bronchiectasis and mucous plugging in the right middle lobe, lingula. No lymphadenopathy.  Chest x-ray 09/21/2018- right upper lobe airspace opacity. Chest x-ray 09/25/2018- progressive right upper lobe and left upper lobe infiltrates.  01/30/2019-resolution of prior right-sided airspace disease, persisting and increased opacity in the left midlung may reflect chronic changes and scarring including changes related to chronic MAC, however superimposed lobular pneumonia cannot be excluded  PFTs 08/06/17 FVC 1.82 [73%], FEV1 1.32 [71%], F/F 73, TLC 67%, DLCO 70% Mild obstruction, moderate restriction, mild diffusion defect  FENO:  No results found for: NITRICOXIDE  PFT: PFT Results Latest Ref Rng & Units 08/06/2017  FVC-Pre L 1.80  FVC-Predicted Pre % 72  FVC-Post L 1.82  FVC-Predicted Post % 73  Pre FEV1/FVC % % 71  Post FEV1/FCV % % 73  FEV1-Pre L 1.27  FEV1-Predicted Pre % 68  FEV1-Post L 1.32  DLCO UNC% % 70  DLCO COR %Predicted % 104    Imaging: Dg Chest 2 View  Result Date: 01/30/2019 CLINICAL DATA:  78 year old female with a history of pneumonia EXAM: CHEST - 2 VIEW COMPARISON:  10/13/2018, 09/25/2018 FINDINGS: Cardiomediastinal silhouette unchanged in size and contour. No interlobular septal thickening. Resolution of prior right-sided airspace disease. There has been progression of opacity at the periphery of the left lung,  likely lingula given the lateral appearance. No pleural effusion. Surgical changes of the left chest. IMPRESSION: Resolution of prior right-sided airspace  disease. Persisting and increased opacity in the left mid lung may reflect chronic changes and scarring, including changes related to chronic MAC, however, superimposed lobar pneumonia cannot be excluded. Electronically Signed   By: Corrie Mckusick D.O.   On: 01/30/2019 09:51      Specialty Problems      Pulmonary Problems   Bronchiectasis without complication ([place])   Pneumonia    Chest x-ray 09/21/2018- right upper lobe airspace opacity. Chest x-ray 09/25/2018- progressive right upper lobe and left upper lobe infiltrates.  01/30/2019-resolution of prior right-sided airspace disease, persisting and increased opacity in the left midlung may reflect chronic changes and scarring including changes related to chronic MAC, however superimposed lobular pneumonia cannot be excluded          No Known Allergies  Immunization History  Administered Date(s) Administered  . Influenza-Unspecified 07/06/2017, 07/24/2018  . Pneumococcal Conjugate-13 04/13/2014  . Pneumococcal Polysaccharide-23 10/13/2018    Past Medical History:  Diagnosis Date  . Arthritis   . Cancer ([place])   . Osteoarthritis     Tobacco History: Social History   Tobacco Use  Smoking Status Never Smoker  Smokeless Tobacco Never Used   Counseling given: Yes  Continue to not smoke   Outpatient Encounter Medications as of 01/30/2019  Medication Sig  . albuterol (PROVENTIL HFA;VENTOLIN HFA) 108 (90 Base) MCG/ACT inhaler Inhale 2 puffs into the lungs every 6 (six) hours as needed for wheezing or shortness of breath.  . calcium citrate (CALCITRATE - DOSED IN MG ELEMENTAL CALCIUM) 950 MG tablet Take 200 mg of elemental calcium by mouth daily.  . cholecalciferol (VITAMIN D) 1000 units tablet Take 1,000 Units by mouth daily.  . citalopram (CELEXA) 40 MG tablet Take 1 tablet (40 mg total) by mouth daily.  Marland Kitchen dextromethorphan (DELSYM) 30 MG/5ML liquid Take 30 mg by mouth daily as needed for cough.  . folic acid (FOLVITE) 1 MG  tablet Take 2 tablets (2 mg total) by mouth daily.  . hydroxychloroquine (PLAQUENIL) 200 MG tablet TAKE 1 TABLET BY MOUTH  DAILY  . levothyroxine (SYNTHROID, LEVOTHROID) 50 MCG tablet Take 50 mcg by mouth daily before breakfast.   . meloxicam (MOBIC) 15 MG tablet meloxicam 15 mg tablet  TAKE 1 TABLET BY MOUTH EVERY DAY WITH MEALS  . Multiple Vitamin (MULTIVITAMIN) capsule Take 1 capsule by mouth daily.  . raloxifene (EVISTA) 60 MG tablet Take 60 mg by mouth daily.   . traZODone (DESYREL) 50 MG tablet Take 0.5-1 tablets (25-50 mg total) by mouth at bedtime.  . benzonatate (TESSALON) 100 MG capsule Take 2 capsules (200 mg total) by mouth 2 (two) times daily as needed for cough. (Patient not taking: Reported on 01/30/2019)  . methotrexate (RHEUMATREX) 2.5 MG tablet Take 4 tablets (10 mg total) by mouth every Tuesday. (Patient not taking: Reported on 01/30/2019)  . [DISCONTINUED] predniSONE (DELTASONE) 10 MG tablet Patient take 4 tablets once daily x 2 days, 3 tablets once daily x 2 days, 2 tablets daily x 2 days, 1 tablet daily x 2 days then stop. (Patient not taking: Reported on 01/30/2019)  . [DISCONTINUED] predniSONE (DELTASONE) 5 MG tablet Take 4 tablets by mouth daily x2 days, 3 tablets by mouth daily x2 days, 2 tablets by mouth daily x2 days, 1 tablet by mouth daily x2 days. (Patient not taking: Reported on 01/30/2019)   No facility-administered encounter medications  on file as of 01/30/2019.      Review of Systems  Review of Systems  Constitutional: Negative for chills, fatigue, fever and unexpected weight change.  HENT: Negative for congestion, sinus pressure and sinus pain.   Respiratory: Negative for cough, chest tightness, shortness of breath and wheezing.   Cardiovascular: Negative for chest pain and palpitations.  Gastrointestinal: Negative for blood in stool, diarrhea, nausea and vomiting.  Genitourinary: Negative for dysuria, frequency and urgency.  Musculoskeletal: Negative for  arthralgias.  Skin: Negative for color change.  Allergic/Immunologic: Negative for environmental allergies and food allergies.  Neurological: Negative for dizziness, light-headedness and headaches.  Psychiatric/Behavioral: Negative for dysphoric mood. The patient is not nervous/anxious.   All other systems reviewed and are negative.    Physical Exam  BP 118/68 (BP Location: Left Arm, Cuff Size: Normal)   Pulse 90   Temp 97.8 F (36.6 C) (Oral)   Ht _0  (1.6 m)   Wt 127 lb (57.6 kg)   SpO2 98%   BMI 22.50 kg/m   Wt Readings from Last 5 Encounters:  01/30/19 127 lb (57.6 kg)  10/13/18 126 lb (57.2 kg)  09/21/18 128 lb 1.4 oz (58.1 kg)  08/01/18 128 lb 12.8 oz (58.4 kg)  03/28/18 131 lb (59.4 kg)     Physical Exam  Constitutional: She is oriented to person, place, and time and well-developed, well-nourished, and in no distress. No distress.  HENT:  Head: Normocephalic and atraumatic.  Right Ear: Hearing, tympanic membrane, external ear and ear canal normal.  Left Ear: Hearing, tympanic membrane, external ear and ear canal normal.  Mouth/Throat: Uvula is midline and oropharynx is clear and moist. No oropharyngeal exudate.  Eyes: Pupils are equal, round, and reactive to light.  Neck: Normal range of motion. Neck supple.  Cardiovascular: Normal rate, regular rhythm and normal heart sounds.  Pulmonary/Chest: Effort normal and breath sounds normal. No accessory muscle usage. No respiratory distress. She has no decreased breath sounds. She has no wheezes. She has no rhonchi.  Abdominal: Soft. Bowel sounds are normal. She exhibits no distension. There is no abdominal tenderness.  Musculoskeletal: Normal range of motion.        General: No edema.     Comments: No active synovitis at this time  Lymphadenopathy:    She has no cervical adenopathy.  Neurological: She is alert and oriented to person, place, and time. Gait normal.  Skin: Skin is warm and dry. She is not diaphoretic.  No erythema.  Psychiatric: Mood, memory, affect and judgment normal.  Nursing note and vitals reviewed.     Lab Results:  CBC    Component Value Date/Time   WBC 14.6 (H) 09/26/2018 0619   RBC 3.22 (L) 09/26/2018 0619   HGB 10.6 (L) 09/26/2018 0619   HCT 32.4 (L) 09/26/2018 0619   PLT 285 09/26/2018 0619   MCV 100.6 (H) 09/26/2018 0619   MCH 32.9 09/26/2018 0619   MCHC 32.7 09/26/2018 0619   RDW 12.9 09/26/2018 0619   LYMPHSABS 0.7 09/21/2018 2031   MONOABS 1.0 09/21/2018 2031   EOSABS 0.2 09/21/2018 2031   BASOSABS 0.2 (H) 09/21/2018 2031    BMET    Component Value Date/Time   NA 139 09/26/2018 0619   K 3.8 09/26/2018 0619   CL 101 09/26/2018 0619   CO2 29 09/26/2018 0619   GLUCOSE 99 09/26/2018 0619   BUN 10 09/26/2018 0619   CREATININE 0.70 09/26/2018 0619   CREATININE 0.76 08/01/2018 1211  CALCIUM 7.8 (L) 09/26/2018 0619   GFRNONAA >60 09/26/2018 0619   GFRNONAA 75 08/01/2018 1211   GFRAA >60 09/26/2018 0619   GFRAA 86 08/01/2018 1211    BNP    Component Value Date/Time   BNP 812.8 (H) 09/24/2018 0656    ProBNP No results found for: PROBNP    Assessment & Plan:     Pneumonia Assessment: Patient denies fever, fatigue, worsened appetite Lung sounds are clear to auscultation Patient reports that she feels clinically stable and in improved since January/2020 Chest x-ray today still shows left airspace disease, radiology reading as improved right airspace disease  Plan: I have discussed this case with Dr. Vaughan Browner, we both agree to proceed forward with a high-resolution chest CT to further evaluate her x-ray Follow-up with our office in 4 weeks   Bronchiectasis without complication ([place]) Assessment: Stable at this time Chest x-ray today shows potential chronic MAC  Plan: High-resolution chest CT ordered  High risk medications (not anticoagulants) long-term use Plan: Continue to take Plaquenil as prescribed by rheumatology Continue  follow-up with [facility] ophthalmology   Rheumatoid arthritis, seronegative, multiple sites [facility] Plan: Continue Plaquenil as prescribed Continue follow-up with rheumatology      Lauraine Rinne, NP 01/30/2019   This appointment was 26 min long with over 50% of the time in direct face-to-face patient care, assessment, plan of care, and follow-up.

## 2019-01-30 ENCOUNTER — Encounter: Payer: Self-pay | Admitting: Pulmonary Disease

## 2019-01-30 ENCOUNTER — Ambulatory Visit (INDEPENDENT_AMBULATORY_CARE_PROVIDER_SITE_OTHER): Payer: Medicare Other

## 2019-01-30 ENCOUNTER — Other Ambulatory Visit: Payer: Self-pay

## 2019-01-30 ENCOUNTER — Ambulatory Visit (INDEPENDENT_AMBULATORY_CARE_PROVIDER_SITE_OTHER): Payer: Medicare Other | Admitting: Pulmonary Disease

## 2019-01-30 VITALS — BP 118/68 | HR 90 | Temp 97.8°F | Ht 63.0 in | Wt 127.0 lb

## 2019-01-30 DIAGNOSIS — J189 Pneumonia, unspecified organism: Secondary | ICD-10-CM

## 2019-01-30 DIAGNOSIS — Z79899 Other long term (current) drug therapy: Secondary | ICD-10-CM | POA: Diagnosis not present

## 2019-01-30 DIAGNOSIS — J181 Lobar pneumonia, unspecified organism: Secondary | ICD-10-CM

## 2019-01-30 DIAGNOSIS — J479 Bronchiectasis, uncomplicated: Secondary | ICD-10-CM

## 2019-01-30 DIAGNOSIS — M0609 Rheumatoid arthritis without rheumatoid factor, multiple sites: Secondary | ICD-10-CM

## 2019-01-30 NOTE — Patient Instructions (Addendum)
We will order a high-resolution CT of your chest to further evaluate the chest x-ray results from today  Continue follow-up with rheumatology Continue to take your Plaquenil as prescribed  Return in about 4 weeks (around 02/27/2019), or if symptoms worsen or fail to improve, for Follow up with [name] FNP-C, Follow up with Dr. Vaughan Browner, After Chest CT.   Coronavirus (COVID-19) Are you at risk?  Are you at risk for the Coronavirus (COVID-19)?  To be considered HIGH RISK for Coronavirus (COVID-19), you have to meet the following criteria:  . Traveled to [place], [place], [place], [place] or [place]; or in the [place] to [place], [place], [place], or [place]; and have fever, cough, and shortness of breath within the last 2 weeks of travel OR . Been in close contact with a person diagnosed with COVID-19 within the last 2 weeks and have fever, cough, and shortness of breath . IF YOU DO NOT MEET THESE CRITERIA, YOU ARE CONSIDERED LOW RISK FOR COVID-19.  What to do if you are HIGH RISK for COVID-19?  Marland Kitchen If you are having a medical emergency, call 911. . Seek medical care right away. Before you go to a doctor's office, urgent care or emergency department, call ahead and tell them about your recent travel, contact with someone diagnosed with COVID-19, and your symptoms. You should receive instructions from your physician's office regarding next steps of care.  . When you arrive at healthcare provider, tell the healthcare staff immediately you have returned from visiting [place], [place], [place], [place] or [place]; or traveled in the [place] to [place], [place], [place], or [place]; in the last two weeks or you have been in close contact with a person diagnosed with COVID-19 in the last 2 weeks.   . Tell the health care staff about your symptoms: fever, cough and shortness of breath. . After you have been seen by a medical provider, you will be either: o Tested for (COVID-19)  and discharged home on quarantine except to seek medical care if symptoms worsen, and asked to  - Stay home and avoid contact with others until you get your results (4-5 days)  - Avoid travel on public transportation if possible (such as bus, train, or airplane) or o Sent to the Emergency Department by EMS for evaluation, COVID-19 testing, and possible admission depending on your condition and test results.  What to do if you are LOW RISK for COVID-19?  Reduce your risk of any infection by using the same precautions used for avoiding the common cold or flu:  Marland Kitchen Wash your hands often with soap and warm water for at least 20 seconds.  If soap and water are not readily available, use an alcohol-based hand sanitizer with at least 60% alcohol.  . If coughing or sneezing, cover your mouth and nose by coughing or sneezing into the elbow areas of your shirt or coat, into a tissue or into your sleeve (not your hands). . Avoid shaking hands with others and consider head nods or verbal greetings only. . Avoid touching your eyes, nose, or mouth with unwashed hands.  . Avoid close contact with people who are sick. . Avoid places or events with large numbers of people in one location, like concerts or sporting events. . Carefully consider travel plans you have or are making. . If you are planning any travel outside or inside the [place], visit the CDC's Travelers' Health webpage for the latest health notices. Marland Kitchen  If you have some symptoms but not all symptoms, continue to monitor at home and seek medical attention if your symptoms worsen. . If you are having a medical emergency, call 911.   [place] / e-Visit: [email or web address]         [facility]: [facility]: [phone]                   [facility]: [phone]           It is flu season:    >>> Best ways to protect herself from the flu: Receive the yearly flu vaccine, practice good hand hygiene washing with soap and also using hand sanitizer when available, eat a nutritious meals, get adequate rest, hydrate appropriately   Please contact the office if your symptoms worsen or you have concerns that you are not improving.   Thank you for choosing  [facility] for your healthcare, and for allowing [place] to partner with you on your healthcare journey. I am thankful to be able to provide care to you today.   [name] FNP-C

## 2019-01-30 NOTE — Assessment & Plan Note (Signed)
Plan: Continue Plaquenil as prescribed Continue follow-up with rheumatology

## 2019-01-30 NOTE — Assessment & Plan Note (Signed)
Assessment: Patient denies fever, fatigue, worsened appetite Lung sounds are clear to auscultation Patient reports that she feels clinically stable and in improved since January/2020 Chest x-ray today still shows left airspace disease, radiology reading as improved right airspace disease  Plan: I have discussed this case with Dr. Vaughan Browner, we both agree to proceed forward with a high-resolution chest CT to further evaluate her x-ray Follow-up with our office in 4 weeks

## 2019-01-30 NOTE — Assessment & Plan Note (Signed)
Plan: Continue to take Plaquenil as prescribed by rheumatology Continue follow-up with [facility] ophthalmology

## 2019-01-30 NOTE — Progress Notes (Signed)
Discussed in office.  Will proceed forward with high-resolution chest CT to further evaluate.  I have discussed this case with Dr. Vaughan Browner.  Follow-up in our office in 4 weeks  [name], FNP

## 2019-01-30 NOTE — Assessment & Plan Note (Signed)
Assessment: Stable at this time Chest x-ray today shows potential chronic MAC  Plan: High-resolution chest CT ordered

## 2019-02-24 ENCOUNTER — Telehealth: Payer: Self-pay | Admitting: *Deleted

## 2019-02-24 NOTE — Telephone Encounter (Signed)

## 2019-02-25 ENCOUNTER — Ambulatory Visit (INDEPENDENT_AMBULATORY_CARE_PROVIDER_SITE_OTHER)
Admission: RE | Admit: 2019-02-25 | Discharge: 2019-02-25 | Disposition: A | Discharge status: Home / Self Care | Payer: Medicare Other | Source: Ambulatory Visit | Attending: Pulmonary Disease | Admitting: Pulmonary Disease

## 2019-02-25 ENCOUNTER — Other Ambulatory Visit: Payer: Self-pay

## 2019-02-25 DIAGNOSIS — J181 Lobar pneumonia, unspecified organism: Secondary | ICD-10-CM | POA: Diagnosis not present

## 2019-02-25 DIAGNOSIS — J189 Pneumonia, unspecified organism: Secondary | ICD-10-CM

## 2019-02-25 NOTE — Progress Notes (Signed)
CT chest high resolution still showing bronchiectasis, please schedule the patient with an appointment next week with Dr. Vaughan Browner to discuss these results. Okay to cancel this weeks apt with me.   [name] FNP

## 2019-02-26 ENCOUNTER — Telehealth: Payer: Self-pay | Admitting: Pulmonary Disease

## 2019-02-26 NOTE — Telephone Encounter (Signed)
Called patient who states she had already spoke with someone earlier re:CT results. Pt is going to the beach on Wed and wants to still keep appt with BM b/c PM does not have another opening until end of month. BM is it ok for pt to still keep f/u on 6/12 with you?

## 2019-02-26 NOTE — Progress Notes (Signed)
_0  ID: Megan Porter, female    DOB: October 24, 1940, 78 y.o.   MRN: [id number]  Chief Complaint  Patient presents with   Follow-up    chest tightness, mucus stuck in throat, ct results    Referring provider: Haywood Pao, MD  HPI:  78 year old never smoker followed in our office for abnormal chest x-rays, bronchiectasis, respiratory failure.  History of multiple cases of multilobular infiltrates on CT and chest x-ray are resulting in her methotrexate being stopped in January/2020.  PMH: RA (managed on Plaquenil by Dr. Estanislado Pandy), osteoarthritis, breast cancer, anxiety, degenerative disc disease Smoker/ Smoking History: Never smoker Maintenance: No maintenance inhalers, Plaquenil Pt of: Dr Vaughan Browner  02/27/2019  - Visit   78 year old female never smoker followed in our office for bronchiectasis.  Patient also has known rheumatoid arthritis and is managed on Plaquenil.  Patient recently completed a high-resolution CT chest as a follow-up for abnormal chest x-rays.  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  She does have baseline cough.  Is nonproductive.  She is not using her flutter valve at home.   Tests:   DATA from [facility] Chest x-ray 12/16/16- left upper lobe consolidation, right mid lung  consolidation CT chest 12/18/16- Small to moderate bilateral pleural effusion, dense alveolar infiltrate throughout the left upper lobe, right middle lobe and left lower lobe with underlying nodularity. Mediastinal lymphadenopathy.  CT scan 04/23/17-  Mild bronchiectasis and mucous plugging in the right middle lobe, lingula. No lymphadenopathy.  Chest x-ray 09/21/2018- right upper lobe airspace opacity. Chest x-ray 09/25/2018- progressive right upper lobe and left upper lobe infiltrates.  01/30/2019-resolution of prior right-sided airspace disease, persisting and increased opacity in the left midlung may reflect chronic changes and scarring including changes related to chronic MAC, however superimposed lobular pneumonia cannot be excluded  02/25/2019-CT chest high-res- clustered nodularity consolidation and mild bronchiectasis in the lingula which is slightly increased compared to prior exam of 2018, there are occasional areas of new nodularity for example in the dependent left lower lobe there is an unchanged minimal nodularity and scarring at the medial right upper lobe and lateral segment right middle lobe unchanged from prior, bronchial wall thickening and scattered bronchial plugging throughout, constellation of findings is generally consistent with 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, no evidence of ILD  PFTs 08/06/17 FVC 1.82 [73%], FEV1 1.32 [71%], F/F 73, TLC 67%, DLCO 70% Mild obstruction, moderate restriction, mild diffusion defect  FENO:  No results found for: NITRICOXIDE  PFT: PFT Results Latest Ref Rng & Units 08/06/2017  FVC-Pre L 1.80  FVC-Predicted Pre % 72  FVC-Post L 1.82  FVC-Predicted Post % 73  Pre FEV1/FVC % % 71  Post FEV1/FCV % % 73  FEV1-Pre L 1.27  FEV1-Predicted Pre % 68  FEV1-Post L 1.32  DLCO UNC% % 70  DLCO COR %Predicted % 104    Imaging: Dg Chest 2 View  Result Date: 01/30/2019 CLINICAL DATA:  78 year old female with a history  of pneumonia EXAM: CHEST - 2 VIEW COMPARISON:  10/13/2018, 09/25/2018 FINDINGS: Cardiomediastinal silhouette unchanged in size and contour. No interlobular septal thickening. Resolution of prior right-sided airspace disease. There has been progression of opacity at the periphery of the left lung, likely lingula given the lateral appearance. No pleural effusion. Surgical changes of the left chest. IMPRESSION: Resolution of prior right-sided airspace disease. Persisting and increased opacity in the left mid lung may reflect chronic changes and scarring, including changes related to chronic MAC, however, superimposed lobar pneumonia cannot be excluded. Electronically Signed   By: Corrie Mckusick D.O.   On: 01/30/2019 09:51   Ct Chest High Resolution  Result Date: 02/25/2019 CLINICAL DATA:  Evaluate for ILD, history of recurrent pneumonia, occasional cough EXAM: CT CHEST WITHOUT CONTRAST TECHNIQUE: Multidetector CT imaging of the chest was performed following the standard protocol without intravenous contrast. High resolution imaging of the lungs, as well as inspiratory and expiratory imaging, was performed. COMPARISON:  04/23/2017 FINDINGS: Cardiovascular: No significant vascular findings. Normal heart size. No pericardial effusion. Mediastinum/Nodes: No enlarged mediastinal, hilar, or axillary lymph nodes. Thyroid gland, trachea, and esophagus demonstrate no significant findings. Lungs/Pleura: There is clustered nodularity, consolidation, and mild bronchiectasis of the lingula, which is slightly increased compared to prior examination dated 2018 (series 3, image 71). There are occasional areas of new nodularity, for example in the dependent left lower lobe (series 3, image 85). There is unchanged minimal nodularity and scarring of the medial right upper lobe and lateral segment right middle lobe, unchanged from prior. Bronchial wall thickening and scattered bronchial plugging throughout. No significant air trapping.  No pleural effusion or pneumothorax. Upper Abdomen: No acute abnormality. Musculoskeletal: No chest wall mass or suspicious bone lesions identified. Bilateral subpectoral breast implants. IMPRESSION: 1. There is clustered nodularity, consolidation, and mild bronchiectasis of the lingula, which is slightly increased compared to prior examination dated 2018 (series 3, image 71). There are occasional areas of new nodularity, for example in the dependent left lower lobe (series 3, image 85). There is unchanged minimal nodularity and scarring of the medial right upper lobe and lateral segment right middle lobe, unchanged from prior. Bronchial wall thickening and scattered bronchial plugging throughout. Constellation of findings is generally consistent with ongoing atypical infection, particularly atypical mycobacterium. 2.  No evidence of fibrotic interstitial lung disease. Electronically Signed   By: Eddie Candle M.D.   On: 02/25/2019 12:31      Specialty Problems      Pulmonary Problems   Bronchiectasis without complication ([place])    CT scan 04/23/17-  Mild bronchiectasis and mucous plugging in the right middle lobe, lingula. No lymphadenopathy.  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 Chest x-ray 09/25/2018- progressive right upper lobe and left upper lobe  infiltrates.  01/30/2019-resolution of prior right-sided airspace disease, persisting and increased opacity in the left midlung may reflect chronic  changes and scarring including changes related to chronic MAC, however superimposed lobular pneumonia cannot be excluded          No Known Allergies  Immunization History  Administered Date(s) Administered   Influenza-Unspecified 07/06/2017, 07/24/2018   Pneumococcal Conjugate-13 04/13/2014   Pneumococcal Polysaccharide-23 10/13/2018    Past Medical History:  Diagnosis Date   Arthritis    Cancer ([place])    Osteoarthritis     Tobacco History: Social History   Tobacco Use  Smoking Status Never Smoker  Smokeless Tobacco Never Used   Counseling given: Not Answered   Continue to not smoke  Outpatient Encounter Medications as of 02/27/2019  Medication Sig   calcium citrate (CALCITRATE - DOSED IN MG ELEMENTAL CALCIUM) 950 MG tablet Take 200 mg of elemental calcium by mouth daily.   cholecalciferol (VITAMIN D) 1000 units tablet Take 1,000 Units by mouth daily.   citalopram (CELEXA) 40 MG tablet Take 1 tablet (40 mg total) by mouth daily.   dextromethorphan (DELSYM) 30 MG/5ML liquid Take 30 mg by mouth daily as needed for cough.   folic acid (FOLVITE) 1 MG tablet Take 2 tablets (2 mg total) by mouth daily.   hydroxychloroquine (PLAQUENIL) 200 MG tablet TAKE 1 TABLET BY MOUTH  DAILY   levothyroxine (SYNTHROID, LEVOTHROID) 50 MCG tablet Take 50 mcg by mouth daily before breakfast.    methotrexate (RHEUMATREX) 2.5 MG tablet Take 4 tablets (10 mg total) by mouth every Tuesday.   Multiple Vitamin (MULTIVITAMIN) capsule Take 1 capsule by mouth daily.   raloxifene (EVISTA) 60 MG tablet Take 60 mg by mouth daily.    traZODone (DESYREL) 50 MG tablet Take 0.5-1 tablets (25-50 mg total) by mouth at bedtime.   albuterol (PROVENTIL HFA;VENTOLIN HFA) 108 (90 Base) MCG/ACT inhaler Inhale 2 puffs into the lungs every 6 (six) hours  as needed for wheezing or shortness of breath. (Patient not taking: Reported on 02/27/2019)   benzonatate (TESSALON) 100 MG capsule Take 2 capsules (200 mg total) by mouth 2 (two) times daily as needed for cough. (Patient not taking: Reported on 02/27/2019)   meloxicam (MOBIC) 15 MG tablet meloxicam 15 mg tablet  TAKE 1 TABLET BY MOUTH EVERY DAY WITH MEALS   Respiratory Therapy Supplies (FLUTTER) DEVI 10 times Twice a day and prn as needed, may increase if feeling worse   No facility-administered encounter medications on file as of 02/27/2019.      Review of Systems  Review of Systems  Constitutional: Negative for chills, fatigue, fever and unexpected weight change.  HENT: Positive for congestion. Negative for ear pain, sinus pressure and sinus pain.   Respiratory: Positive for cough (dry cough ) and chest tightness. Negative for shortness of breath and wheezing.   Cardiovascular: Negative for chest pain and palpitations.  Gastrointestinal: Negative for diarrhea, nausea and vomiting.  Genitourinary: Negative for dysuria, frequency and urgency.  Musculoskeletal: Negative for arthralgias.  Skin: Negative for color change.  Allergic/Immunologic: Negative for environmental allergies and food allergies.  Neurological: Negative for light-headedness and headaches.  Psychiatric/Behavioral: Negative for dysphoric mood. The patient is not nervous/anxious.   All other systems reviewed and are negative.    Physical Exam  BP 124/72 (BP Location: Left Arm, Cuff Size: Normal)    Pulse 77    Temp 98.3 F (36.8 C)    Ht _0  (1.6 m)    Wt 125 lb 12.8 oz (57.1 kg)    SpO2 96%    BMI 22.28 kg/m   Wt Readings  from Last 5 Encounters:  02/27/19 125 lb 12.8 oz (57.1 kg)  01/30/19 127 lb (57.6 kg)  10/13/18 126 lb (57.2 kg)  09/21/18 128 lb 1.4 oz (58.1 kg)  08/01/18 128 lb 12.8 oz (58.4 kg)    Physical Exam  Constitutional: She is oriented to person, place, and time and well-developed,  well-nourished, and in no distress. No distress.  HENT:  Head: Normocephalic and atraumatic.  Right Ear: Hearing, tympanic membrane, external ear and ear canal normal.  Left Ear: Hearing, tympanic membrane, external ear and ear canal normal.  Nose: Nose normal. Right sinus exhibits no maxillary sinus tenderness and no frontal sinus tenderness. Left sinus exhibits no maxillary sinus tenderness and no frontal sinus tenderness.  Mouth/Throat: Uvula is midline and oropharynx is clear and moist. No oropharyngeal exudate.  Eyes: Pupils are equal, round, and reactive to light.  Neck: Normal range of motion. Neck supple. No JVD present.  Cardiovascular: Normal rate, regular rhythm and normal heart sounds.  Pulmonary/Chest: Effort normal. No accessory muscle usage. No respiratory distress. She has no decreased breath sounds. She has no wheezes. She has no rhonchi. She has no rales.  Scattered squeaks around right middle lobe  Abdominal: Soft. Bowel sounds are normal. She exhibits no distension. There is no abdominal tenderness.  Musculoskeletal: Normal range of motion.        General: No edema.  Lymphadenopathy:    She has no cervical adenopathy.  Neurological: She is alert and oriented to person, place, and time. Gait normal.  Skin: Skin is warm and dry. She is not diaphoretic. No erythema.  Psychiatric: Mood, memory, affect and judgment normal.  Nursing note and vitals reviewed.     Lab Results:  CBC    Component Value Date/Time   WBC 14.6 (H) 09/26/2018 0619   RBC 3.22 (L) 09/26/2018 0619   HGB 10.6 (L) 09/26/2018 0619   HCT 32.4 (L) 09/26/2018 0619   PLT 285 09/26/2018 0619   MCV 100.6 (H) 09/26/2018 0619   MCH 32.9 09/26/2018 0619   MCHC 32.7 09/26/2018 0619   RDW 12.9 09/26/2018 0619   LYMPHSABS 0.7 09/21/2018 2031   MONOABS 1.0 09/21/2018 2031   EOSABS 0.2 09/21/2018 2031   BASOSABS 0.2 (H) 09/21/2018 2031    BMET    Component Value Date/Time   NA 139 09/26/2018 0619   K  3.8 09/26/2018 0619   CL 101 09/26/2018 0619   CO2 29 09/26/2018 0619   GLUCOSE 99 09/26/2018 0619   BUN 10 09/26/2018 0619   CREATININE 0.70 09/26/2018 0619   CREATININE 0.76 08/01/2018 1211   CALCIUM 7.8 (L) 09/26/2018 0619   GFRNONAA >60 09/26/2018 0619   GFRNONAA 75 08/01/2018 1211   GFRAA >60 09/26/2018 0619   GFRAA 86 08/01/2018 1211    BNP    Component Value Date/Time   BNP 812.8 (H) 09/24/2018 0656    ProBNP No results found for: PROBNP    Assessment & Plan:   Bronchiectasis without complication ([place]) Assessment: June/2020 high-res CT chest shows slightly increased bronchiectasis and potential atypical Mycobacterium Previous chest x-ray shows potential chronic MAC  Plan: Sputum culture 3 ways Start Mucinex daily Make sure you are hydrating well and drinking water Restart using flutter valve 10 breaths twice daily Follow-up with our office in 2 months  Rheumatoid arthritis, seronegative, multiple sites [facility]) Plan: Continue Plaquenil Continue follow-up with rheumatology    Return in about 2 months (around 04/29/2019), or if symptoms worsen or fail to improve, for  Follow up with Dr. Vaughan Browner.   Lauraine Rinne, NP 02/27/2019   This appointment was 26 minutes long with over 50% of the time in direct face-to-face patient care, assessment, plan of care, and follow-up.

## 2019-02-26 NOTE — Telephone Encounter (Signed)
Patient has already been contacted.  Patient knows to keep the appointment 02/27/2019 with me.  Lauren spoke with the patient earlier today.  Nothing further is needed.  [name], FNP

## 2019-02-27 ENCOUNTER — Encounter: Payer: Self-pay | Admitting: Pulmonary Disease

## 2019-02-27 ENCOUNTER — Ambulatory Visit: Payer: Medicare Other | Admitting: Pulmonary Disease

## 2019-02-27 ENCOUNTER — Other Ambulatory Visit: Payer: Self-pay

## 2019-02-27 VITALS — BP 124/72 | HR 77 | Temp 98.3°F | Ht 63.0 in | Wt 125.8 lb

## 2019-02-27 DIAGNOSIS — R05 Cough: Secondary | ICD-10-CM | POA: Diagnosis not present

## 2019-02-27 DIAGNOSIS — M0609 Rheumatoid arthritis without rheumatoid factor, multiple sites: Secondary | ICD-10-CM

## 2019-02-27 DIAGNOSIS — J479 Bronchiectasis, uncomplicated: Secondary | ICD-10-CM

## 2019-02-27 DIAGNOSIS — R059 Cough, unspecified: Secondary | ICD-10-CM

## 2019-02-27 MED ORDER — FLUTTER DEVI: 0 refills | Status: DC

## 2019-02-27 NOTE — Assessment & Plan Note (Addendum)
Assessment: June/2020 high-res CT chest shows slightly increased bronchiectasis and potential atypical Mycobacterium Previous chest x-ray shows potential chronic MAC  Plan: Sputum culture 3 ways Start Mucinex daily Make sure you are hydrating well and drinking water Restart using flutter valve 10 breaths twice daily Follow-up with our office in 2 months

## 2019-02-27 NOTE — Assessment & Plan Note (Signed)
Plan: Continue Plaquenil Continue follow-up with rheumatology

## 2019-02-27 NOTE — Patient Instructions (Addendum)
Enjoy your beach vacation!  Enjoy spending time with family!  Sputum culture 3 ways   Start mucinex   Bronchiectasis: This is the medical term which indicates that you have damage, dilated airways making you more susceptible to respiratory infection. Start: Use a flutter valve 10 breaths twice a day or 4 to 5 breaths 4-5 times a day to help clear mucus out Let us know if you have cough with change in mucus color or fevers or chills.  At that point you would need an antibiotic. Maintain a healthy nutritious diet, eating whole foods Take your medications as prescribed    Return in about 2 months (around 04/29/2019), or if symptoms worsen or fail to improve, for Follow up with Dr. Vaughan Browner.    Coronavirus (COVID-19) Are you at risk?  Are you at risk for the Coronavirus (COVID-19)?  To be considered HIGH RISK for Coronavirus (COVID-19), you have to meet the following criteria:  . Traveled to [place], [place], [place], [place] or [place]; or in the [place] to [place], [place], [place], or [place]; and have fever, cough, and shortness of breath within the last 2 weeks of travel OR . Been in close contact with a person diagnosed with COVID-19 within the last 2 weeks and have fever, cough, and shortness of breath . IF YOU DO NOT MEET THESE CRITERIA, YOU ARE CONSIDERED LOW RISK FOR COVID-19.  What to do if you are HIGH RISK for COVID-19?  Marland Kitchen If you are having a medical emergency, call 911. . Seek medical care right away. Before you go to a doctor's office, urgent care or emergency department, call ahead and tell them about your recent travel, contact with someone diagnosed with COVID-19, and your symptoms. You should receive instructions from your physician's office regarding next steps of care.  . When you arrive at healthcare provider, tell the healthcare staff immediately you have returned from visiting [place], [place], [place], [place] or [place]; or traveled in the [place] to  [place], [place], [place], or [place]; in the last two weeks or you have been in close contact with a person diagnosed with COVID-19 in the last 2 weeks.   . Tell the health care staff about your symptoms: fever, cough and shortness of breath. . After you have been seen by a medical provider, you will be either: o Tested for (COVID-19) and discharged home on quarantine except to seek medical care if symptoms worsen, and asked to  - Stay home and avoid contact with others until you get your results (4-5 days)  - Avoid travel on public transportation if possible (such as bus, train, or airplane) or o Sent to the Emergency Department by EMS for evaluation, COVID-19 testing, and possible admission depending on your condition and test results.  What to do if you are LOW RISK for COVID-19?  Reduce your risk of any infection by using the same precautions used for avoiding the common cold or flu:  Marland Kitchen Wash your hands often with soap and warm water for at least 20 seconds.  If soap and water are not readily available, use an alcohol-based hand sanitizer with at least 60% alcohol.  . If coughing or sneezing, cover your mouth and nose by coughing or sneezing into the elbow areas of your shirt or coat, into a tissue or into your sleeve (not your hands). . Avoid shaking hands with others and consider head nods or verbal greetings only. . Avoid touching your  eyes, nose, or mouth with unwashed hands.  . Avoid close contact with people who are sick. . Avoid places or events with large numbers of people in one location, like concerts or sporting events. . Carefully consider travel plans you have or are making. . If you are planning any travel outside or inside the [place], visit the CDC's Travelers' Health webpage for the latest health notices. . If you have some symptoms but not all symptoms, continue to monitor at home and seek medical attention if your symptoms worsen. . If you are having a medical  emergency, call 911.   [place] / e-Visit: [email or web address]         [facility]: [facility]: [phone]                   [facility]: [phone]           It is flu season:   >>> Best ways to protect herself from the flu: Receive the yearly flu vaccine, practice good hand hygiene washing with soap and also using hand sanitizer when available, eat a nutritious meals, get adequate rest, hydrate appropriately   Please contact the office if your symptoms worsen or you have concerns that you are not improving.   Thank you for choosing [facility] for your healthcare, and for allowing [place] to partner with you on your healthcare journey. I am thankful to be able to provide care to you today.   [name] FNP-C    Bronchiectasis  Bronchiectasis is a condition in which the airways in the lungs (bronchi) are damaged and widened. The condition makes it hard for the lungs to get rid of mucus, and it causes mucus to gather in the bronchi. This condition often leads to lung infections, which can make the condition worse. What are the causes? You can be born with this condition or you can develop it later in life. Common causes of this condition include:  Cystic fibrosis.  Repeated lung infections, such as pneumonia or tuberculosis.  An object or other blockage in the lungs.  Breathing in fluid, food, or other objects (aspiration).  A problem with the immune system and lung structure that is present at birth (congenital). Sometimes the cause is not known. What are the signs or symptoms? Common symptoms of this condition include:  A daily cough that brings up mucus and lasts for more than 3 weeks.  Lung infections that happen often.  Shortness of breath and wheezing.  Weakness and fatigue. How is this  diagnosed? This condition is diagnosed with tests, such as:  Chest X-rays or CT scans. These are done to check for changes in the lungs.  Breathing tests. These are done to check how well your lungs are working.  A test of a sample of your saliva (sputum culture). This test is done to check for infection.  Blood tests and other tests. These are done to check for related diseases or causes. How is this treated? Treatment for this condition depends on the severity of the illness and its cause. Treatment may include:  Medicines that loosen mucus so it can be coughed up (expectorants).  Medicines that relax the muscles of the bronchi (bronchodilators).  Antibiotic medicines to prevent or treat infection.  Physical therapy to help clear mucus from the lungs. Techniques may include: ? Postural drainage. This is when you sit or lie in  certain positions so that mucus can drain by gravity. ? Chest percussion. This involves tapping the chest or back with a cupped hand. ? Chest vibration. For this therapy, a hand or special equipment vibrates your chest and back.  Surgery to remove the affected part of the lung. This may be done in severe cases. Follow these instructions at home: Medicines  Take over-the-counter and prescription medicines only as told by your health care provider.  If you were prescribed an antibiotic medicine, take it as told by your health care provider. Do not stop taking the antibiotic even if you start to feel better.  Avoid taking sedatives and antihistamines unless your health care provider tells you to take them. These medicines tend to thicken the mucus in the lungs. Managing symptoms  Perform breathing exercises or techniques to clear your lungs as told by your health care provider.  Consider using a cold steam vaporizer or humidifier in your room or home to help loosen secretions.  If you have a cough that gets worse at night, try sleeping in a semi-upright  position. General instructions  Get plenty of rest.  Drink enough fluid to keep your urine clear or pale yellow.  Stay inside when pollution and ozone levels are high.  Stay up to date with vaccinations and immunizations.  Avoid cigarette smoke and other lung irritants.  Do not use any products that contain nicotine or tobacco, such as cigarettes and e-cigarettes. If you need help quitting, ask your health care provider.  Keep all follow-up visits as told by your health care provider. This is important. Contact a health care provider if:  You cough up more sputum than before and the sputum is yellow or green in color.  You have a fever.  You cannot control your cough and are losing sleep. Get help right away if:  You cough up blood.  You have chest pain.  You have increasing shortness of breath.  You have pain that gets worse or is not controlled with medicines.  You have a fever and your symptoms suddenly get worse. Summary  Bronchiectasis is a condition in which the airways in the lungs (bronchi) are damaged and widened. The condition makes it hard for the lungs to get rid of mucus, and it causes mucus to gather in the bronchi.  Treatment usually includes therapy to help clear mucus from the lungs.  Stay up to date with vaccinations and immunizations. This information is not intended to replace advice given to you by your health care provider. Make sure you discuss any questions you have with your health care provider. Document Released: 07/01/2007 Document Revised: 10/08/2016 Document Reviewed: 10/08/2016 Elsevier Interactive Patient Education  2019 [employer].

## 2019-03-17 ENCOUNTER — Other Ambulatory Visit: Payer: Self-pay | Admitting: Rheumatology

## 2019-03-17 DIAGNOSIS — M0609 Rheumatoid arthritis without rheumatoid factor, multiple sites: Secondary | ICD-10-CM

## 2019-03-17 NOTE — Telephone Encounter (Addendum)
Last Visit: 01/02/19 Next Visit: 04/07/19 Labs: 09/26/18 Calcium 7.8 WBC 14.6 RBC 3.22, Hgb 10.6 Hct 32.4 MCV 100.6 PLQ Eye Exam 12/24/17 WNL  Left message to advise patient she is due to update PLQ eye exam  Okay to refill 30 day supply per Dr. Estanislado Pandy

## 2019-03-18 ENCOUNTER — Other Ambulatory Visit: Payer: Self-pay | Admitting: Physician Assistant

## 2019-03-26 NOTE — Progress Notes (Signed)
Office Visit Note  Patient: Megan Porter             Date of Birth: May 11, 1941           MRN: [id number]             PCP: Haywood Pao, MD Referring: Haywood Pao, MD Visit Date: 04/07/2019 Occupation: @GUAROCC @  Subjective:  Pain in joints.    History of Present Illness: Megan Porter is a 78 y.o. female history of rheumatoid arthritis, osteoarthritis and degenerative disc disease.  She states she had 2 episodes of pneumonia in the last year.  In April she had to come off the methotrexate due to frequent infections.  She has been on Plaquenil monotherapy now.  She states that she continues to have some swelling over her left ankle joint but is not swollen.  None of the other joints are painful.  She states she does have ongoing lower back pain for which she was seen by Dr. Maureen Ralphs who advised cortisone injection to her lower back.  She decided not to have cortisone injections at this point.  She states she has been experiencing some nocturnal pain which goes into her bilateral lower extremities.  Activities of Daily Living:  Patient reports morning stiffness for 15 minutes.   Patient Reports nocturnal pain.  Difficulty dressing/grooming: Denies Difficulty climbing stairs: Denies Difficulty getting out of chair: Denies Difficulty using hands for taps, buttons, cutlery, and/or writing: Reports  Review of Systems  Constitutional: Positive for fatigue. Negative for night sweats, weight gain and weight loss.  HENT: Negative for mouth sores, trouble swallowing, trouble swallowing, mouth dryness and nose dryness.   Eyes: Negative for pain, redness, itching, visual disturbance and dryness.  Respiratory: Negative for cough, shortness of breath and difficulty breathing.   Cardiovascular: Negative for chest pain, palpitations, hypertension, irregular heartbeat and swelling in legs/feet.  Gastrointestinal: Negative for abdominal pain, blood in stool, constipation and diarrhea.   Endocrine: Negative for increased urination.  Genitourinary: Negative for painful urination, pelvic pain and vaginal dryness.  Musculoskeletal: Positive for joint swelling and morning stiffness. Negative for arthralgias, joint pain, myalgias, muscle weakness, muscle tenderness and myalgias.  Skin: Negative for color change, rash, hair loss, redness, skin tightness, ulcers and sensitivity to sunlight.  Allergic/Immunologic: Negative for susceptible to infections.  Neurological: Negative for dizziness, headaches, memory loss, night sweats and weakness.  Hematological: Negative for swollen glands.  Psychiatric/Behavioral: Negative for depressed mood, confusion and sleep disturbance. The patient is not nervous/anxious.     PMFS History:  Patient Active Problem List   Diagnosis Date Noted  . Hypokalemia 09/21/2018  . Benign essential HTN 09/21/2018  . Leucocytosis 09/21/2018  . Pneumonia 09/21/2018  . Bronchiectasis without complication ([place]) 40/04/6760  . Primary osteoarthritis of both knees 04/06/2017  . DDD (degenerative disc disease), lumbar history of lumbar surgery 1970 04/06/2017  . Age-related osteoporosis without current pathological fracture 04/06/2017  . History of anxiety 04/06/2017  . History of breast cancer 04/04/2017  . Primary osteoarthritis of both feet 10/02/2016  . Primary osteoarthritis of both hands 10/02/2016  . High risk medications (not anticoagulants) long-term use 10/02/2016  . Rheumatoid arthritis, seronegative, multiple sites ([place]) 10/02/2016    Past Medical History:  Diagnosis Date  . Arthritis   . Cancer ([place])   . Osteoarthritis     Family History  Family history unknown: Yes   Past Surgical History:  Procedure Laterality Date  . BACK SURGERY    . BREAST RECONSTRUCTION  Social History   Social History Narrative  . Not on file   Immunization History  Administered Date(s) Administered  . Influenza-Unspecified 07/06/2017, 07/24/2018  .  Pneumococcal Conjugate-13 04/13/2014  . Pneumococcal Polysaccharide-23 10/13/2018     Objective: Vital Signs: BP 124/66 (BP Location: Left Arm, Patient Position: Sitting, Cuff Size: Normal)   Pulse 79   Resp 12   Ht 5\' 3"  (1.6 m)   Wt 125 lb 9.6 oz (57 kg)   BMI 22.25 kg/m    Physical Exam Vitals signs and nursing note reviewed.  Constitutional:      Appearance: She is well-developed.  HENT:     Head: Normocephalic and atraumatic.  Eyes:     Conjunctiva/sclera: Conjunctivae normal.  Neck:     Musculoskeletal: Normal range of motion.  Cardiovascular:     Rate and Rhythm: Normal rate and regular rhythm.     Heart sounds: Normal heart sounds.  Pulmonary:     Effort: Pulmonary effort is normal.     Breath sounds: Normal breath sounds.  Abdominal:     General: Bowel sounds are normal.     Palpations: Abdomen is soft.  Lymphadenopathy:     Cervical: No cervical adenopathy.  Skin:    General: Skin is warm and dry.     Capillary Refill: Capillary refill takes less than 2 seconds.  Neurological:     Mental Status: She is alert and oriented to person, place, and time.  Psychiatric:        Behavior: Behavior normal.      Musculoskeletal Exam: C-spine good range of motion.  She has limited painful range of motion of lumbar spine.  Shoulder joints elbow joints with good range of motion.  She has no synovitis of her MCPs.  Bilateral CMC PIP and DIP thickening was noted.  Hip joints and knee joints in good range of motion.  No synovitis or swelling was noted over ankle joints.  She has some osteoarthritic changes in her feet.  CDAI Exam: CDAI Score: 0.7  Patient Global: 5 mm; Provider Global: 2 mm Swollen: 0 ; Tender: 0  Joint Exam   No joint exam has been documented for this visit     Investigation: No additional findings.  Imaging: No results found.  Recent Labs: Lab Results  Component Value Date   WBC 14.6 (H) 09/26/2018   HGB 10.6 (L) 09/26/2018   PLT 285  09/26/2018   NA 139 09/26/2018   K 3.8 09/26/2018   CL 101 09/26/2018   CO2 29 09/26/2018   GLUCOSE 99 09/26/2018   BUN 10 09/26/2018   CREATININE 0.70 09/26/2018   BILITOT 0.6 09/25/2018   ALKPHOS 70 09/25/2018   AST 18 09/25/2018   ALT 30 09/25/2018   PROT 5.1 (L) 09/25/2018   ALBUMIN 1.8 (L) 09/25/2018   CALCIUM 7.8 (L) 09/26/2018   GFRAA >60 09/26/2018    Speciality Comments: PLQ Eye Exam 12/24/17 WNL @ [facility] follow up in 1 year  Procedures:  No procedures performed Allergies: Patient has no known allergies.   Assessment / Plan:     Visit Diagnoses: Rheumatoid arthritis, seronegative, multiple sites ([place]) - Erosive disease neg RF,CCP, and ANA -patient had no synovitis on examination.  She is on Plaquenil monotherapy.  She states she discontinued methotrexate in January per her doctor's recommendations due to frequent upper respiratory tract infections.  She states she had pneumonia twice in the last year.  High risk medications (not anticoagulants) long-term use - Plaquenil  200 mg 1 tablet by mouth daily. D/c MTX in January 2020 due to recurrent infections-pneumonia x2 in 1 yr.Eye Exam 12/24/17 - Plan: CBC with Differential/Platelet, COMPLETE METABOLIC PANEL WITH GFR, CBC with Differential/Platelet, COMPLETE METABOLIC PANEL WITH GFR, patient will get her eye examination soon.  Primary osteoarthritis of both hands -joint protection was discussed.  Primary osteoarthritis of both knees -she is currently not having much discomfort.  Primary osteoarthritis of both feet -no synovitis was noted.  Proper fitting shoes were discussed.  DDD (degenerative disc disease), lumbar history of lumbar surgery 1970 -she complains of lower back pain and bilateral lower extremity radiculopathy.  She will follow-up with orthopedics regarding possible injections.  Age-related osteoporosis without current pathological fracture - She is on Evista -I do not have the details of her DEXA  scan.  History of breast cancer   History of anxiety   Orders: Orders Placed This Encounter  Procedures  . CBC with Differential/Platelet  . COMPLETE METABOLIC PANEL WITH GFR  . CBC with Differential/Platelet  . COMPLETE METABOLIC PANEL WITH GFR   No orders of the defined types were placed in this encounter.    Follow-Up Instructions: Return in about 5 months (around 09/07/2019) for Rheumatoid arthritis.   Bo Merino, MD  Note - This record has been created using Editor, commissioning.  Chart creation errors have been sought, but may not always  have been located. Such creation errors do not reflect on  the standard of medical care.

## 2019-04-07 ENCOUNTER — Other Ambulatory Visit: Payer: Self-pay

## 2019-04-07 ENCOUNTER — Ambulatory Visit (INDEPENDENT_AMBULATORY_CARE_PROVIDER_SITE_OTHER): Payer: Medicare Other | Admitting: Rheumatology

## 2019-04-07 ENCOUNTER — Encounter: Payer: Self-pay | Admitting: Rheumatology

## 2019-04-07 VITALS — BP 124/66 | HR 79 | Resp 12 | Ht 63.0 in | Wt 125.6 lb

## 2019-04-07 DIAGNOSIS — M170 Bilateral primary osteoarthritis of knee: Secondary | ICD-10-CM

## 2019-04-07 DIAGNOSIS — M19041 Primary osteoarthritis, right hand: Secondary | ICD-10-CM | POA: Diagnosis not present

## 2019-04-07 DIAGNOSIS — M0609 Rheumatoid arthritis without rheumatoid factor, multiple sites: Secondary | ICD-10-CM | POA: Diagnosis not present

## 2019-04-07 DIAGNOSIS — Z853 Personal history of malignant neoplasm of breast: Secondary | ICD-10-CM

## 2019-04-07 DIAGNOSIS — M810 Age-related osteoporosis without current pathological fracture: Secondary | ICD-10-CM

## 2019-04-07 DIAGNOSIS — Z79899 Other long term (current) drug therapy: Secondary | ICD-10-CM | POA: Diagnosis not present

## 2019-04-07 DIAGNOSIS — M19042 Primary osteoarthritis, left hand: Secondary | ICD-10-CM

## 2019-04-07 DIAGNOSIS — Z8659 Personal history of other mental and behavioral disorders: Secondary | ICD-10-CM

## 2019-04-07 DIAGNOSIS — M19071 Primary osteoarthritis, right ankle and foot: Secondary | ICD-10-CM

## 2019-04-07 DIAGNOSIS — M19072 Primary osteoarthritis, left ankle and foot: Secondary | ICD-10-CM

## 2019-04-07 DIAGNOSIS — M5136 Other intervertebral disc degeneration, lumbar region: Secondary | ICD-10-CM

## 2019-04-07 NOTE — Patient Instructions (Addendum)
Standing Labs We placed an order today for your standing lab work.    Please come back and get your standing labs in October and every 5 months   We have open lab daily Monday through Thursday from 8:30-12:30 PM and 1:30-4:30 PM and Friday from 8:30-12:30 PM and 1:30 -4:00 PM at the office of Dr. Tyri Elmore.   You may experience shorter wait times on Monday and Friday afternoons. The office is located at [address], [address], [place], [place] [address] No appointment is necessary.   Labs are drawn by [facility].  You may receive a bill from [facility] for your lab work.  If you wish to have your labs drawn at another location, please call the office 24 hours in advance to send orders.  If you have any questions regarding directions or hours of operation,  please call [phone].   Just as a reminder please drink plenty of water prior to coming for your lab work. Thanks!   

## 2019-04-08 ENCOUNTER — Telehealth: Payer: Self-pay | Admitting: Rheumatology

## 2019-04-08 LAB — COMPLETE METABOLIC PANEL WITH GFR
AG Ratio: 1.5 (calc) (ref 1.0–2.5)
ALT: 13 U/L (ref 6–29)
AST: 19 U/L (ref 10–35)
Albumin: 4 g/dL (ref 3.6–5.1)
Alkaline phosphatase (APISO): 55 U/L (ref 37–153)
BUN: 15 mg/dL (ref 7–25)
CO2: 31 mmol/L (ref 20–32)
Calcium: 9.1 mg/dL (ref 8.6–10.4)
Chloride: 104 mmol/L (ref 98–110)
Creat: 0.85 mg/dL (ref 0.60–0.88)
GFR, Est African American: 75 mL/min/{1.73_m2} (ref >=60)
GFR, Est Non African American: 65 mL/min/{1.73_m2} (ref >=60)
Globulin: 2.6 g/dL (calc) (ref 1.9–3.7)
Glucose, Bld: 113 mg/dL — ABNORMAL HIGH (ref 65–99)
Potassium: 4.1 mmol/L (ref 3.5–5.3)
Sodium: 142 mmol/L (ref 135–146)
Total Bilirubin: 0.4 mg/dL (ref 0.2–1.2)
Total Protein: 6.6 g/dL (ref 6.1–8.1)

## 2019-04-08 LAB — CBC WITH DIFFERENTIAL/PLATELET
Absolute Monocytes: 406 cells/uL (ref 200–950)
Basophils Absolute: 41 cells/uL (ref 0–200)
Basophils Relative: 0.7 %
Eosinophils Absolute: 122 cells/uL (ref 15–500)
Eosinophils Relative: 2.1 %
HCT: 36.9 % (ref 35.0–45.0)
Hemoglobin: 12.4 g/dL (ref 11.7–15.5)
Lymphs Abs: 986 cells/uL (ref 850–3900)
MCH: 32.5 pg (ref 27.0–33.0)
MCHC: 33.6 g/dL (ref 32.0–36.0)
MCV: 96.9 fL (ref 80.0–100.0)
MPV: 10.6 fL (ref 7.5–12.5)
Monocytes Relative: 7 %
Neutro Abs: 4246 cells/uL (ref 1500–7800)
Neutrophils Relative %: 73.2 %
Platelets: 208 10*3/uL (ref 140–400)
RBC: 3.81 10*6/uL (ref 3.80–5.10)
RDW: 12 % (ref 11.0–15.0)
Total Lymphocyte: 17 %
WBC: 5.8 10*3/uL (ref 3.8–10.8)

## 2019-04-08 NOTE — Telephone Encounter (Signed)
PLQ eye exam form faxed to eye doctor.

## 2019-04-08 NOTE — Telephone Encounter (Signed)
Patient going to Dr. Loretta Plume today for Plaquenil Eye Exam ,and left Form for him to fill out here. Patient request a form to be faxed to Dr. Loretta Plume.

## 2019-05-29 ENCOUNTER — Telehealth: Payer: Self-pay | Admitting: Pulmonary Disease

## 2019-05-29 ENCOUNTER — Other Ambulatory Visit: Payer: Self-pay

## 2019-05-29 ENCOUNTER — Ambulatory Visit: Payer: Medicare Other | Admitting: Pulmonary Disease

## 2019-05-29 ENCOUNTER — Encounter: Payer: Self-pay | Admitting: Pulmonary Disease

## 2019-05-29 VITALS — BP 118/76 | HR 74 | Temp 98.4°F | Ht 63.0 in | Wt 125.0 lb

## 2019-05-29 DIAGNOSIS — J479 Bronchiectasis, uncomplicated: Secondary | ICD-10-CM | POA: Diagnosis not present

## 2019-05-29 DIAGNOSIS — M0609 Rheumatoid arthritis without rheumatoid factor, multiple sites: Secondary | ICD-10-CM | POA: Diagnosis not present

## 2019-05-29 DIAGNOSIS — Z23 Encounter for immunization: Secondary | ICD-10-CM | POA: Diagnosis not present

## 2019-05-29 MED ORDER — BUDESONIDE-FORMOTEROL FUMARATE 80-4.5 MCG/ACT IN AERO: 2.0000 | INHALATION_SPRAY | Freq: Two times a day (BID) | RESPIRATORY_TRACT | 12 refills | Status: DC

## 2019-05-29 NOTE — Addendum Note (Signed)
Addended by: Hildred Alamin I on: 05/29/2019 11:27 AM   Modules accepted: Orders

## 2019-05-29 NOTE — Progress Notes (Signed)
Megan Porter    [id number]    01-30-1941  Primary Care Physician:Tisovec, Fransico Him, MD  Referring Physician: Haywood Pao, MD [address] [place],  [place] [address]  Chief complaint:   Follow-up for bronchiectasis, pneumonia   HPI: Megan Porter is a 78 year old with rheumatoid arthritis, cancer.She was hospitalized in May 2018 at [facility] for respiratory failure with multilobar infiltrates on CT scan. Flu test, Legionella urine test was negative.She was treated with a prednisone taper and Levaquin. She apparently had drug reaction with hives to Levaquin and it was changed to a different antibiotics. Since her return to [facility] she's had a follow-up CT scan earlier this month which showed resolution of the infiltrates.There is some mild bronchiectasis with mucus plugging in the right middle lobe. She has been referred to pulmonary for further evaluation.  Hospitalized in early January 2020 for acute hypoxic respiratory failure secondary to pneumonia with chest x-ray showing right upper lobe infiltrate. Work-up including urine pneumococcus, respiratory virus panel and blood cultures were negative.   She has history of rheumatoid arthritis and is followed by Dr. Estanislado Pandy. She was being maintained on methotrexate and Plaquenil.  Since her hospitalization she was taken off methotrexate  Pets:No pets. She used to have a dog. She does not have any birds, exotic pets or exposure to farm animals Occupation:Homemaker Exposures:No known exposures. No mold issues at home. Smoking history:Never smoker  Interim history: Continues to have daily symptoms of cough, chest congestion We had tried to get sputum for culture and evaluation of MAI but she is unable to get a specimen Denies any fevers, chills  Continues using the flutter valve.  Outpatient Encounter Medications as of 05/29/2019  Medication Sig  . calcium citrate (CALCITRATE - DOSED IN MG ELEMENTAL CALCIUM) 950 MG  tablet Take 200 mg of elemental calcium by mouth daily.  . cholecalciferol (VITAMIN D) 1000 units tablet Take 1,000 Units by mouth daily.  . citalopram (CELEXA) 40 MG tablet TAKE 1 TABLET BY MOUTH  DAILY  . hydroxychloroquine (PLAQUENIL) 200 MG tablet TAKE 1 TABLET BY MOUTH  DAILY  . levothyroxine (SYNTHROID, LEVOTHROID) 50 MCG tablet Take 50 mcg by mouth daily before breakfast.   . Multiple Vitamin (MULTIVITAMIN) capsule Take 1 capsule by mouth daily.  . raloxifene (EVISTA) 60 MG tablet Take 60 mg by mouth daily.   Marland Kitchen Respiratory Therapy Supplies (FLUTTER) DEVI 10 times Twice a day and prn as needed, may increase if feeling worse  . traZODone (DESYREL) 50 MG tablet Take 0.5-1 tablets (25-50 mg total) by mouth at bedtime.   No facility-administered encounter medications on file as of 05/29/2019.    Physical Exam: Blood pressure 118/76, pulse 74, temperature 98.4 F (36.9 C), temperature source Temporal, height _0  (1.6 m), weight 125 lb (56.7 kg), SpO2 95 %. Gen:      No acute distress HEENT:  EOMI, sclera anicteric Neck:     No masses; no thyromegaly Lungs:    Clear to auscultation bilaterally; normal respiratory effort CV:         Regular rate and rhythm; no murmurs Abd:      + bowel sounds; soft, non-tender; no palpable masses, no distension Ext:    No edema; adequate peripheral perfusion Skin:      Warm and dry; no rash Neuro: alert and oriented x 3 Psych: normal mood and affect  Data Reviewed: Imaging: DATA from [facility] Chest x-ray 12/16/16- left upper lobe consolidation,  right mid lung consolidation CT chest 12/18/16- Small to moderate bilateral pleural effusion, dense alveolar infiltrate throughout the left upper lobe, right middle lobe and left lower lobe with underlying nodularity. Mediastinal lymphadenopathy.  CT scan 04/23/17-  Mild bronchiectasis and mucous plugging in the right middle lobe, lingula. No lymphadenopathy.  Chest x-ray 09/21/2018- right upper lobe airspace  opacity. Chest x-ray 09/25/2018- progressive right upper lobe and left upper lobe infiltrates. High-resolution CT chest 02/25/2019- nodularity, consolidation and bronchiectasis in the lingula which is worsened since 2018.  New nodularity in the left lower lobe.  Bronchial wall thickening and scattered bronchial plugging throughout the lungs.  No evidence of pulmonary fibrosis I have reviewed the images personally.  PFTs 08/06/17 FVC 1.82 [73%], FEV1 1.32 [71%], F/F 73, TLC 67%, DLCO 70% Mild obstruction, moderate restriction, mild diffusion defect  Assessment:  Bronchiectasis She has recurrent episodes of pneumonia with CT scan showing bronchiectasis, mucous plugging, nodularity Concern for atypical mycobacterial infection.  Since she is not able to bring up sputum we will schedule bronchoscopy with BAL for further evaluation  Start Symbicort Continue Mucinex, flutter valve Order percussion vest for mucociliary clearance.  Rheumatoid arthritis Bronchiectasis could be a manifestation of rheumatoid arthritis Currently on Plaquenil.  Methotrexate on hold since early 2020 due to recurrent pneumonias. Follows with Dr. Estanislado Pandy  Health maintenance Flu vaccine today 04/13/2014-Prevnar 10/13/2018-Pneumovax  Plan/Recommendations: - Schedule bronchoscopy - Start Symbicort - Mucinex, flutter valve - Order percussion vest.  Marshell Garfinkel MD [facility] 05/29/2019, 10:45 AM  CC: Tisovec, Fransico Him, MD

## 2019-05-29 NOTE — Telephone Encounter (Signed)
Call returned to patient, she states she is going to have a bronchoscopy next week  and wanted to know if there was going to be any down time. I made her aware typically most patients go home the same day pending any complications. I made her aware she will need someone with her to drive due to the sedation that is required for the procedure. Voiced understanding. Nothing further needed at this time.

## 2019-05-29 NOTE — Patient Instructions (Signed)
Sorry that you are having persistent cough with chest congestion We will start you on a Symbicort 80 inhaler.  Use 2 puffs twice daily Continue using the Mucinex and flutter valve We will put in an order for chest vest  Since you are not making sputum we will schedule you for a bronchoscopy to get specimens for culture I will get back in touch with you early next week with the time and date of procedure We will give a flu shot today  Follow-up in 1 month.

## 2019-06-02 ENCOUNTER — Telehealth: Payer: Self-pay

## 2019-06-02 NOTE — Telephone Encounter (Signed)
Pt decided not to leave a message 

## 2019-06-10 ENCOUNTER — Telehealth: Payer: Self-pay | Admitting: Pulmonary Disease

## 2019-06-11 NOTE — Telephone Encounter (Signed)
Dr. Vaughan Browner pt inquiring as to when bronch will be scheduled. She will be out of town all of next week.  Assessment:  Bronchiectasis She has recurrent episodes of pneumonia with CT scan showing bronchiectasis, mucous plugging, nodularity Concern for atypical mycobacterial infection.  Since she is not able to bring up sputum we will schedule bronchoscopy with BAL for further evaluation  Start Symbicort Continue Mucinex, flutter valve Order percussion vest for mucociliary clearance.  Rheumatoid arthritis Bronchiectasis could be a manifestation of rheumatoid arthritis Currently on Plaquenil.  Methotrexate on hold since early 2020 due to recurrent pneumonias. Follows with Dr. Estanislado Pandy  Health maintenance Flu vaccine today 04/13/2014-Prevnar 10/13/2018-Pneumovax  Plan/Recommendations: - Schedule bronchoscopy - Start Symbicort - Mucinex, flutter valve - Order percussion vest.  Marshell Garfinkel MD [facility] 05/29/2019, 10:45 AM

## 2019-06-12 NOTE — Telephone Encounter (Signed)
Tentatively scheduled for 10/6 am at [facility]. We will get in touch with her with definite time next week

## 2019-06-12 NOTE — Telephone Encounter (Signed)
Called and spoke with patient. Let her know the recommendation from Dr. Vaughan Browner . Patient states she wrote it in her calendar and will wait for a call next week to confirm final time and date.   Will route to [place] to follow up on next week

## 2019-06-15 NOTE — Telephone Encounter (Signed)
Call made to patient, confirmed DOB. I confirmed with the patient her Bronchoscopy is scheduled for 10/06 at 730am. She will need to arrive at [facility] by [place] someone will call her to schedule Covid testing. Voiced understanding.   Will route message to [facility] as she will need covid testing prior to bronch.

## 2019-06-15 NOTE — Telephone Encounter (Signed)
Attempted to call patient to schedule pre-procedure covid testing, no answer, left message to call back.

## 2019-06-16 NOTE — Telephone Encounter (Signed)
Pt calling wanting to speak with a nurse to see about changing her date for her procedure could a nurse call her please

## 2019-06-16 NOTE — Telephone Encounter (Signed)
Called and spoke to patient. Patient needs to cancel her upcoming bronch.  [place].  Patient stated she can do anything October 12 th on.   Dr. Vaughan Browner please advise on you avialablity for rescheduling. Thank you!

## 2019-06-16 NOTE — Telephone Encounter (Signed)
Burman Nieves can you call pt back?

## 2019-06-17 NOTE — Telephone Encounter (Signed)
Called and spoke to patient. Let her know that we are still waiting on Dr. Vaughan Browner to give available dates to reschedule.  I attempted to call over to the hospital multiple times to let them know patient is rescheduling for another time and only got voicemails. Procedure is canceled in [place].

## 2019-06-17 NOTE — Telephone Encounter (Signed)
Pt calling back about canceling the procedure for 10/6.  Stated it had not been cancelled and received a call from the hospital.  Pt out of town.

## 2019-06-17 NOTE — Telephone Encounter (Signed)
Pt will be back in town by 10/6.

## 2019-06-18 ENCOUNTER — Other Ambulatory Visit: Payer: Self-pay | Admitting: Pulmonary Disease

## 2019-06-18 NOTE — Telephone Encounter (Signed)
Rescheduled on 19th October, 9 AM at [facility].  Please arrive 2 hours before procedure Order COVID test on the 16 Oct

## 2019-06-18 NOTE — Telephone Encounter (Signed)
Called spoke with patient and discussed her new bronchoscopy date of Monday 10.19.2020, 9am @ [facility]; arrive 2 hours prior to procedure.  Patient is aware she will be contacted the Friday before with the specifics.  Patient's COVID test has been scheduled for 10.16.2020 @ 1005 at [facility].  Patient is aware to stay in the pre-procedure lane (right lane) and to strictly quarantine after she receives the COVID test.  Nothing further needed at this time; will sign and route back to Dr Vaughan Browner to make him aware that pt knows new procedure date and COVID test has been scheduled.

## 2019-06-18 NOTE — Telephone Encounter (Signed)
She is still scheduled for 10/6. We have not cancelled it yet. If she is back in town then we will keep the date for procedure.

## 2019-06-18 NOTE — Telephone Encounter (Signed)
Pt returning call.  [phone].

## 2019-06-18 NOTE — Telephone Encounter (Signed)
Called to see if she can still do 10/6 - LMTCB

## 2019-06-18 NOTE — Telephone Encounter (Signed)
Dr. Vaughan Browner would you like to reschedule this patient she will be back in town on 10/6, If there are dates that work for you please let us know and we can see which of those dates works for you also works for the patient. To get her rescheduled.

## 2019-06-18 NOTE — Telephone Encounter (Signed)
Spoke with the pt  She will be in town on 06/23/19 but this will be her first day back so she won't be here to be able to do the covid test 3 days prior is the issue   Please advise what other date this can be moved to, thanks

## 2019-06-23 ENCOUNTER — Encounter (HOSPITAL_COMMUNITY): Payer: Medicare Other

## 2019-06-30 ENCOUNTER — Telehealth: Payer: Self-pay | Admitting: Pulmonary Disease

## 2019-06-30 NOTE — Telephone Encounter (Signed)
Per Dr. Vaughan Browner she does not need to come in 07/02/19 since she is scheduled for a Bronch and it will be rescheduled for after her procedure. I attempted to call her and did not get an answer. I left a voicemail.

## 2019-06-30 NOTE — Telephone Encounter (Signed)
ATC pt, no answer. Left message for pt to call back.  

## 2019-06-30 NOTE — Telephone Encounter (Signed)
Patient returned call and she was made aware that the visit was not needed and that we will schedule her after her Bronch procedure. Nothing further is needed at this time.

## 2019-07-01 ENCOUNTER — Other Ambulatory Visit: Payer: Self-pay | Admitting: Rheumatology

## 2019-07-01 DIAGNOSIS — M0609 Rheumatoid arthritis without rheumatoid factor, multiple sites: Secondary | ICD-10-CM

## 2019-07-01 NOTE — Telephone Encounter (Signed)
Last Visit: 04/07/19 Next Visit: due December 2020. Message sent to the front to schedule patient  Labs: 04/07/19 CBC WNL. Glucose is elevated-113. Rest of CMP WNL Eye exam: 04/08/19  Okay to refill per Dr. Estanislado Pandy

## 2019-07-02 ENCOUNTER — Ambulatory Visit: Payer: Medicare Other | Admitting: Pulmonary Disease

## 2019-07-03 ENCOUNTER — Other Ambulatory Visit (HOSPITAL_COMMUNITY)
Admission: RE | Admit: 2019-07-03 | Discharge: 2019-07-03 | Disposition: A | Discharge status: Home / Self Care | Payer: Medicare Other | Source: Ambulatory Visit | Attending: Pulmonary Disease | Admitting: Pulmonary Disease

## 2019-07-03 ENCOUNTER — Telehealth: Payer: Self-pay | Admitting: Pulmonary Disease

## 2019-07-03 DIAGNOSIS — Z20828 Contact with and (suspected) exposure to other viral communicable diseases: Secondary | ICD-10-CM | POA: Diagnosis present

## 2019-07-03 NOTE — Telephone Encounter (Signed)
Spoke with pt. Advised her that it's fine for her to quarantine with her husband while she waits for her COVID screening test results. Nothing further was needed.

## 2019-07-04 ENCOUNTER — Other Ambulatory Visit: Payer: Self-pay | Admitting: Physician Assistant

## 2019-07-04 LAB — NOVEL CORONAVIRUS, NAA (HOSP ORDER, SEND-OUT TO REF LAB; TAT 18-24 HRS): SARS-CoV-2, NAA: NOT DETECTED

## 2019-07-06 ENCOUNTER — Ambulatory Visit (HOSPITAL_COMMUNITY)
Admission: RE | Admit: 2019-07-06 | Discharge: 2019-07-06 | Disposition: A | Discharge status: Home / Self Care | Payer: Medicare Other | Attending: Pulmonary Disease | Admitting: Pulmonary Disease

## 2019-07-06 ENCOUNTER — Ambulatory Visit (HOSPITAL_COMMUNITY): Payer: Medicare Other

## 2019-07-06 ENCOUNTER — Encounter (HOSPITAL_COMMUNITY)
Admission: RE | Disposition: A | Discharge status: Home / Self Care | Payer: Self-pay | Source: Home / Self Care | Attending: Pulmonary Disease

## 2019-07-06 ENCOUNTER — Ambulatory Visit (HOSPITAL_COMMUNITY)
Admission: RE | Admit: 2019-07-06 | Discharge: 2019-07-06 | Disposition: A | Discharge status: Home / Self Care | Payer: Medicare Other | Source: Ambulatory Visit | Attending: Pulmonary Disease | Admitting: Pulmonary Disease

## 2019-07-06 ENCOUNTER — Telehealth: Payer: Self-pay | Admitting: Pulmonary Disease

## 2019-07-06 ENCOUNTER — Encounter (HOSPITAL_COMMUNITY): Payer: Self-pay | Admitting: Respiratory Therapy

## 2019-07-06 DIAGNOSIS — R9389 Abnormal findings on diagnostic imaging of other specified body structures: Secondary | ICD-10-CM | POA: Insufficient documentation

## 2019-07-06 DIAGNOSIS — R918 Other nonspecific abnormal finding of lung field: Secondary | ICD-10-CM

## 2019-07-06 DIAGNOSIS — J9611 Chronic respiratory failure with hypoxia: Secondary | ICD-10-CM

## 2019-07-06 DIAGNOSIS — Z9889 Other specified postprocedural states: Secondary | ICD-10-CM

## 2019-07-06 HISTORY — PX: VIDEO BRONCHOSCOPY: SHX5072

## 2019-07-06 LAB — BODY FLUID CELL COUNT WITH DIFFERENTIAL
Eos, Fluid: 0 %
Eos, Fluid: 0 %
Lymphs, Fluid: 1 %
Lymphs, Fluid: 11 %
Monocyte-Macrophage-Serous Fluid: 1 % — ABNORMAL LOW (ref 50–90)
Monocyte-Macrophage-Serous Fluid: 80 % (ref 50–90)
Neutrophil Count, Fluid: 9 % (ref 0–25)
Neutrophil Count, Fluid: 98 % — ABNORMAL HIGH (ref 0–25)
Total Nucleated Cell Count, Fluid: 36 cu mm (ref 0–1000)
Total Nucleated Cell Count, Fluid: 990 cu mm (ref 0–1000)

## 2019-07-06 SURGERY — VIDEO BRONCHOSCOPY WITHOUT FLUORO
Anesthesia: Moderate Sedation | Laterality: Bilateral

## 2019-07-06 MED ORDER — FENTANYL CITRATE (PF) 100 MCG/2ML IJ SOLN
INTRAMUSCULAR | Status: AC
  Filled 2019-07-06: qty 4

## 2019-07-06 MED ORDER — MIDAZOLAM HCL (PF) 10 MG/2ML IJ SOLN
INTRAMUSCULAR | Status: DC | PRN
  Administered 2019-07-06 (×2): 1 mg via INTRAVENOUS

## 2019-07-06 MED ORDER — FENTANYL CITRATE (PF) 100 MCG/2ML IJ SOLN
INTRAMUSCULAR | Status: DC | PRN
  Administered 2019-07-06 (×2): 25 ug via INTRAVENOUS

## 2019-07-06 MED ORDER — LIDOCAINE HCL 1 % IJ SOLN
INTRAMUSCULAR | Status: DC | PRN
  Administered 2019-07-06: 6 mL via RESPIRATORY_TRACT

## 2019-07-06 MED ORDER — PHENYLEPHRINE HCL 0.25 % NA SOLN
1.0000 | Freq: Four times a day (QID) | NASAL | Status: DC | PRN
  Filled 2019-07-06: qty 15

## 2019-07-06 MED ORDER — MIDAZOLAM HCL (PF) 5 MG/ML IJ SOLN
INTRAMUSCULAR | Status: AC
  Filled 2019-07-06: qty 2

## 2019-07-06 MED ORDER — LIDOCAINE HCL URETHRAL/MUCOSAL 2 % EX GEL: 1.0000 "application " | Freq: Once | CUTANEOUS | Status: DC

## 2019-07-06 MED ORDER — LIDOCAINE HCL URETHRAL/MUCOSAL 2 % EX GEL
CUTANEOUS | Status: DC | PRN
  Administered 2019-07-06: 1

## 2019-07-06 MED ORDER — SODIUM CHLORIDE 0.9 % IV SOLN
INTRAVENOUS | Status: DC
  Administered 2019-07-06: 09:00:00 via INTRAVENOUS

## 2019-07-06 MED ORDER — PHENYLEPHRINE HCL 0.25 % NA SOLN
NASAL | Status: DC | PRN
  Administered 2019-07-06: 2 via NASAL

## 2019-07-06 MED ORDER — BUTAMBEN-TETRACAINE-BENZOCAINE 2-2-14 % EX AERO: 1.0000 | INHALATION_SPRAY | Freq: Once | CUTANEOUS | Status: DC

## 2019-07-06 NOTE — Telephone Encounter (Signed)
Received call from Dr. Vaughan Browner that patient needs to be started on oxygen today due to becoming hypoxic during procedure today. Placed urgent order for oxygen.   Called and spoke to patient. Patient stated she is feeling fine currently. Scheduled patient for follow up with Dr. Vaughan Browner for this week on 07/09/2019.   Routing to [facility] due to urgent oxygen referral.

## 2019-07-06 NOTE — Op Note (Signed)
[facility] Patient Name: Megan Porter Date: 07/06/2019 MRN: [id number] Attending MD: Marshell Garfinkel , MD Date of Birth: 1940-11-29 CSN: Finalized Age: 78 Admit Type: Ambulatory Gender: Female Procedure:            Bronchoscopy Indications:          Abnormal CT scan of chest, R/O MAI Providers:            Marshell Garfinkel, MD, Andre Lefort RRT,RCP, Ciro Backer RRT, RCP Referring MD:          Medicines:            Midazolam 3 mg mg IV, Fentanyl 75 mcg IV Complications:        No immediate complications Estimated Blood Loss: Estimated blood loss: none. Procedure:            Pre-Anesthesia Assessment:                       - A History and Physical has been performed. Patient                        meds and allergies have been reviewed. The risks and                        benefits of the procedure and the sedation options and                        risks were discussed with the patient. All questions                        were answered and informed consent was obtained.                        Patient identification and proposed procedure were                        verified prior to the procedure by the physician in the                        procedure room. Mental Status Examination: alert and                        oriented. Airway Examination: normal oropharyngeal                        airway. Respiratory Examination: clear to auscultation.                        CV Examination: normal. ASA Grade Assessment: I - A                        normal healthy patient. After reviewing the risks and                        benefits, the patient was deemed in satisfactory                        condition to undergo the procedure. The anesthesia  plan                        was to use moderate sedation / analgesia (conscious                        sedation). Immediately prior to administration of   medications, the patient was re-assessed for adequacy                        to receive sedatives. The heart rate, respiratory rate,                        oxygen saturations, blood pressure, adequacy of                        pulmonary ventilation, and response to care were                        monitored throughout the procedure. The physical status                        of the patient was re-assessed after the procedure.                       After obtaining informed consent, the bronchoscope was                        passed under direct vision. Throughout the procedure,                        the patient's blood pressure, pulse, and oxygen                        saturations were monitored continuously. the BF-H190                        ([id number]) Olympus Diagnostic Bronchoscope was                        introduced through the right nostril and advanced to                        the tracheobronchial tree of both lungs. The procedure                        was accomplished with ease. The patient tolerated the                        procedure fairly well. The total duration of the                        procedure was 18 minutes. Scope In: 9:19:13 AM Scope Out: 9:29:26 AM Findings:      The nasopharynx/oropharynx appears normal. The larynx appears normal.       The vocal cords appear normal. The subglottic space is normal. The       trachea is of normal caliber. The carina is sharp. The tracheobronchial       tree was examined to at least the first subsegmental level. Bronchial       mucosa and anatomy  are normal; there are no endobronchial lesions, and       no secretions.      Bronchoalveolar lavage was performed in the LUL superior lingular       segment (B4) of the lung and sent for cell count, bacterial culture, and       fungal & AFB analysis. 180 mL of fluid were instilled. 80 mL were       returned. The return was mucopurulent. There were no mucoid plugs in the       return  fluid. Multiple specimens were obtained and pooled into one       specimen, which was sent for analysis.      Bronchoalveolar lavage was performed in the RML medial segment (B5) of       the lung and sent for cell count, bacterial culture, and fungal & AFB       analysis. 180 mL of fluid were instilled. 100 mL were returned. The       return was mucopurulent. There were no mucoid plugs in the return fluid.       Multiple specimens were obtained and pooled into one specimen, which was       sent for analysis. Impression:           - Abnormal CT scan of chest, R/O MAI                       - The airway examination was normal.                       - Bronchoalveolar lavage was performed.                       - Bronchoalveolar lavage was performed. Moderate Sedation:      Moderate (conscious) sedation was administered by the endoscopy nurse       and supervised by the endoscopist. The following parameters were       monitored: oxygen saturation, heart rate, blood pressure, respiratory       rate, EKG, adequacy of pulmonary ventilation, and response to care.       Total physician intraservice time was 30 minutes. Recommendation:       - Await BAL results. Procedure Code(s):    --- Professional ---                       [phone], Bronchoscopy, rigid or flexible, including                        fluoroscopic guidance, when performed; with bronchial                        alveolar lavage                       [id number], Moderate sedation services provided by the same                        physician or other qualified health care professional                        performing the diagnostic or therapeutic service that  the sedation supports, requiring the presence of an                        independent trained observer to assist in the                        monitoring of the patient's level of consciousness and                        physiological status; initial 15 minutes of                         intraservice time, patient age 65 years or older                       [phone], Moderate sedation; each additional 15 minutes                        intraservice time Diagnosis Code(s):    --- Professional ---                       R93.89, Abnormal findings on diagnostic imaging of                        other specified body structures CPT copyright 2019 American Medical Association. All rights reserved. The codes documented in this report are preliminary and upon coder review may  be revised to meet current compliance requirements. Marshell Garfinkel, MD 07/06/2019 9:55:15 AM Number of Addenda: 0

## 2019-07-06 NOTE — Discharge Instructions (Signed)
Flexible Bronchoscopy, Care After This sheet gives you information about how to care for yourself after your test. Your doctor may also give you more specific instructions. If you have problems or questions, contact your doctor. Follow these instructions at home: Eating and drinking  Do not eat or drink anything (not even water) for 2 hours after your test, or until your numbing medicine (local anesthetic) wears off.  When your numbness is gone and your cough and gag reflexes have come back, you may: ? Eat only soft foods. ? Slowly drink liquids.  The day after the test, go back to your normal diet. Driving  Do not drive for 24 hours if you were given a medicine to help you relax (sedative).  Do not drive or use heavy machinery while taking prescription pain medicine. General instructions   Take over-the-counter and prescription medicines only as told by your doctor.  Return to your normal activities as told. Ask what activities are safe for you.  Do not use any products that have nicotine or tobacco in them. This includes cigarettes and e-cigarettes. If you need help quitting, ask your doctor.  Keep all follow-up visits as told by your doctor. This is important. It is very important if you had a tissue sample (biopsy) taken. Get help right away if:  You have shortness of breath that gets worse.  You get light-headed.  You feel like you are going to pass out (faint).  You have chest pain.  You cough up: ? More than a little blood. ? More blood than before. Summary  Do not eat or drink anything (not even water) for 2 hours after your test, or until your numbing medicine wears off.  Do not use cigarettes. Do not use e-cigarettes.  Get help right away if you have chest pain. This information is not intended to replace advice given to you by your health care provider. Make sure you discuss any questions you have with your health care provider. Document Released: 07/01/2009  Document Revised: 08/16/2017 Document Reviewed: 09/21/2016 Elsevier Patient Education  2020 [place].  Nothing to eat or drink until 12:00 PM 07/06/19 Please call [phone] with any questions

## 2019-07-06 NOTE — Telephone Encounter (Signed)
I sent this to [employer] & this was their response:  Burman Nieves, I'm currently on an extended hold trying to get through to your office. We need O2 sats to go with this referral and we're not seeing them in Epic anywhere. Can you help with that?   Also, I see that Dr. Vaughan Browner has written to order a percussion vest for this patient as well. Can we go ahead and get started on that too since we're providing the O2?   Thanks  [employer]

## 2019-07-06 NOTE — Progress Notes (Signed)
Video Bronchoscopy Done   Intervention Bronchial Washing X2

## 2019-07-06 NOTE — Progress Notes (Signed)
25mg  Fentanyl given at 0920 1mg  of Versed given at 0921  Total during procedure 75mg  Fentanyl & 3mg  of Versed   Witnessed by Andre Lefort   Unable to chart last dose

## 2019-07-06 NOTE — Telephone Encounter (Signed)
Received call from [place] at [employer]. Patient at next office visit will need a walk to qualify for oxygen. Patient has agreed to self pay for the oxygen until qualification.

## 2019-07-07 LAB — ACID FAST SMEAR (AFB, MYCOBACTERIA)
Acid Fast Smear: NEGATIVE
Acid Fast Smear: NEGATIVE

## 2019-07-07 NOTE — Telephone Encounter (Signed)
Patient is scheduled for follow up with Dr Vaughan Browner on 10/22.  Routing to Dr. Vaughan Browner for documentation and order to be placed for the percussion vest.

## 2019-07-07 NOTE — Telephone Encounter (Signed)
Melissa called from [employer] - she states that per list ov note Dr. Vaughan Browner wanted to order a percussion vest - they have not received an order. She wanted to see if he would consider this when seeing the patient next on 07/09/2019 - Melissa can be reached at [phone]

## 2019-07-08 ENCOUNTER — Encounter (HOSPITAL_COMMUNITY): Payer: Self-pay | Admitting: Pulmonary Disease

## 2019-07-08 LAB — CULTURE, BAL-QUANTITATIVE W GRAM STAIN
Culture: NORMAL
Culture: NORMAL

## 2019-07-09 ENCOUNTER — Ambulatory Visit: Payer: Medicare Other | Admitting: Pulmonary Disease

## 2019-07-09 ENCOUNTER — Other Ambulatory Visit: Payer: Self-pay

## 2019-07-09 ENCOUNTER — Encounter: Payer: Self-pay | Admitting: Pulmonary Disease

## 2019-07-09 VITALS — BP 110/60 | HR 82 | Temp 98.0°F | Ht 62.0 in | Wt 125.2 lb

## 2019-07-09 DIAGNOSIS — J479 Bronchiectasis, uncomplicated: Secondary | ICD-10-CM | POA: Diagnosis not present

## 2019-07-09 NOTE — Progress Notes (Deleted)
Megan Porter    [id number]    July 25, 1941  Primary Care Physician:Tisovec, Fransico Him, MD  Referring Physician: Haywood Pao, MD [address] [place],  [place] [address]  Chief complaint:   Follow-up for bronchiectasis, pneumonia   HPI: Megan Porter is a 78 year old with rheumatoid arthritis, cancer.She was hospitalized in May 2018 at [facility] for respiratory failure with multilobar infiltrates on CT scan. Flu test, Legionella urine test was negative.She was treated with a prednisone taper and Levaquin. She apparently had drug reaction with hives to Levaquin and it was changed to a different antibiotics. Since her return to [facility] she's had a follow-up CT scan earlier this month which showed resolution of the infiltrates.There is some mild bronchiectasis with mucus plugging in the right middle lobe. She has been referred to pulmonary for further evaluation.  Hospitalized in early January 2020 for acute hypoxic respiratory failure secondary to pneumonia with chest x-ray showing right upper lobe infiltrate. Work-up including urine pneumococcus, respiratory virus panel and blood cultures were negative.   She has history of rheumatoid arthritis and is followed by Megan Porter. She was being maintained on methotrexate and Plaquenil.  Since her hospitalization she was taken off methotrexate  Pets:No pets. She used to have a dog. She does not have any birds, exotic pets or exposure to farm animals Occupation:Homemaker Exposures:No known exposures. No mold issues at home. Smoking history:Never smoker  Interim history: Continues to have daily symptoms of cough, chest congestion We had tried to get sputum for culture and evaluation of MAI but she is unable to get a specimen Denies any fevers, chills  Continues using the flutter valve.  Outpatient Encounter Medications as of 07/09/2019  Medication Sig  . Biotin (BIOTIN 5000) 5 MG CAPS Take 5,000 mg by mouth daily.  .  budesonide-formoterol (SYMBICORT) 80-4.5 MCG/ACT inhaler Inhale 2 puffs into the lungs 2 (two) times daily.  Marland Kitchen CALCIUM CITRATE PO Take 600 mg of elemental calcium by mouth daily.   . Cholecalciferol (VITAMIN D) 50 MCG (2000 UT) CAPS Take 2,000 Units by mouth daily.   . citalopram (CELEXA) 40 MG tablet TAKE 1 TABLET BY MOUTH  DAILY (Patient taking differently: Take 40 mg by mouth daily. )  . hydroxychloroquine (PLAQUENIL) 200 MG tablet TAKE 1 TABLET BY MOUTH  DAILY  . levothyroxine (SYNTHROID, LEVOTHROID) 50 MCG tablet Take 50 mcg by mouth daily before breakfast.   . Multiple Vitamin (MULTIVITAMIN) capsule Take 1 capsule by mouth daily.  . raloxifene (EVISTA) 60 MG tablet Take 60 mg by mouth daily.   Marland Kitchen Respiratory Therapy Supplies (FLUTTER) DEVI 10 times Twice a day and prn as needed, may increase if feeling worse  . traZODone (DESYREL) 50 MG tablet TAKE ONE-HALF TABLET BY  MOUTH AT BEDTIME   No facility-administered encounter medications on file as of 07/09/2019.    Physical Exam: Blood pressure 118/76, pulse 74, temperature 98.4 F (36.9 C), temperature source Temporal, height _0  (1.6 m), weight 125 lb (56.7 kg), SpO2 95 %. Gen:      No acute distress HEENT:  EOMI, sclera anicteric Neck:     No masses; no thyromegaly Lungs:    Clear to auscultation bilaterally; normal respiratory effort CV:         Regular rate and rhythm; no murmurs Abd:      + bowel sounds; soft, non-tender; no palpable masses, no distension Ext:    No edema; adequate peripheral perfusion Skin:  Warm and dry; no rash Neuro: alert and oriented x 3 Psych: normal mood and affect  Data Reviewed: Imaging: DATA from [facility] Chest x-ray 12/16/16- left upper lobe consolidation, right mid lung consolidation CT chest 12/18/16- Small to moderate bilateral pleural effusion, dense alveolar infiltrate throughout the left upper lobe, right middle lobe and left lower lobe with underlying nodularity. Mediastinal  lymphadenopathy.  CT scan 04/23/17-  Mild bronchiectasis and mucous plugging in the right middle lobe, lingula. No lymphadenopathy.  Chest x-ray 09/21/2018- right upper lobe airspace opacity. Chest x-ray 09/25/2018- progressive right upper lobe and left upper lobe infiltrates. High-resolution CT chest 02/25/2019- nodularity, consolidation and bronchiectasis in the lingula which is worsened since 2018.  New nodularity in the left lower lobe.  Bronchial wall thickening and scattered bronchial plugging throughout the lungs.  No evidence of pulmonary fibrosis I have reviewed the images personally.  PFTs 08/06/17 FVC 1.82 [73%], FEV1 1.32 [71%], F/F 73, TLC 67%, DLCO 70% Mild obstruction, moderate restriction, mild diffusion defect  Assessment:  Bronchiectasis She has recurrent episodes of pneumonia with CT scan showing bronchiectasis, mucous plugging, nodularity Concern for atypical mycobacterial infection.  Since she is not able to bring up sputum we will schedule bronchoscopy with BAL for further evaluation  Start Symbicort Continue Mucinex, flutter valve Order percussion vest for mucociliary clearance.  Rheumatoid arthritis Bronchiectasis could be a manifestation of rheumatoid arthritis Currently on Plaquenil.  Methotrexate on hold since early 2020 due to recurrent pneumonias. Follows with Megan Porter  Health maintenance Flu vaccine today 04/13/2014-Prevnar 10/13/2018-Pneumovax  Plan/Recommendations: - Schedule bronchoscopy - Start Symbicort - Mucinex, flutter valve - Order percussion vest.  Marshell Garfinkel MD [facility] 07/09/2019, 9:59 AM  CC: Tisovec, Fransico Him, MD

## 2019-07-09 NOTE — Patient Instructions (Signed)
Glad you are feeling better after your recent bronchoscopy The results from bronchoscopy so far negative Resume the Symbicort You can use Flonase nasal spray for nasal drainage Continue the flutter valve and Mucinex We will start you on a percussion vest to help with clearance of secretions  We will start oxygen as serial O2 levels adequate I would advise you to continue isolation and limit family gatherings these holidays as you are at a high risk if you catch COVID-19  Follow-up in 6 months.

## 2019-07-09 NOTE — Progress Notes (Signed)
Megan Porter    [id number]    10/02/1940  Primary Care Physician:Tisovec, Fransico Him, MD  Referring Physician: Haywood Pao, MD [address] [place],  [place] [address]  Chief complaint:   Follow-up for bronchiectasis, pneumonia   HPI: Megan Porter is a 78 year old with rheumatoid arthritis, cancer.She was hospitalized in May 2018 at [facility] for respiratory failure with multilobar infiltrates on CT scan. Flu test, Legionella urine test was negative.She was treated with a prednisone taper and Levaquin. She apparently had drug reaction with hives to Levaquin and it was changed to a different antibiotics. Since her return to [facility] she's had a follow-up CT scan earlier this month which showed resolution of the infiltrates.There is some mild bronchiectasis with mucus plugging in the right middle lobe. She has been referred to pulmonary for further evaluation.  Hospitalized in early January 2020 for acute hypoxic respiratory failure secondary to pneumonia with chest x-ray showing right upper lobe infiltrate. Work-up including urine pneumococcus, respiratory virus panel and blood cultures were negative.   She has history of rheumatoid arthritis and is followed by Dr. Estanislado Pandy. She was being maintained on methotrexate and Plaquenil.  Since her hospitalization she was taken off methotrexate  Pets:No pets. She used to have a dog. She does not have any birds, exotic pets or exposure to farm animals Occupation:Homemaker Exposures:No known exposures. No mold issues at home. Smoking history:Never smoker  Interim history: We had tried to get sputum for culture and evaluation of MAI but she is unable to get a specimen Underwent bronchoscopy on 10/19.  Post procedure she had desats and was started on supplemental oxygen.  Is here for follow-up visit to ensure recovery.  She is doing well still has some chest congestion and nose congestion with chronic cough.  She did not  desat today on exertion Continues using the flutter valve.  Outpatient Encounter Medications as of 07/09/2019  Medication Sig  . Biotin (BIOTIN 5000) 5 MG CAPS Take 5,000 mg by mouth daily.  . budesonide-formoterol (SYMBICORT) 80-4.5 MCG/ACT inhaler Inhale 2 puffs into the lungs 2 (two) times daily.  Marland Kitchen CALCIUM CITRATE PO Take 600 mg of elemental calcium by mouth daily.   . Cholecalciferol (VITAMIN D) 50 MCG (2000 UT) CAPS Take 2,000 Units by mouth daily.   . citalopram (CELEXA) 40 MG tablet TAKE 1 TABLET BY MOUTH  DAILY (Patient taking differently: Take 40 mg by mouth daily. )  . hydroxychloroquine (PLAQUENIL) 200 MG tablet TAKE 1 TABLET BY MOUTH  DAILY  . levothyroxine (SYNTHROID, LEVOTHROID) 50 MCG tablet Take 50 mcg by mouth daily before breakfast.   . Multiple Vitamin (MULTIVITAMIN) capsule Take 1 capsule by mouth daily.  . raloxifene (EVISTA) 60 MG tablet Take 60 mg by mouth daily.   Marland Kitchen Respiratory Therapy Supplies (FLUTTER) DEVI 10 times Twice a day and prn as needed, may increase if feeling worse  . traZODone (DESYREL) 50 MG tablet TAKE ONE-HALF TABLET BY  MOUTH AT BEDTIME   No facility-administered encounter medications on file as of 07/09/2019.    Physical Exam: Blood pressure 110/60, pulse 82, temperature 98 F (36.7 C), temperature source Temporal, height _0  (1.575 m), weight 125 lb 3.2 oz (56.8 kg), SpO2 100 %. Gen:      No acute distress HEENT:  EOMI, sclera anicteric Neck:     No masses; no thyromegaly Lungs:    Clear to auscultation bilaterally; normal respiratory effort CV:  Regular rate and rhythm; no murmurs Abd:      + bowel sounds; soft, non-tender; no palpable masses, no distension Ext:    No edema; adequate peripheral perfusion Skin:      Warm and dry; no rash Neuro: alert and oriented x 3 Psych: normal mood and affect  Data Reviewed: Imaging: DATA from [facility] Chest x-ray 12/16/16- left upper lobe consolidation, right mid lung consolidation CT  chest 12/18/16- Small to moderate bilateral pleural effusion, dense alveolar infiltrate throughout the left upper lobe, right middle lobe and left lower lobe with underlying nodularity. Mediastinal lymphadenopathy.  CT scan 04/23/17-  Mild bronchiectasis and mucous plugging in the right middle lobe, lingula. No lymphadenopathy.  Chest x-ray 09/21/2018- right upper lobe airspace opacity. Chest x-ray 09/25/2018- progressive right upper lobe and left upper lobe infiltrates. High-resolution CT chest 02/25/2019- nodularity, consolidation and bronchiectasis in the lingula which is worsened since 2018.  New nodularity in the left lower lobe.  Bronchial wall thickening and scattered bronchial plugging throughout the lungs.  No evidence of pulmonary fibrosis I have reviewed the images personally.  PFTs 08/06/17 FVC 1.82 [73%], FEV1 1.32 [71%], F/F 73, TLC 67%, DLCO 70% Mild obstruction, moderate restriction, mild diffusion defect  Labs: BAL 07/06/2019 Cell count-nucleated cells 990, 98% neutrophils 0% eos Cell count nucleated cells 36, 80% monocyte macrophage, 0% eos AFB, fungus, cultures-pending  Assessment:  Bronchiectasis She has recurrent episodes of pneumonia with CT scan showing bronchiectasis, mucous plugging, nodularity Concern for atypical mycobacterial infection.  As she was not able to bring up sputum we have done bronchoscopy with BAL.  Results so far are negative  Continue Symbicort Continue Mucinex, flutter valve Order percussion vest for mucociliary clearance.  As she has chronic bronchitis, bronchiectasis with persistent cough with mucus production for greater than 6 months/year and has not responded to flutter valve. Stop supplemental oxygen as her O2 levels today are fine  Rheumatoid arthritis Bronchiectasis could be a manifestation of rheumatoid arthritis Currently on Plaquenil.  Methotrexate on hold since early 2020 due to recurrent pneumonias. Follows with Dr. Estanislado Pandy  She has  asked my opinion about having a family gathering for Thanksgiving.  Advised her to avoid this year as she is at high risk  Health maintenance 05/29/2019-flu vaccine 04/13/2014-Prevnar 10/13/2018-Pneumovax  Plan/Recommendations: - Continue Symbicort - Mucinex, flutter valve - Order percussion vest.  Marshell Garfinkel MD [facility] 07/09/2019, 10:04 AM  CC: Tisovec, Fransico Him, MD

## 2019-08-06 LAB — FUNGUS CULTURE WITH STAIN

## 2019-08-06 LAB — FUNGUS CULTURE RESULT

## 2019-08-06 LAB — FUNGAL ORGANISM REFLEX

## 2019-08-19 ENCOUNTER — Other Ambulatory Visit: Payer: Self-pay | Admitting: Rheumatology

## 2019-08-19 DIAGNOSIS — M0609 Rheumatoid arthritis without rheumatoid factor, multiple sites: Secondary | ICD-10-CM

## 2019-08-28 LAB — ACID FAST CULTURE WITH REFLEXED SENSITIVITIES (MYCOBACTERIA)
Acid Fast Culture: NEGATIVE
Acid Fast Culture: NEGATIVE

## 2019-08-31 ENCOUNTER — Telehealth: Payer: Self-pay | Admitting: Pulmonary Disease

## 2019-08-31 NOTE — Telephone Encounter (Signed)
Called and spoke with pt letting her know the results from the bronch cultures.pt verbalized understanding. Nothing further needed.

## 2019-09-21 NOTE — Progress Notes (Signed)
Virtual Visit via Telephone Note  I connected with Megan Porter on 09/21/19 at  9:30 AM EST by telephone and verified that I am speaking with the correct person using two identifiers.  Location: Patient: Home Provider: Clinic This service was conducted via virtual visit.  The patient was located at home. I was located in my office.  Consent was obtained prior to the virtual visit and is aware of possible charges through their insurance for this visit.  The patient is an established patient.  Dr. Estanislado Pandy, MD conducted the virtual visit and Hazel Sams, PA-C acted as scribe during the service.  Office staff helped with scheduling follow up visits after the service was conducted.     I discussed the limitations, risks, security and privacy concerns of performing an evaluation and management service by telephone and the availability of in person appointments. I also discussed with the patient that there may be a patient responsible charge related to this service. The patient expressed understanding and agreed to proceed.  CC: Left knee joint pain  History of Present Illness: Megan Porter is a 79 y.o. female history of rheumatoid arthritis, osteoarthritis and degenerative disc disease.  She is taking plaquenil 200 mg 1 tablet by mouth daily.  She discontinued MTX in April 2020 due to recurrent pneumonia. She has not had any recent rheumatoid arthritis flares.  She had arthroscopic surgery on the left knee for a meniscal repair 1 month ago performed by Dr. Wynelle Link.  She continues to have discomfort and mild inflammation but is noticing gradual improvement.  She is not having any other joint pain or joint swelling at this time.   Review of Systems  Constitutional: Positive for malaise/fatigue. Negative for fever.  HENT: Negative for congestion.   Eyes: Negative for photophobia, pain, discharge and redness.  Respiratory: Negative for cough, shortness of breath and wheezing.   Cardiovascular:  Negative for chest pain, palpitations and leg swelling.  Gastrointestinal: Positive for constipation. Negative for blood in stool and diarrhea.  Genitourinary: Negative for dysuria and frequency.  Musculoskeletal: Positive for joint pain. Negative for back pain, myalgias and neck pain.  Skin: Negative for rash.  Neurological: Negative for dizziness and headaches.  Psychiatric/Behavioral: Negative for depression and memory loss. The patient is not nervous/anxious and does not have insomnia.      Observations/Objective: Physical Exam  Constitutional: She is oriented to person, place, and time.  Neurological: She is alert and oriented to person, place, and time.  Psychiatric: Mood, memory, affect and judgment normal.    Patient reports morning stiffness for 1  hour.   Patient denies nocturnal pain.  Difficulty dressing/grooming: Denies Difficulty climbing stairs: Denies Difficulty getting out of chair: Denies Difficulty using hands for taps, buttons, cutlery, and/or writing: Reports  Assessment and Plan: Visit Diagnoses: Rheumatoid arthritis, seronegative, multiple sites ([place]) - Erosive disease neg RF,CCP, and ANA: She has not had any recent rheumatoid arthritis flares. She is clinically doing well on plaquenil 200 mg 1 tablet by mouth daily.  She was taken off of MTX in April 2020 due to recurrent pneumonia and underlying bronchiectasis.  She is experiencing pain and inflammation in the left knee joint s/p an arthroscopic meniscal repair by Dr. Wynelle Link 1 month ago. She is not having any other joint pain or joint inflammation at this time.  She will continue on PLQ monotherapy.  She does not need a refill at this time.  She was advised to notify us if she develops increased joint  pain or inflammation.  She will follow up in 4 months.   High risk medications (not anticoagulants) long-term use - Plaquenil 200 mg 1 tablet by mouth daily. D/c MTX in April 2020 due to recurrent  infections-pneumonia x2 in 1 yr. PLQ Eye Exam 04/08/19-Follow up in 6 months. CBC and CMP were drawn on 04/07/19.  She is overdue to update lab work.  Standing orders are in place.  Primary osteoarthritis of both hands: She is not having any increased joint pain or inflammation at this time.    Primary osteoarthritis of both knees: She is experiencing increased discomfort in the left knee joint s/p arthroscopic meniscal repair 1 month ago. She is not having any right knee joint pain at this time.   S/P left knee arthroscopy: Meniscal repair-performed by Dr. Wynelle Link 1 month ago.  She has ongoing discomfort and inflammation but she is not noticing gradual improvement.   Primary osteoarthritis of both feet: She has no feet pain or inflammation at this time.   DDD (degenerative disc disease), lumbar: history of lumbar surgery 1970   Age-related osteoporosis without current pathological fracture - She is taking Evista 60 mg 1 tablet by mouth daily and a calcium and vitamin D supplement.  DEXA results are not in Epic.  Other medical conditions are listed as follows:   History of breast cancer   History of anxiety   Follow Up Instructions: She will follow up in 4 months.    I discussed the assessment and treatment plan with the patient. The patient was provided an opportunity to ask questions and all were answered. The patient agreed with the plan and demonstrated an understanding of the instructions.   The patient was advised to call back or seek an in-person evaluation if the symptoms worsen or if the condition fails to improve as anticipated.  I provided 15 minutes of non-face-to-face time during this encounter.  Bo Merino, MD   Scribed by-  Hazel Sams, PA-C

## 2019-09-23 ENCOUNTER — Telehealth (INDEPENDENT_AMBULATORY_CARE_PROVIDER_SITE_OTHER): Payer: Medicare Other | Admitting: Rheumatology

## 2019-09-23 ENCOUNTER — Other Ambulatory Visit: Payer: Self-pay

## 2019-09-23 ENCOUNTER — Encounter: Payer: Self-pay | Admitting: Rheumatology

## 2019-09-23 DIAGNOSIS — M0609 Rheumatoid arthritis without rheumatoid factor, multiple sites: Secondary | ICD-10-CM | POA: Diagnosis not present

## 2019-09-23 DIAGNOSIS — M19041 Primary osteoarthritis, right hand: Secondary | ICD-10-CM

## 2019-09-23 DIAGNOSIS — Z79899 Other long term (current) drug therapy: Secondary | ICD-10-CM

## 2019-09-23 DIAGNOSIS — Z9889 Other specified postprocedural states: Secondary | ICD-10-CM | POA: Diagnosis not present

## 2019-09-23 DIAGNOSIS — M19071 Primary osteoarthritis, right ankle and foot: Secondary | ICD-10-CM

## 2019-09-23 DIAGNOSIS — M19042 Primary osteoarthritis, left hand: Secondary | ICD-10-CM

## 2019-09-23 DIAGNOSIS — Z8659 Personal history of other mental and behavioral disorders: Secondary | ICD-10-CM

## 2019-09-23 DIAGNOSIS — M810 Age-related osteoporosis without current pathological fracture: Secondary | ICD-10-CM

## 2019-09-23 DIAGNOSIS — M19072 Primary osteoarthritis, left ankle and foot: Secondary | ICD-10-CM

## 2019-09-23 DIAGNOSIS — M5136 Other intervertebral disc degeneration, lumbar region: Secondary | ICD-10-CM

## 2019-09-23 DIAGNOSIS — M170 Bilateral primary osteoarthritis of knee: Secondary | ICD-10-CM

## 2019-09-23 DIAGNOSIS — M51369 Other intervertebral disc degeneration, lumbar region without mention of lumbar back pain or lower extremity pain: Secondary | ICD-10-CM

## 2019-09-23 DIAGNOSIS — Z853 Personal history of malignant neoplasm of breast: Secondary | ICD-10-CM

## 2019-09-25 LAB — COMPLETE METABOLIC PANEL WITH GFR
AG Ratio: 1.6 (calc) (ref 1.0–2.5)
ALT: 13 U/L (ref 6–29)
AST: 17 U/L (ref 10–35)
Albumin: 4.2 g/dL (ref 3.6–5.1)
Alkaline phosphatase (APISO): 62 U/L (ref 37–153)
BUN/Creatinine Ratio: 17 (calc) (ref 6–22)
BUN: 19 mg/dL (ref 7–25)
CO2: 31 mmol/L (ref 20–32)
Calcium: 9.1 mg/dL (ref 8.6–10.4)
Chloride: 104 mmol/L (ref 98–110)
Creat: 1.1 mg/dL — ABNORMAL HIGH (ref 0.60–0.88)
GFR, Est African American: 55 mL/min/{1.73_m2} — ABNORMAL LOW (ref >=60)
GFR, Est Non African American: 47 mL/min/{1.73_m2} — ABNORMAL LOW (ref >=60)
Globulin: 2.6 g/dL (calc) (ref 1.9–3.7)
Glucose, Bld: 124 mg/dL — ABNORMAL HIGH (ref 65–99)
Potassium: 3.9 mmol/L (ref 3.5–5.3)
Sodium: 143 mmol/L (ref 135–146)
Total Bilirubin: 0.3 mg/dL (ref 0.2–1.2)
Total Protein: 6.8 g/dL (ref 6.1–8.1)

## 2019-09-25 LAB — CBC WITH DIFFERENTIAL/PLATELET
Absolute Monocytes: 440 cells/uL (ref 200–950)
Basophils Absolute: 43 cells/uL (ref 0–200)
Basophils Relative: 0.6 %
Eosinophils Absolute: 270 cells/uL (ref 15–500)
Eosinophils Relative: 3.8 %
HCT: 38.5 % (ref 35.0–45.0)
Hemoglobin: 13 g/dL (ref 11.7–15.5)
Lymphs Abs: 1008 cells/uL (ref 850–3900)
MCH: 32.7 pg (ref 27.0–33.0)
MCHC: 33.8 g/dL (ref 32.0–36.0)
MCV: 96.7 fL (ref 80.0–100.0)
MPV: 10.9 fL (ref 7.5–12.5)
Monocytes Relative: 6.2 %
Neutro Abs: 5339 cells/uL (ref 1500–7800)
Neutrophils Relative %: 75.2 %
Platelets: 245 10*3/uL (ref 140–400)
RBC: 3.98 10*6/uL (ref 3.80–5.10)
RDW: 11.9 % (ref 11.0–15.0)
Total Lymphocyte: 14.2 %
WBC: 7.1 10*3/uL (ref 3.8–10.8)

## 2019-09-25 NOTE — Progress Notes (Signed)
CBC is normal.  Glucose is mildly elevated probably not fasting.  Creatinine is mildly elevated.  I would encourage increase fluid intake.  Please advise patient not to take any NSAIDs.

## 2019-10-07 ENCOUNTER — Telehealth: Payer: Self-pay | Admitting: Rheumatology

## 2019-10-07 NOTE — Telephone Encounter (Signed)
-----   Message from House sent at 09/23/2019 12:09 PM EST ----- Patient due for a follow up in 4 months. Thanks!

## 2019-10-07 NOTE — Telephone Encounter (Signed)
I Lmom for patient to call ,and schedule follow u papptointment around 12/18/2019.

## 2019-10-09 ENCOUNTER — Telehealth: Payer: Self-pay | Admitting: Pulmonary Disease

## 2019-10-09 NOTE — Telephone Encounter (Signed)
I called and spoke with the patient and made her aware that she does not have to return the percussion vest. I advised her that she can keep it and use it just in case she needs to use it at a future date as she needs it. She verbalized understanding.

## 2019-10-22 ENCOUNTER — Other Ambulatory Visit: Payer: Self-pay | Admitting: Rheumatology

## 2019-10-22 DIAGNOSIS — M0609 Rheumatoid arthritis without rheumatoid factor, multiple sites: Secondary | ICD-10-CM

## 2019-10-22 NOTE — Telephone Encounter (Signed)
Last Visit: 09/23/2019 telemedicine  Next Visit: 12/30/2019 Labs: 09/24/2019 CBC is normal. Glucose is mildly elevated probably not fasting. Creatinine is mildly elevated.  Eye exam: 04/08/2019  Okay to refill per Dr. Estanislado Pandy.

## 2019-12-23 ENCOUNTER — Other Ambulatory Visit: Payer: Self-pay | Admitting: Rheumatology

## 2019-12-23 DIAGNOSIS — M0609 Rheumatoid arthritis without rheumatoid factor, multiple sites: Secondary | ICD-10-CM

## 2019-12-30 ENCOUNTER — Ambulatory Visit: Payer: Self-pay | Admitting: Physician Assistant

## 2020-01-12 NOTE — Progress Notes (Signed)
Office Visit Note  Patient: Megan Porter             Date of Birth: 09/19/1940           MRN: [id number]             PCP: Haywood Pao, MD Referring: Haywood Pao, MD Visit Date: 01/13/2020 Occupation: @GUAROCC @  Subjective:  Right hip pain   History of Present Illness: Megan Porter is a 79 y.o. female with history of seronegative rheumatoid arthritis and osteoarthritis.  Patient is taking Plaquenil 200 mg 1 tablet by mouth daily.  Patient reports that since discontinuing methotrexate in April 2020 she has been experiencing increased arthralgias.  She states that her discomfort and stiffness is primarily in the left knee joint, both hips, and her lower back.  She states that she had a meniscal tear in the left knee which was operated on by Dr. Reynaldo Minium in December 2020.  She continues to have discomfort in the left knee and has been performing home exercises.  She is also been having increased discomfort in the right hip and has noticed some limited range of motion.  She has ongoing trochanter bursitis bilaterally.  She is not having any joint pain or joint swelling in her hands.  Patient reports that she was recently switched from Evista to Fosamax 70 mg 1 tablet by mouth once weekly.  She has had 1 dose of Fosamax and tolerated it without any side effects.  She continues take calcium and vitamin D supplement on a daily basis.   Activities of Daily Living:  Patient reports morning stiffness for 3 hours.   Patient Denies nocturnal pain.  Difficulty dressing/grooming: Denies Difficulty climbing stairs: Reports Difficulty getting out of chair: Denies Difficulty using hands for taps, buttons, cutlery, and/or writing: Denies  Review of Systems  Constitutional: Positive for fatigue.  HENT: Positive for mouth dryness. Negative for mouth sores and nose dryness.   Eyes: Negative for pain, visual disturbance and dryness.  Respiratory: Negative for cough, hemoptysis, shortness of  breath and difficulty breathing.   Cardiovascular: Negative for chest pain, palpitations, hypertension and swelling in legs/feet.  Gastrointestinal: Positive for constipation. Negative for blood in stool and diarrhea.  Endocrine: Negative for excessive thirst and increased urination.  Genitourinary: Negative for difficulty urinating and painful urination.  Musculoskeletal: Positive for joint swelling and morning stiffness. Negative for arthralgias, joint pain, myalgias, muscle weakness, muscle tenderness and myalgias.  Skin: Negative for color change, pallor, rash, hair loss, nodules/bumps, skin tightness, ulcers and sensitivity to sunlight.  Allergic/Immunologic: Negative for susceptible to infections.  Neurological: Negative for dizziness, numbness, headaches and weakness.  Hematological: Negative for swollen glands.  Psychiatric/Behavioral: Negative for depressed mood and sleep disturbance. The patient is not nervous/anxious.     PMFS History:  Patient Active Problem List   Diagnosis Date Noted  . Abnormal CT scan of lung   . Hypokalemia 09/21/2018  . Benign essential HTN 09/21/2018  . Leucocytosis 09/21/2018  . Pneumonia 09/21/2018  . Bronchiectasis without complication ([place]) [id number]  . Primary osteoarthritis of both knees 04/06/2017  . DDD (degenerative disc disease), lumbar history of lumbar surgery 1970 04/06/2017  . Age-related osteoporosis without current pathological fracture 04/06/2017  . History of anxiety 04/06/2017  . History of breast cancer 04/04/2017  . Primary osteoarthritis of both feet 10/02/2016  . Primary osteoarthritis of both hands 10/02/2016  . High risk medications (not anticoagulants) long-term use 10/02/2016  . Rheumatoid arthritis, seronegative, multiple  sites [facility]) 10/02/2016    Past Medical History:  Diagnosis Date  . Arthritis   . Cancer ([place])   . Osteoarthritis     Family History  Family history unknown: Yes   Past Surgical History:    Procedure Laterality Date  . BACK SURGERY    . BREAST RECONSTRUCTION    . knee arthroscopic    . VIDEO BRONCHOSCOPY Bilateral 07/06/2019   Procedure: VIDEO BRONCHOSCOPY WITHOUT FLUORO;  Surgeon: Marshell Garfinkel, MD;  Location: [facility];  Service: Cardiopulmonary;  Laterality: Bilateral;   Social History   Social History Narrative  . Not on file   Immunization History  Administered Date(s) Administered  . Fluad Quad(high Dose 65+) 05/29/2019  . Influenza-Unspecified 07/06/2017, 07/24/2018  . Pneumococcal Conjugate-13 04/13/2014  . Pneumococcal Polysaccharide-23 10/13/2018     Objective: Vital Signs: BP (!) 149/65 (BP Location: Left Arm, Patient Position: Sitting, Cuff Size: Normal)   Pulse 72   Resp 16   Ht 5\' 3"  (1.6 m)   Wt 126 lb (57.2 kg)   BMI 22.32 kg/m    Physical Exam Vitals and nursing note reviewed.  Constitutional:      Appearance: She is well-developed.  HENT:     Head: Normocephalic and atraumatic.  Eyes:     Conjunctiva/sclera: Conjunctivae normal.  Pulmonary:     Effort: Pulmonary effort is normal.  Abdominal:     General: Bowel sounds are normal.     Palpations: Abdomen is soft.  Musculoskeletal:     Cervical back: Normal range of motion.  Lymphadenopathy:     Cervical: No cervical adenopathy.  Skin:    General: Skin is warm and dry.     Capillary Refill: Capillary refill takes less than 2 seconds.  Neurological:     Mental Status: She is alert and oriented to person, place, and time.  Psychiatric:        Behavior: Behavior normal.      Musculoskeletal Exam: C-spine has good range of motion.  Thoracic kyphosis noted.  She has painful range of motion lumbar spine.  Midline spinal tenderness in the lumbar region noted.  Shoulder joints, elbow joints, wrist joints have good range of motion with no discomfort.  She has PIP and DIP thickening consistent with osteoarthritis of both hands.  [place] joint prominence bilaterally.  She has complete fist  formation bilaterally.  Right hip has limited range of motion with discomfort.  Left hip has full range of motion with no discomfort.  She has tenderness over bilateral trochanteric bursa.  Right knee has good range of motion with no discomfort.  No warmth or effusion of the right knee was noted.  She has limited extension of the left knee with mild inflammation.  Ankle joints have good range of motion no tenderness or inflammation.  No tenderness of MTP joints.  CDAI Exam: CDAI Score: -- Patient Global: --; Provider Global: -- Swollen: --; Tender: -- Joint Exam 01/13/2020   No joint exam has been documented for this visit   There is currently no information documented on the homunculus. Go to the Rheumatology activity and complete the homunculus joint exam.  Investigation: No additional findings.  Imaging: No results found.  Recent Labs: Lab Results  Component Value Date   WBC 7.1 09/24/2019   HGB 13.0 09/24/2019   PLT 245 09/24/2019   NA 143 09/24/2019   K 3.9 09/24/2019   CL 104 09/24/2019   CO2 31 09/24/2019   GLUCOSE 124 (H) 09/24/2019  BUN 19 09/24/2019   CREATININE 1.10 (H) 09/24/2019   BILITOT 0.3 09/24/2019   ALKPHOS 70 09/25/2018   AST 17 09/24/2019   ALT 13 09/24/2019   PROT 6.8 09/24/2019   ALBUMIN 1.8 (L) 09/25/2018   CALCIUM 9.1 09/24/2019   GFRAA 55 (L) 09/24/2019    Speciality Comments: PLQ Eye Exam 04/08/19 WNL @ [facility]  follow up in 6 months  Procedures:  No procedures performed Allergies: Patient has no known allergies.   Assessment / Plan:     Visit Diagnoses: Rheumatoid arthritis, seronegative, multiple sites ([place]) - Erosive disease neg RF,CCP, and ANA: She has no synovitis on exam today.  She has not had any recent rheumatoid arthritis flares.  She is clinically doing well on Plaquenil 200 mg 1 tablet by mouth daily as monotherapy.  She discontinued methotrexate in April 2020 due to recurrent infections.  She has been having increased  arthralgias since discontinuing methotrexate but her discomfort is due to underlying osteoarthritis.  She has no inflammation on exam today.  She will continue taking Plaquenil as prescribed.  She does not need a refill at this time.  She was advised to notify us if she develops increased joint pain or joint swelling.  She will follow-up in the office in 5 months.  High risk medications (not anticoagulants) long-term use - Plaquenil 200 mg 1 tablet by mouth daily. D/c MTX in April 2020 due to recurrent infections-pneumonia x2 in 1 yr. PLQ Eye Exam 04/08/19-Follow up in 6 months.  CBC and CMP were drawn today to monitor for drug toxicity.- Plan: CBC with Differential/Platelet, COMPLETE METABOLIC PANEL WITH GFR  Primary osteoarthritis of both hands: She has PIP DIP and CMC joint thickening consistent with osteoarthritis of both hands.  She has no tenderness or synovitis on exam.  Joint protection and muscle strengthening were discussed.  S/P left knee arthroscopy: She had arthroscopic surgery for a meniscal repair performed by Dr. Wynelle Link on December 2020.  She continues to have discomfort in the left knee.  She has been performing home exercises.  She has slightly limited extension with mild inflammation on exam today.  She continues to follow-up with Dr. Wynelle Link.  Primary osteoarthritis of both knees: She experiences intermittent discomfort in both knee joints.  She had arthroscopic surgery of the left knee for a meniscal repair in December 2020.  She continues to have limited extension and discomfort in the left knee.  She has been following up closely with Dr. Wynelle Link.  Primary osteoarthritis of both feet: She has PIP and DIP thickening consistent with osteoarthritis of both feet.  She is not having any discomfort in her feet at this time.  She wears proper fitting shoes.  Primary osteoarthritis of both hips: She is been experiencing increased pain and stiffness in both hip joints.  She has limited  range of motion of the right hip joint on exam today.  X-rays of the right hip were obtained which were consistent with moderate osteoarthritis.  She was advised to follow-up with Dr. Wynelle Link to discuss treatment options.   Pain in right hip -She presents today with increased discomfort in the right hip.  She has limited range of motion of the right hip joint on exam.  X-rays of the right hip were obtained today which revealed findings consistent with osteoarthritis.  Patient was advised to follow-up with Dr. Wynelle Link to discuss treatment options.  plan: XR HIP UNILAT W OR W/O PELVIS 2-3 VIEWS RIGHT  Trochanteric bursitis  of both hips: She has tenderness over bilateral trochanteric bursa.  She was encouraged to perform stretching exercises on a daily basis.  DDD (degenerative disc disease), lumbar history of lumbar surgery 1970: She continues to have chronic lower back pain and stiffness.  She is not experiencing any symptoms of radiculopathy at this time.  She has no midline spinal tenderness on exam today.  Age-related osteoporosis without current pathological fracture: Patient reports that she was taking Evista for several years for management of osteoporosis.  According to the patient her PCP recently switched her to Fosamax 70 mg 1 tablet by mouth once weekly.  She has had 1 dose of Fosamax so far and tolerated it without any side effects.  She continues to take a calcium and vitamin D supplement on a daily basis.  Other medical conditions are listed as follows:  History of breast cancer  History of anxiety  Orders: Orders Placed This Encounter  Procedures  . XR HIP UNILAT W OR W/O PELVIS 2-3 VIEWS RIGHT  . CBC with Differential/Platelet  . COMPLETE METABOLIC PANEL WITH GFR   No orders of the defined types were placed in this encounter.   Face-to-face time spent with patient was 30 minutes. Greater than 50% of time was spent in counseling and coordination of care.  Follow-Up  Instructions: Return in about 5 months (around 06/14/2020) for Rheumatoid arthritis, Osteoarthritis.   Ofilia Neas, PA-C  Note - This record has been created using Dragon software.  Chart creation errors have been sought, but may not always  have been located. Such creation errors do not reflect on  the standard of medical care.

## 2020-01-13 ENCOUNTER — Other Ambulatory Visit: Payer: Self-pay

## 2020-01-13 ENCOUNTER — Ambulatory Visit: Payer: Self-pay

## 2020-01-13 ENCOUNTER — Encounter: Payer: Self-pay | Admitting: Physician Assistant

## 2020-01-13 ENCOUNTER — Ambulatory Visit: Payer: Medicare Other | Admitting: Physician Assistant

## 2020-01-13 VITALS — BP 149/65 | HR 72 | Resp 16 | Ht 63.0 in | Wt 126.0 lb

## 2020-01-13 DIAGNOSIS — M19072 Primary osteoarthritis, left ankle and foot: Secondary | ICD-10-CM

## 2020-01-13 DIAGNOSIS — M0609 Rheumatoid arthritis without rheumatoid factor, multiple sites: Secondary | ICD-10-CM

## 2020-01-13 DIAGNOSIS — M5136 Other intervertebral disc degeneration, lumbar region: Secondary | ICD-10-CM

## 2020-01-13 DIAGNOSIS — M25551 Pain in right hip: Secondary | ICD-10-CM | POA: Diagnosis not present

## 2020-01-13 DIAGNOSIS — Z8659 Personal history of other mental and behavioral disorders: Secondary | ICD-10-CM

## 2020-01-13 DIAGNOSIS — Z9889 Other specified postprocedural states: Secondary | ICD-10-CM | POA: Diagnosis not present

## 2020-01-13 DIAGNOSIS — M810 Age-related osteoporosis without current pathological fracture: Secondary | ICD-10-CM

## 2020-01-13 DIAGNOSIS — Z853 Personal history of malignant neoplasm of breast: Secondary | ICD-10-CM

## 2020-01-13 DIAGNOSIS — Z79899 Other long term (current) drug therapy: Secondary | ICD-10-CM | POA: Diagnosis not present

## 2020-01-13 DIAGNOSIS — M170 Bilateral primary osteoarthritis of knee: Secondary | ICD-10-CM

## 2020-01-13 DIAGNOSIS — M7061 Trochanteric bursitis, right hip: Secondary | ICD-10-CM

## 2020-01-13 DIAGNOSIS — M19042 Primary osteoarthritis, left hand: Secondary | ICD-10-CM

## 2020-01-13 DIAGNOSIS — M19071 Primary osteoarthritis, right ankle and foot: Secondary | ICD-10-CM

## 2020-01-13 DIAGNOSIS — M19041 Primary osteoarthritis, right hand: Secondary | ICD-10-CM | POA: Diagnosis not present

## 2020-01-13 DIAGNOSIS — M160 Bilateral primary osteoarthritis of hip: Secondary | ICD-10-CM

## 2020-01-13 DIAGNOSIS — M7062 Trochanteric bursitis, left hip: Secondary | ICD-10-CM

## 2020-01-14 LAB — COMPLETE METABOLIC PANEL WITH GFR
AG Ratio: 1.4 (calc) (ref 1.0–2.5)
ALT: 12 U/L (ref 6–29)
AST: 18 U/L (ref 10–35)
Albumin: 4.2 g/dL (ref 3.6–5.1)
Alkaline phosphatase (APISO): 63 U/L (ref 37–153)
BUN: 15 mg/dL (ref 7–25)
CO2: 30 mmol/L (ref 20–32)
Calcium: 9.5 mg/dL (ref 8.6–10.4)
Chloride: 103 mmol/L (ref 98–110)
Creat: 0.68 mg/dL (ref 0.60–0.88)
GFR, Est African American: 96 mL/min/{1.73_m2} (ref >=60)
GFR, Est Non African American: 83 mL/min/{1.73_m2} (ref >=60)
Globulin: 2.9 g/dL (calc) (ref 1.9–3.7)
Glucose, Bld: 81 mg/dL (ref 65–99)
Potassium: 4.8 mmol/L (ref 3.5–5.3)
Sodium: 142 mmol/L (ref 135–146)
Total Bilirubin: 0.4 mg/dL (ref 0.2–1.2)
Total Protein: 7.1 g/dL (ref 6.1–8.1)

## 2020-01-14 LAB — CBC WITH DIFFERENTIAL/PLATELET
Absolute Monocytes: 337 cells/uL (ref 200–950)
Basophils Absolute: 51 cells/uL (ref 0–200)
Basophils Relative: 1 %
Eosinophils Absolute: 148 cells/uL (ref 15–500)
Eosinophils Relative: 2.9 %
HCT: 40.3 % (ref 35.0–45.0)
Hemoglobin: 13.3 g/dL (ref 11.7–15.5)
Lymphs Abs: 1025 cells/uL (ref 850–3900)
MCH: 32.3 pg (ref 27.0–33.0)
MCHC: 33 g/dL (ref 32.0–36.0)
MCV: 97.8 fL (ref 80.0–100.0)
MPV: 10.4 fL (ref 7.5–12.5)
Monocytes Relative: 6.6 %
Neutro Abs: 3539 cells/uL (ref 1500–7800)
Neutrophils Relative %: 69.4 %
Platelets: 237 10*3/uL (ref 140–400)
RBC: 4.12 10*6/uL (ref 3.80–5.10)
RDW: 11.9 % (ref 11.0–15.0)
Total Lymphocyte: 20.1 %
WBC: 5.1 10*3/uL (ref 3.8–10.8)

## 2020-01-14 NOTE — Progress Notes (Signed)
CBC and CMP WNL

## 2020-02-05 ENCOUNTER — Other Ambulatory Visit: Payer: Self-pay | Admitting: Rheumatology

## 2020-02-05 DIAGNOSIS — M0609 Rheumatoid arthritis without rheumatoid factor, multiple sites: Secondary | ICD-10-CM

## 2020-02-05 NOTE — Telephone Encounter (Signed)
Last Visit: 01/13/2020 Next Visit: 06/15/2020 Labs: 01/13/2020 CBC and CMP WNL Eye exam: 04/08/2019  Okay to refill per Dr. Estanislado Pandy.

## 2020-03-09 ENCOUNTER — Other Ambulatory Visit: Payer: Self-pay | Admitting: Rheumatology

## 2020-03-09 DIAGNOSIS — M0609 Rheumatoid arthritis without rheumatoid factor, multiple sites: Secondary | ICD-10-CM

## 2020-03-19 IMAGING — CR DG CHEST 2V
2 series · 2 of 2 positions shown · non-contrast
Comparison: 03/04/2011 CXR and chest CT 04/23/2017

CLINICAL DATA: Dyspnea, cough and fever

EXAM:
CHEST - 2 VIEW

[chest lat]
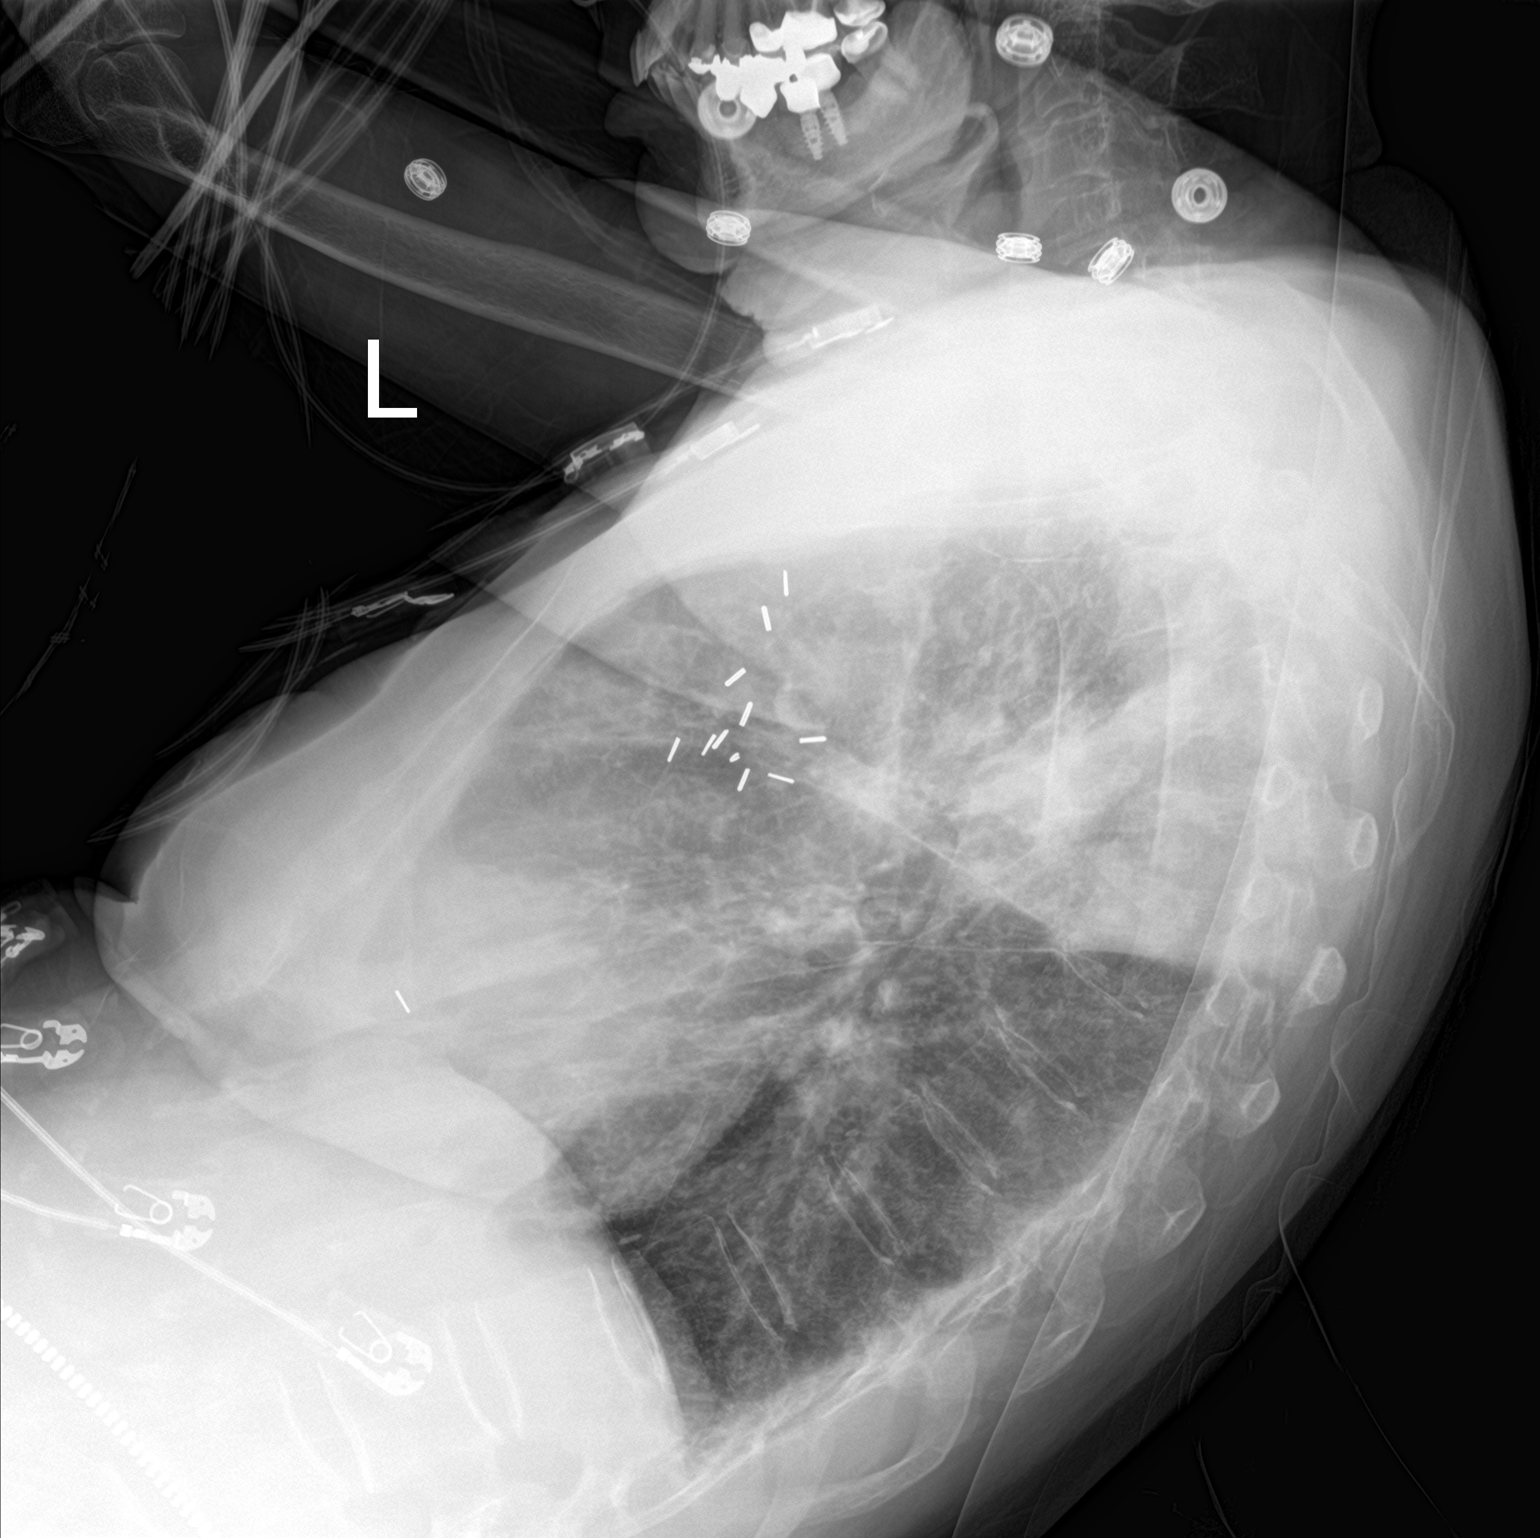

[chest ap]
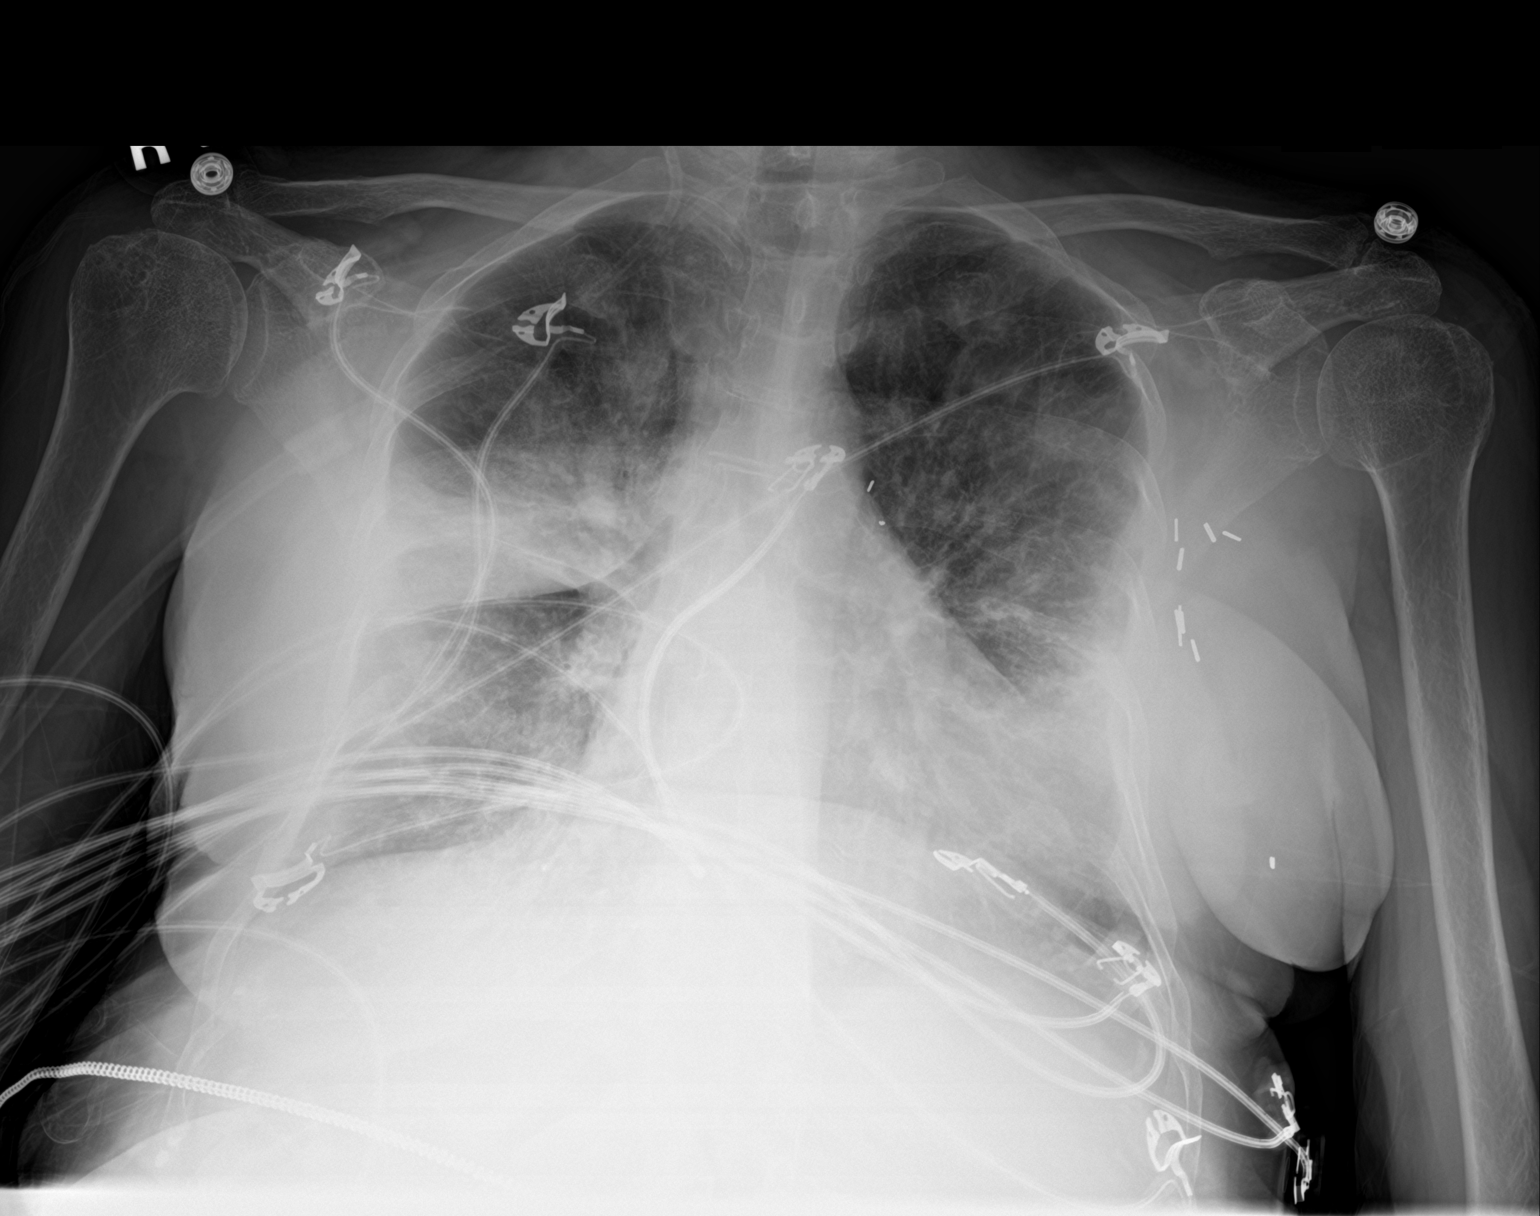

[2 of 2 positions shown; findings below may reference images not displayed]

FINDINGS: Normal heart size and mediastinal contours. Confluent opacity in the
right upper lobe posterior segment suspicious for pneumonia with
more streaky parenchymal opacities likely representing atelectasis
in the upper lobes, lingula and left base. No effusion or
pneumothorax. Osteopenia without acute osseous abnormality.
IMPRESSION: Dominant finding is that of right upper lobe airspace disease
consistent with right upper lobe pneumonia. Followup PA and lateral
chest X-ray is recommended in 3-4 weeks following trial of
antibiotic therapy to ensure resolution and exclude underlying
malignancy.

## 2020-03-23 IMAGING — DX DG CHEST 1V PORT
1 series · 1 of 1 positions shown · non-contrast
Comparison: 09/21/2018.

CLINICAL DATA: Shortness of breath.  Cough.  Pneumonia.

EXAM:
PORTABLE CHEST 1 VIEW

[chest ap]
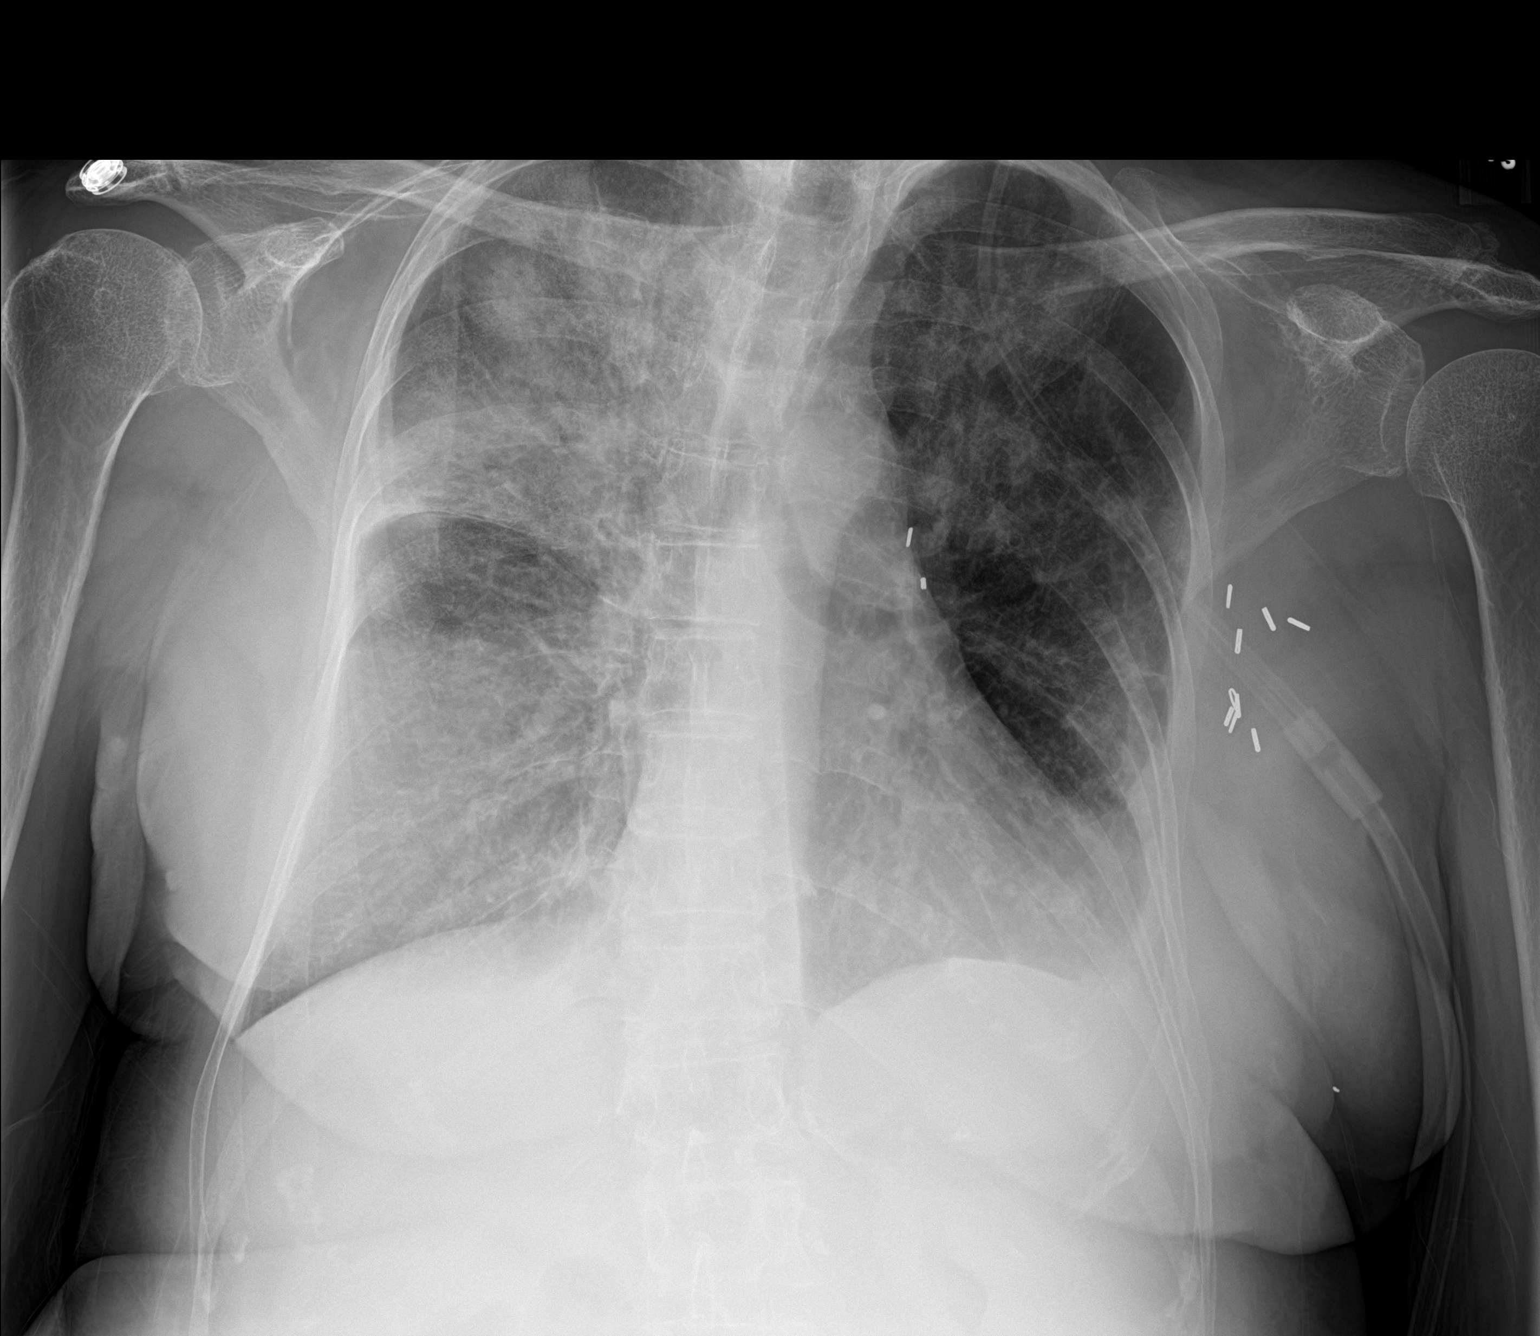

[1 of 1 positions shown; findings below may reference images not displayed]

FINDINGS: Progressive right upper lobe infiltrate noted. Left upper lobe and
right base infiltrates are also noted. Bibasilar atelectasis. Small
left pleural effusion. Heart size normal. Surgical clips noted the
left chest. Degenerative changes scoliosis thoracic spine.
IMPRESSION: Progressive right upper lobe infiltrate. Left upper lobe and right
base infiltrates are also noted. These findings are most consistent
multifocal pneumonia. Small left pleural effusion.

## 2020-03-25 ENCOUNTER — Other Ambulatory Visit: Payer: Self-pay | Admitting: Rheumatology

## 2020-03-25 DIAGNOSIS — M0609 Rheumatoid arthritis without rheumatoid factor, multiple sites: Secondary | ICD-10-CM

## 2020-05-16 ENCOUNTER — Other Ambulatory Visit: Payer: Self-pay | Admitting: Physician Assistant

## 2020-05-18 ENCOUNTER — Telehealth: Payer: Self-pay | Admitting: Rheumatology

## 2020-05-18 ENCOUNTER — Other Ambulatory Visit: Payer: Self-pay | Admitting: Physician Assistant

## 2020-05-18 DIAGNOSIS — M0609 Rheumatoid arthritis without rheumatoid factor, multiple sites: Secondary | ICD-10-CM

## 2020-05-18 MED ORDER — HYDROXYCHLOROQUINE SULFATE 200 MG PO TABS: 200.0000 mg | ORAL_TABLET | Freq: Every day | ORAL | 0 refills | Status: DC

## 2020-05-18 NOTE — Telephone Encounter (Signed)
Last Visit: 01/13/2020 Next Visit: 06/15/2020 Labs: 01/13/2020 CBC and CMP WNL Eye exam:10/07/2019 WNL  Current Dose per office note 01/13/2020: Plaquenil 200 mg 1 tablet by mouth daily  DX: Rheumatoid arthritis, seronegative, multiple sites    Okay to refill per Dr. Estanislado Pandy

## 2020-05-18 NOTE — Telephone Encounter (Signed)
Patient left a voicemail stating she called [facility] to order her prescription of Hydroxychloroquine and was told there are no refills remaining and they need the doctor to send an order in.

## 2020-06-03 NOTE — Progress Notes (Signed)
Office Visit Note  Patient: Megan Porter             Date of Birth: 1941/04/14           MRN: [id number]             PCP: Haywood Pao, MD Referring: Haywood Pao, MD Visit Date: 06/15/2020 Occupation: @GUAROCC @  Subjective:  Left knee joint pain   History of Present Illness: Megan Porter is a 79 y.o. female with history of seronegative rheumatoid arthritis and osteoarthritis.  She is taking plaquenil 200 mg 1 tablet by mouth daily.  She is tolerating Plaquenil without any side effects.  She has not had any recent infections.  She has received both COVID-19 vaccinations and is planning on receiving her booster dose.  She also plans on receiving her annual influenza vaccine at her PCPs office in October 2021. She denies any recent rheumatoid arthritis flares.  She has not been experiencing any discomfort, stiffness, or joint swelling in her hands recently.  She continues to have discomfort and stiffness in her left knee after undergoing arthroscopic surgery in December 2020.  She continues to perform home exercises as recommended by her orthopedist. She has intermittent lower back pain but no symptoms of radiculopathy.  She is taking fosamax 70 mg 1 tablet by mouth once weekly and calcium and vitamin D daily as recommended.  Her PCP continues to monitor her bone densities.    Activities of Daily Living:  Patient reports morning stiffness for 15-30  minutes.   Patient Reports nocturnal pain.  Difficulty dressing/grooming: Denies Difficulty climbing stairs: Reports Difficulty getting out of chair: Reports Difficulty using hands for taps, buttons, cutlery, and/or writing: Denies  Review of Systems  Constitutional: Positive for fatigue.  HENT: Positive for mouth dryness. Negative for mouth sores and nose dryness.   Eyes: Negative for pain, visual disturbance and dryness.  Respiratory: Negative for cough, hemoptysis, shortness of breath and difficulty breathing.     Cardiovascular: Negative for chest pain, palpitations, hypertension and swelling in legs/feet.  Gastrointestinal: Positive for constipation. Negative for blood in stool and diarrhea.  Endocrine: Positive for increased urination.  Genitourinary: Negative for painful urination.  Musculoskeletal: Positive for arthralgias, joint pain and morning stiffness. Negative for joint swelling, myalgias, muscle weakness, muscle tenderness and myalgias.  Skin: Negative for color change, pallor, rash, hair loss, nodules/bumps, skin tightness, ulcers and sensitivity to sunlight.  Allergic/Immunologic: Negative for susceptible to infections.  Neurological: Positive for numbness. Negative for dizziness, headaches and weakness.  Hematological: Negative for swollen glands.  Psychiatric/Behavioral: Positive for sleep disturbance. Negative for depressed mood. The patient is not nervous/anxious.     PMFS History:  Patient Active Problem List   Diagnosis Date Noted  . Abnormal CT scan of lung   . Hypokalemia 09/21/2018  . Benign essential HTN 09/21/2018  . Leucocytosis 09/21/2018  . Pneumonia 09/21/2018  . Bronchiectasis without complication ([place]) 40/97/3532  . Primary osteoarthritis of both knees 04/06/2017  . DDD (degenerative disc disease), lumbar history of lumbar surgery 1970 04/06/2017  . Age-related osteoporosis without current pathological fracture 04/06/2017  . History of anxiety 04/06/2017  . History of breast cancer 04/04/2017  . Primary osteoarthritis of both feet 10/02/2016  . Primary osteoarthritis of both hands 10/02/2016  . High risk medications (not anticoagulants) long-term use 10/02/2016  . Rheumatoid arthritis, seronegative, multiple sites ([place]) 10/02/2016    Past Medical History:  Diagnosis Date  . Arthritis   . Cancer ([place])   .  Osteoarthritis     Family History  Family history unknown: Yes   Past Surgical History:  Procedure Laterality Date  . BACK SURGERY    . BREAST  RECONSTRUCTION    . knee arthroscopic    . VIDEO BRONCHOSCOPY Bilateral 07/06/2019   Procedure: VIDEO BRONCHOSCOPY WITHOUT FLUORO;  Surgeon: Marshell Garfinkel, MD;  Location: [facility];  Service: Cardiopulmonary;  Laterality: Bilateral;   Social History   Social History Narrative  . Not on file   Immunization History  Administered Date(s) Administered  . Fluad Quad(high Dose 65+) 05/29/2019  . Influenza-Unspecified 07/06/2017, 07/24/2018  . PFIZER SARS-COV-2 Vaccination 10/07/2019, 10/28/2019  . Pneumococcal Conjugate-13 04/13/2014  . Pneumococcal Polysaccharide-23 10/13/2018     Objective: Vital Signs: BP (!) 157/85 (BP Location: Left Arm, Patient Position: Sitting, Cuff Size: Normal)   Pulse 81   Resp 14   Ht 5\' 2"  (1.575 m)   Wt 128 lb (58.1 kg)   BMI 23.41 kg/m    Physical Exam Vitals and nursing note reviewed.  Constitutional:      Appearance: She is well-developed.  HENT:     Head: Normocephalic and atraumatic.  Eyes:     Conjunctiva/sclera: Conjunctivae normal.  Pulmonary:     Effort: Pulmonary effort is normal.  Abdominal:     Palpations: Abdomen is soft.  Musculoskeletal:     Cervical back: Normal range of motion.  Skin:    General: Skin is warm and dry.     Capillary Refill: Capillary refill takes less than 2 seconds.  Neurological:     Mental Status: She is alert and oriented to person, place, and time.  Psychiatric:        Behavior: Behavior normal.      Musculoskeletal Exam: C-spine good ROM.  Thoracic kyphosis noted.  No midline spinal tenderness.  No SI joint tenderness.  Shoulder joints, elbow joints, wrist joints, MCPs, PIPs, DIPs have good range of motion with no synovitis.  She has PIP and DIP thickening consistent with osteoarthritis.  Significant CMC joint prominence and thickening noted.  She is able to make a complete fist bilaterally.  Right hip has limited range of motion but no discomfort at this time.  Left hip has good range of motion  with no discomfort.  No tenderness over trochanteric bursa bilaterally.  Right knee has good range of motion with no warmth or effusion. Left knee joint inflammation and crepitus noted.  Ankle joints good ROM with no tenderness or inflammation.  Thickening of both 1st MTP joints.  No tenderness of MTP joints.   CDAI Exam: CDAI Score: 0.4  Patient Global: 2 mm; Provider Global: 2 mm Swollen: 0 ; Tender: 0  Joint Exam 06/15/2020   No joint exam has been documented for this visit   There is currently no information documented on the homunculus. Go to the Rheumatology activity and complete the homunculus joint exam.  Investigation: No additional findings.  Imaging: No results found.  Recent Labs: Lab Results  Component Value Date   WBC 5.1 01/13/2020   HGB 13.3 01/13/2020   PLT 237 01/13/2020   NA 142 01/13/2020   K 4.8 01/13/2020   CL 103 01/13/2020   CO2 30 01/13/2020   GLUCOSE 81 01/13/2020   BUN 15 01/13/2020   CREATININE 0.68 01/13/2020   BILITOT 0.4 01/13/2020   ALKPHOS 70 09/25/2018   AST 18 01/13/2020   ALT 12 01/13/2020   PROT 7.1 01/13/2020   ALBUMIN 1.8 (L) 09/25/2018   CALCIUM  9.5 01/13/2020   GFRAA 96 01/13/2020    Speciality Comments: PLQ Eye Exam 10/07/2019 WNL @ [facility]  follow up in 6 months  Procedures:  No procedures performed Allergies: Patient has no known allergies.   Assessment / Plan:     Visit Diagnoses: Rheumatoid arthritis, seronegative, multiple sites ([place]) - Erosive disease neg RF,CCP, and ANA: She has no joint tenderness or synovitis on exam.  She has not had any recent rheumatoid arthritis flares.  She is clinically been doing well on Plaquenil 200 mg 1 tablet by mouth daily.  She is tolerating Plaquenil without any side effects.  She experiences intermittent arthralgias due to underlying osteoarthritis.  She has no joint inflammation on examination today.  She will continue taking plaquenil 200 mg 1 tablet by mouth daily.  She does  not need any refills at this time.  She was advised to notify us if she develops increased joint pain or joint swelling. She will follow up in 5 months.   High risk medications (not anticoagulants) long-term use - Plaquenil 200 mg 1 tablet by mouth daily. D/c MTX in April 2020 due to recurrent infections. PLQ Eye Exam 10/07/2019.  CBC and CMP WNL on 01/13/20.  She is due to update lab work today.  Orders for CBC and CMP were released.    - Plan: COMPLETE METABOLIC PANEL WITH GFR, CBC with Differential/Platelet She has received both covid-19 vaccinations and is planning on her receiving her booster at her PCPs office.  She will also be receiving her annual influenza vaccination in October 2021.   She was advised to notify us or her PCP if she develops a COVID-19 infection in order to receive the antibody infusion.  She was encouraged to continue to wear a mask and social distance.  She voiced understanding.  Primary osteoarthritis of both hands: She has PIP and DIP thickening consistent with osteoarthritis of both hands.  CMC joint prominence and thickening bilaterally.  She has no joint tenderness or inflammation on exam.  She is able to make a complete fist bilaterally.  Joint protection and muscle strengthening were discussed.  S/P left knee arthroscopy: December 2020-She continues to have discomfort and stiffness in her left knee joint.  She has good ROM on exam with crepitus.  She has mild inflammation but no joint effusion.  She was encouraged to perform lower extremity muscle strengthening and knee joint exercises.  Primary osteoarthritis of both knees: She has good ROM of both knee joints.  Left knee crepitus noted. She continues to have discomfort and stiffness in her left knee joint s/p arthroscopic meniscal repair in December 2020.  She was encouraged to continue to perform knee exercises.    Primary osteoarthritis of both feet: She has PIP and DIP thickening consistent with osteoarthritis of  both feet.  Bunions noted bilaterally. She is wearing proper fitting shoes.   Primary osteoarthritis of both hips: She has limited ROM of the right hip joint but no discomfort.  Left hip has good ROM with no discomfort.    Trochanteric bursitis of both hips: Resolved. She has no tenderness to palpation on exam.   DDD (degenerative disc disease), lumbar history of lumbar surgery 1970: She experiences intermittent discomfort in her lower back.  She has no symptoms of radiculopathy at this time.  No midline spinal tenderness noted.   Age-related osteoporosis without current pathological fracture - DEXA ordered by PCP.  Records not in epic.  She is currently taking fosamax 70  mg 1 tablet by mouth once weekly and continues to take a calcium and vitamin D supplement as recommended.  She has not had any recent falls or fractures.  Thoracic kyphosis noted.  No midline spinal tenderness.   Other medical conditions are listed as follows:   History of anxiety  History of breast cancer  Orders: Orders Placed This Encounter  Procedures  . COMPLETE METABOLIC PANEL WITH GFR  . CBC with Differential/Platelet   No orders of the defined types were placed in this encounter.     Follow-Up Instructions: Return in about 5 months (around 11/14/2020) for Rheumatoid arthritis, Osteoarthritis.   Ofilia Neas, PA-C  Note - This record has been created using Dragon software.  Chart creation errors have been sought, but may not always  have been located. Such creation errors do not reflect on  the standard of medical care.

## 2020-06-15 ENCOUNTER — Encounter: Payer: Self-pay | Admitting: Physician Assistant

## 2020-06-15 ENCOUNTER — Ambulatory Visit: Payer: Medicare Other | Admitting: Physician Assistant

## 2020-06-15 ENCOUNTER — Other Ambulatory Visit: Payer: Self-pay

## 2020-06-15 VITALS — BP 157/85 | HR 81 | Resp 14 | Ht 62.0 in | Wt 128.0 lb

## 2020-06-15 DIAGNOSIS — M19041 Primary osteoarthritis, right hand: Secondary | ICD-10-CM | POA: Diagnosis not present

## 2020-06-15 DIAGNOSIS — Z9889 Other specified postprocedural states: Secondary | ICD-10-CM

## 2020-06-15 DIAGNOSIS — M19072 Primary osteoarthritis, left ankle and foot: Secondary | ICD-10-CM

## 2020-06-15 DIAGNOSIS — M0609 Rheumatoid arthritis without rheumatoid factor, multiple sites: Secondary | ICD-10-CM | POA: Diagnosis not present

## 2020-06-15 DIAGNOSIS — M5136 Other intervertebral disc degeneration, lumbar region: Secondary | ICD-10-CM

## 2020-06-15 DIAGNOSIS — Z8659 Personal history of other mental and behavioral disorders: Secondary | ICD-10-CM

## 2020-06-15 DIAGNOSIS — M7062 Trochanteric bursitis, left hip: Secondary | ICD-10-CM

## 2020-06-15 DIAGNOSIS — Z853 Personal history of malignant neoplasm of breast: Secondary | ICD-10-CM

## 2020-06-15 DIAGNOSIS — M19042 Primary osteoarthritis, left hand: Secondary | ICD-10-CM

## 2020-06-15 DIAGNOSIS — M7061 Trochanteric bursitis, right hip: Secondary | ICD-10-CM

## 2020-06-15 DIAGNOSIS — M160 Bilateral primary osteoarthritis of hip: Secondary | ICD-10-CM

## 2020-06-15 DIAGNOSIS — M19071 Primary osteoarthritis, right ankle and foot: Secondary | ICD-10-CM

## 2020-06-15 DIAGNOSIS — M810 Age-related osteoporosis without current pathological fracture: Secondary | ICD-10-CM

## 2020-06-15 DIAGNOSIS — M170 Bilateral primary osteoarthritis of knee: Secondary | ICD-10-CM

## 2020-06-15 DIAGNOSIS — Z79899 Other long term (current) drug therapy: Secondary | ICD-10-CM

## 2020-06-15 NOTE — Patient Instructions (Signed)

## 2020-06-16 LAB — COMPLETE METABOLIC PANEL WITH GFR
AG Ratio: 1.8 (calc) (ref 1.0–2.5)
ALT: 14 U/L (ref 6–29)
AST: 18 U/L (ref 10–35)
Albumin: 4.2 g/dL (ref 3.6–5.1)
Alkaline phosphatase (APISO): 58 U/L (ref 37–153)
BUN: 18 mg/dL (ref 7–25)
CO2: 31 mmol/L (ref 20–32)
Calcium: 9.2 mg/dL (ref 8.6–10.4)
Chloride: 104 mmol/L (ref 98–110)
Creat: 0.75 mg/dL (ref 0.60–0.88)
GFR, Est African American: 87 mL/min/{1.73_m2} (ref >=60)
GFR, Est Non African American: 75 mL/min/{1.73_m2} (ref >=60)
Globulin: 2.4 g/dL (calc) (ref 1.9–3.7)
Glucose, Bld: 73 mg/dL (ref 65–99)
Potassium: 4.6 mmol/L (ref 3.5–5.3)
Sodium: 142 mmol/L (ref 135–146)
Total Bilirubin: 0.4 mg/dL (ref 0.2–1.2)
Total Protein: 6.6 g/dL (ref 6.1–8.1)

## 2020-06-16 LAB — CBC WITH DIFFERENTIAL/PLATELET
Absolute Monocytes: 355 cells/uL (ref 200–950)
Basophils Absolute: 38 cells/uL (ref 0–200)
Basophils Relative: 0.8 %
Eosinophils Absolute: 120 cells/uL (ref 15–500)
Eosinophils Relative: 2.5 %
HCT: 40.7 % (ref 35.0–45.0)
Hemoglobin: 13.5 g/dL (ref 11.7–15.5)
Lymphs Abs: 960 cells/uL (ref 850–3900)
MCH: 32.5 pg (ref 27.0–33.0)
MCHC: 33.2 g/dL (ref 32.0–36.0)
MCV: 98.1 fL (ref 80.0–100.0)
MPV: 10.5 fL (ref 7.5–12.5)
Monocytes Relative: 7.4 %
Neutro Abs: 3326 cells/uL (ref 1500–7800)
Neutrophils Relative %: 69.3 %
Platelets: 191 10*3/uL (ref 140–400)
RBC: 4.15 10*6/uL (ref 3.80–5.10)
RDW: 12.3 % (ref 11.0–15.0)
Total Lymphocyte: 20 %
WBC: 4.8 10*3/uL (ref 3.8–10.8)

## 2020-06-16 NOTE — Progress Notes (Signed)
CBC and CMP WNL

## 2020-07-12 ENCOUNTER — Other Ambulatory Visit: Payer: Self-pay | Admitting: Rheumatology

## 2020-07-12 DIAGNOSIS — M0609 Rheumatoid arthritis without rheumatoid factor, multiple sites: Secondary | ICD-10-CM

## 2020-07-28 ENCOUNTER — Other Ambulatory Visit: Payer: Self-pay | Admitting: Rheumatology

## 2020-07-28 DIAGNOSIS — M0609 Rheumatoid arthritis without rheumatoid factor, multiple sites: Secondary | ICD-10-CM

## 2020-08-31 ENCOUNTER — Other Ambulatory Visit: Payer: Self-pay | Admitting: Rheumatology

## 2020-08-31 ENCOUNTER — Telehealth: Payer: Self-pay

## 2020-08-31 DIAGNOSIS — M0609 Rheumatoid arthritis without rheumatoid factor, multiple sites: Secondary | ICD-10-CM

## 2020-08-31 NOTE — Telephone Encounter (Signed)
Last Visit: 06/15/2020 Next Visit: due February 2022. Labs: 06/15/2020 CBC and CMP WNL Eye exam:  10/07/2019 WNL  Current Dose per office note 06/15/2020: Plaquenil 200 mg 1 tablet by mouth daily UI:[id number] arthritis, seronegative, multiple sites   Okay to refill per Dr. Estanislado Pandy

## 2020-08-31 NOTE — Telephone Encounter (Signed)
Patient called requesting prescription refill of Hydroxychloroquine to be sent to [facility].   

## 2020-08-31 NOTE — Telephone Encounter (Signed)
Prescription has been sent to the pharmacy. 

## 2020-10-25 ENCOUNTER — Other Ambulatory Visit: Payer: Self-pay | Admitting: Rheumatology

## 2020-10-25 DIAGNOSIS — M0609 Rheumatoid arthritis without rheumatoid factor, multiple sites: Secondary | ICD-10-CM

## 2020-11-09 ENCOUNTER — Other Ambulatory Visit: Payer: Self-pay | Admitting: Rheumatology

## 2020-11-09 DIAGNOSIS — M0609 Rheumatoid arthritis without rheumatoid factor, multiple sites: Secondary | ICD-10-CM

## 2020-11-09 DIAGNOSIS — Z79899 Other long term (current) drug therapy: Secondary | ICD-10-CM

## 2020-11-09 NOTE — Telephone Encounter (Signed)
Please call patient to schedule appt, Return in about 5 months (around 11/14/2020) for Rheumatoid arthritis, Osteoarthritis.  Thank you.

## 2020-11-09 NOTE — Telephone Encounter (Addendum)
Last Visit: 06/15/2020  Next Visit: message sent to front desk to schedule appt, Return in about 5 months (around 11/14/2020) for Rheumatoid arthritis, Osteoarthritis.   Labs: 06/15/2020, CBC and CMP WNL, called patient to have labs drawn, patient out of town and will have labs drawn when she returns 11/24/2020 Eye exam: 08/23/2020  Current Dose per office note 06/15/2020, Plaquenil 200 mg 1 tablet by mouth daily DX: Rheumatoid arthritis, seronegative, multiple sites   Last Fill: 08/31/2020  Okay to refill Plaquenil?

## 2020-11-09 NOTE — Telephone Encounter (Signed)
Called patient to schedule 5 month follow-up appointment.  Patient states she is in [place] and is not sure what month they are returning to [place].  Patient states she will call to schedule when she returns.

## 2020-12-29 NOTE — Progress Notes (Signed)
Office Visit Note  Patient: Megan Porter             Date of Birth: April 22, 1941           MRN: [id number]             PCP: Haywood Pao, MD Referring: Haywood Pao, MD Visit Date: 01/04/2021 Occupation: @GUAROCC @  Subjective:  Medication monitoring   History of Present Illness: Megan Porter is a 80 y.o. female with history of seronegative rheumatoid arthritis and osteoarthritis.  She is taking plaquenil 200 mg 1 tablet by mouth daily.  She continues to tolerate Plaquenil without any side effects.  She denies any recent rheumatoid arthritis flares.  According to the patient she fell down the steps about 2 months ago and sustained a "hairline fracture" in her right wrist.  She was evaluated by Dr. Jeannie Fend and was placed in a cast for 8 weeks.  She states that her right wrist pain has completely resolved but about 2 weeks after having the cast removed she was experiencing increased pain in her right hand.  She was also experiencing intermittent numbness especially at night in her right hand.  Her symptoms have gradually started to improve.  The last episode of numbness was about 1.5 weeks ago.  She has an upcoming appointment with Dr. Jeannie Fend next week for routine follow-up.  She denies any other joint pain or joint swelling at this time. She reports that she has not been compliant taking Fosamax due to difficulty remembering to take it on a weekly basis and having to space it with her Synthroid dose.  She does not want to continue on Fosamax if there are other treatment options available.  She has continued to take calcium and vitamin D as recommended.  She plans on following up with her PCP to discuss other options besides Fosamax going forward.  In the past she was on evista for several years.    Activities of Daily Living:  Patient reports morning stiffness for 1 hour.   Patient Denies nocturnal pain.  Difficulty dressing/grooming: Denies Difficulty climbing stairs:  Denies Difficulty getting out of chair: Denies Difficulty using hands for taps, buttons, cutlery, and/or writing: Reports  Review of Systems  Constitutional: Positive for fatigue.  HENT: Positive for mouth dryness. Negative for mouth sores and nose dryness.   Eyes: Negative for pain, itching, visual disturbance and dryness.  Respiratory: Negative for cough, hemoptysis, shortness of breath and difficulty breathing.   Cardiovascular: Negative for chest pain, palpitations and swelling in legs/feet.  Gastrointestinal: Positive for constipation. Negative for abdominal pain, blood in stool and diarrhea.  Endocrine: Negative for increased urination.  Genitourinary: Negative for painful urination.  Musculoskeletal: Positive for arthralgias, joint pain, joint swelling and morning stiffness. Negative for myalgias, muscle weakness, muscle tenderness and myalgias.  Skin: Negative for color change, rash and redness.  Allergic/Immunologic: Negative for susceptible to infections.  Neurological: Positive for numbness. Negative for dizziness, headaches, memory loss and weakness.  Hematological: Negative for swollen glands.  Psychiatric/Behavioral: Positive for sleep disturbance. Negative for confusion.    PMFS History:  Patient Active Problem List   Diagnosis Date Noted  . Abnormal CT scan of lung   . Hypokalemia 09/21/2018  . Benign essential HTN 09/21/2018  . Leucocytosis 09/21/2018  . Pneumonia 09/21/2018  . Bronchiectasis without complication ([place]) 84/66/5993  . Primary osteoarthritis of both knees 04/06/2017  . DDD (degenerative disc disease), lumbar history of lumbar surgery 1970 04/06/2017  .  Age-related osteoporosis without current pathological fracture 04/06/2017  . History of anxiety 04/06/2017  . History of breast cancer 04/04/2017  . Primary osteoarthritis of both feet 10/02/2016  . Primary osteoarthritis of both hands 10/02/2016  . High risk medications (not anticoagulants) long-term  use 10/02/2016  . Rheumatoid arthritis, seronegative, multiple sites () 10/02/2016    Past Medical History:  Diagnosis Date  . Arthritis   . Cancer ([place])   . Osteoarthritis     Family History  Family history unknown: Yes   Past Surgical History:  Procedure Laterality Date  . BACK SURGERY    . BREAST RECONSTRUCTION    . knee arthroscopic    . VIDEO BRONCHOSCOPY Bilateral 07/06/2019   Procedure: VIDEO BRONCHOSCOPY WITHOUT FLUORO;  Surgeon: Marshell Garfinkel, MD;  Location: [facility];  Service: Cardiopulmonary;  Laterality: Bilateral;   Social History   Social History Narrative  . Not on file   Immunization History  Administered Date(s) Administered  . Fluad Quad(high Dose 65+) 05/29/2019  . Influenza-Unspecified 07/06/2017, 07/24/2018  . PFIZER(Purple Top)SARS-COV-2 Vaccination 10/07/2019, 10/28/2019, 06/28/2020  . Pneumococcal Conjugate-13 04/13/2014  . Pneumococcal Polysaccharide-23 10/13/2018     Objective: Vital Signs: BP (!) 148/81 (BP Location: Left Arm, Patient Position: Sitting, Cuff Size: Normal)   Pulse 89   Ht 5\' 3"  (1.6 m)   Wt 122 lb 12.8 oz (55.7 kg)   BMI 21.75 kg/m    Physical Exam Vitals and nursing note reviewed.  Constitutional:      Appearance: She is well-developed.  HENT:     Head: Normocephalic and atraumatic.  Eyes:     Conjunctiva/sclera: Conjunctivae normal.  Pulmonary:     Effort: Pulmonary effort is normal.  Abdominal:     Palpations: Abdomen is soft.  Musculoskeletal:     Cervical back: Normal range of motion.  Skin:    General: Skin is warm and dry.     Capillary Refill: Capillary refill takes less than 2 seconds.  Neurological:     Mental Status: She is alert and oriented to person, place, and time.  Psychiatric:        Behavior: Behavior normal.      Musculoskeletal Exam: C-spine has slightly limited ROM with lateral rotation.  Shoulder joints, elbow joints, wrist joints, MCPs, PIPs, and DIPs good ROM with no  synovitis.  PIP and DIP thickening consistent with osteoarthritis of both hands.  Excello joint prominence bilaterally.  Subluxation of the 1st MCP joint of the right hand.  Complete fist formation bilaterally.  Right hip has limited ROM. Left hip has good ROM with no discomfort.  Knee joints have good ROM with no discomfort.  No warmth or effusion of knee joints.  Ankle joints good ROM with no tenderness or swelling.   CDAI Exam: CDAI Score: 0  Patient Global: 0 mm; Provider Global: 0 mm Swollen: 0 ; Tender: 0  Joint Exam 01/04/2021   No joint exam has been documented for this visit   There is currently no information documented on the homunculus. Go to the Rheumatology activity and complete the homunculus joint exam.  Investigation: No additional findings.  Imaging: No results found.  Recent Labs: Lab Results  Component Value Date   WBC 4.8 06/15/2020   HGB 13.5 06/15/2020   PLT 191 06/15/2020   NA 142 06/15/2020   K 4.6 06/15/2020   CL 104 06/15/2020   CO2 31 06/15/2020   GLUCOSE 73 06/15/2020   BUN 18 06/15/2020   CREATININE 0.75 06/15/2020  BILITOT 0.4 06/15/2020   ALKPHOS 70 09/25/2018   AST 18 06/15/2020   ALT 14 06/15/2020   PROT 6.6 06/15/2020   ALBUMIN 1.8 (L) 09/25/2018   CALCIUM 9.2 06/15/2020   GFRAA 87 06/15/2020    Speciality Comments: PLQ Eye Exam 08/23/2020 OCT was normal, VF was unreliable.  @ [facility]  follow up in 6 months  Procedures:  No procedures performed Allergies: Patient has no known allergies.       Assessment / Plan:     Visit Diagnoses: Rheumatoid arthritis, seronegative, multiple sites ([place]) - Erosive disease neg RF,CCP, and ANA: She has no joint tenderness or synovitis.  She has not had any recent rheumatoid arthritis flares.  She is clinically doing well taking plaquenil 200 mg 1 tablet by mouth daily.  She experiences intermittent pain and stiffness in both hands due to underlying osteoarthritis.  [place] joint prominence as well  as PIP and DIP thickening were noted on examination today but no inflammation was apparent.  We discussed the importance of joint protection and muscle strengthening.  We also discussed the use of arthritis compression gloves.  She will continue on Plaquenil as monotherapy.  A refill of Plaquenil sent to the pharmacy.  She was advised to notify us if she develops increased joint pain or joint swelling.  She will follow-up in the office in 5 months.  High risk medications (not anticoagulants) long-term use - Plaquenil 200 mg 1 tablet by mouth daily. D/c MTX in April 2020 due to recurrent infections.  PLQ Eye Exam 08/23/2020 OCT was normal, VF was unreliable. @ [facility] follow up in 6 months.  CBC and CMP within normal limits on 06/15/2020.  She is due to update lab work.  Orders for CBC and CMP were released.  She will continue to require lab work every 5 months.- Plan: CBC with Differential/Platelet, COMPLETE METABOLIC PANEL WITH GFR She has not had any recent infections.  She has received 3 Pfizer COVID-19 vaccine doses.   Primary osteoarthritis of both hands: She has PIP and DIP thickening consistent with osteoarthritis of both hands.  CMC joint prominence and thickening noted bilaterally.  Subluxation of the right first MCP joint noted.  She is able to make a complete fist bilaterally.  We discussed the importance of joint protection and muscle strengthening.  She was encouraged to perform hand exercises on a daily basis.  We also discussed the use of arthritis compression gloves.  S/P left knee arthroscopy: History of meniscal repair December 2020.  She has good range of motion of the left knee joint with some crepitus but no warmth or effusion was noted.  Primary osteoarthritis of both knees: She has good range of motion in both knee joints on exam.  Bilateral knee crepitus noted.  No warmth or effusion was noted.  Primary osteoarthritis of both feet: She is not experiencing any discomfort in  her feet at this time.  She has good range of motion of both ankle joints with no tenderness or inflammation.  Bunions noted bilaterally.  Primary osteoarthritis of both hips: She has limited range of motion of the right hip with no discomfort.  Left hip has full range of motion with no discomfort.  No tenderness over trochanteric bursa bilaterally.  She has not been experiencing any groin pain recently.  She has no difficulty getting in and out of the car.  Trochanteric bursitis of both hips: Resolved.  She has no tenderness palpation on examination today.  DDD (degenerative disc disease), lumbar history of lumbar surgery 1970: She experiences occasional discomfort in her lower back.  She is not having any symptoms of radiculopathy at this time.  Age-related osteoporosis without current pathological fracture - DEXA ordered by PCP.  Records not in epic.  She is currently taking fosamax 70 mg 1 tablet by mouth once weekly and continues to take a calcium and vitamin D supplements daily as recommended.  She has not been taking Fosamax on a regular basis due to not tolerating it as well as having difficulty remembering to take it and spacing the dose around her Synthroid dose on a weekly basis.  She may be a good candidate for IV Reclast or Prolia in the future to increase her compliance.  She plans on discussing different treatment options with her PCP since she would like to discontinue Fosamax.  I discussed that due to her recent wrist fracture it is wise to stay on treatment for osteoporosis at this time.    Other closed fracture of distal end of right radius with routine healing, subsequent encounter: Patient she fell down the stairs about 2 months ago and landed on her right wrist.  She was initially evaluated by Dr. Jeannie Fend in February 2022.  She sustained a closed fracture of the distal end of the radius and was placed in a cast for 8 weeks.  According to the patient the fracture has healed and she  is not having any residual discomfort or stiffness in her right wrist.  She had no tenderness palpation on examination today. About 2 weeks after having the cast removed she was experiencing intermittent numbness and pain in her right hand especially at night.  Her discomfort has gradually started to improve.  She was advised to notify us if the numbness persists or worsens.  Other medical conditions are listed as follows:   History of anxiety  History of breast cancer  Orders: Orders Placed This Encounter  Procedures  . CBC with Differential/Platelet  . COMPLETE METABOLIC PANEL WITH GFR   No orders of the defined types were placed in this encounter.    Follow-Up Instructions: Return in about 5 months (around 06/06/2021) for Rheumatoid arthritis, Osteoarthritis.   Ofilia Neas, PA-C  Note - This record has been created using Dragon software.  Chart creation errors have been sought, but may not always  have been located. Such creation errors do not reflect on  the standard of medical care.

## 2021-01-01 IMAGING — DX DG CHEST 1V PORT
1 series · 1 of 1 positions shown · non-contrast
Comparison: CT chest 02/25/2019. PA and lateral chest 10/13/2018.
Single-view of the chest 09/25/2018.

CLINICAL DATA: Status post bronchoalveolar lavage today. Cough and
shortness of breath after the procedure.

EXAM:
PORTABLE CHEST 1 VIEW

[chest ap]
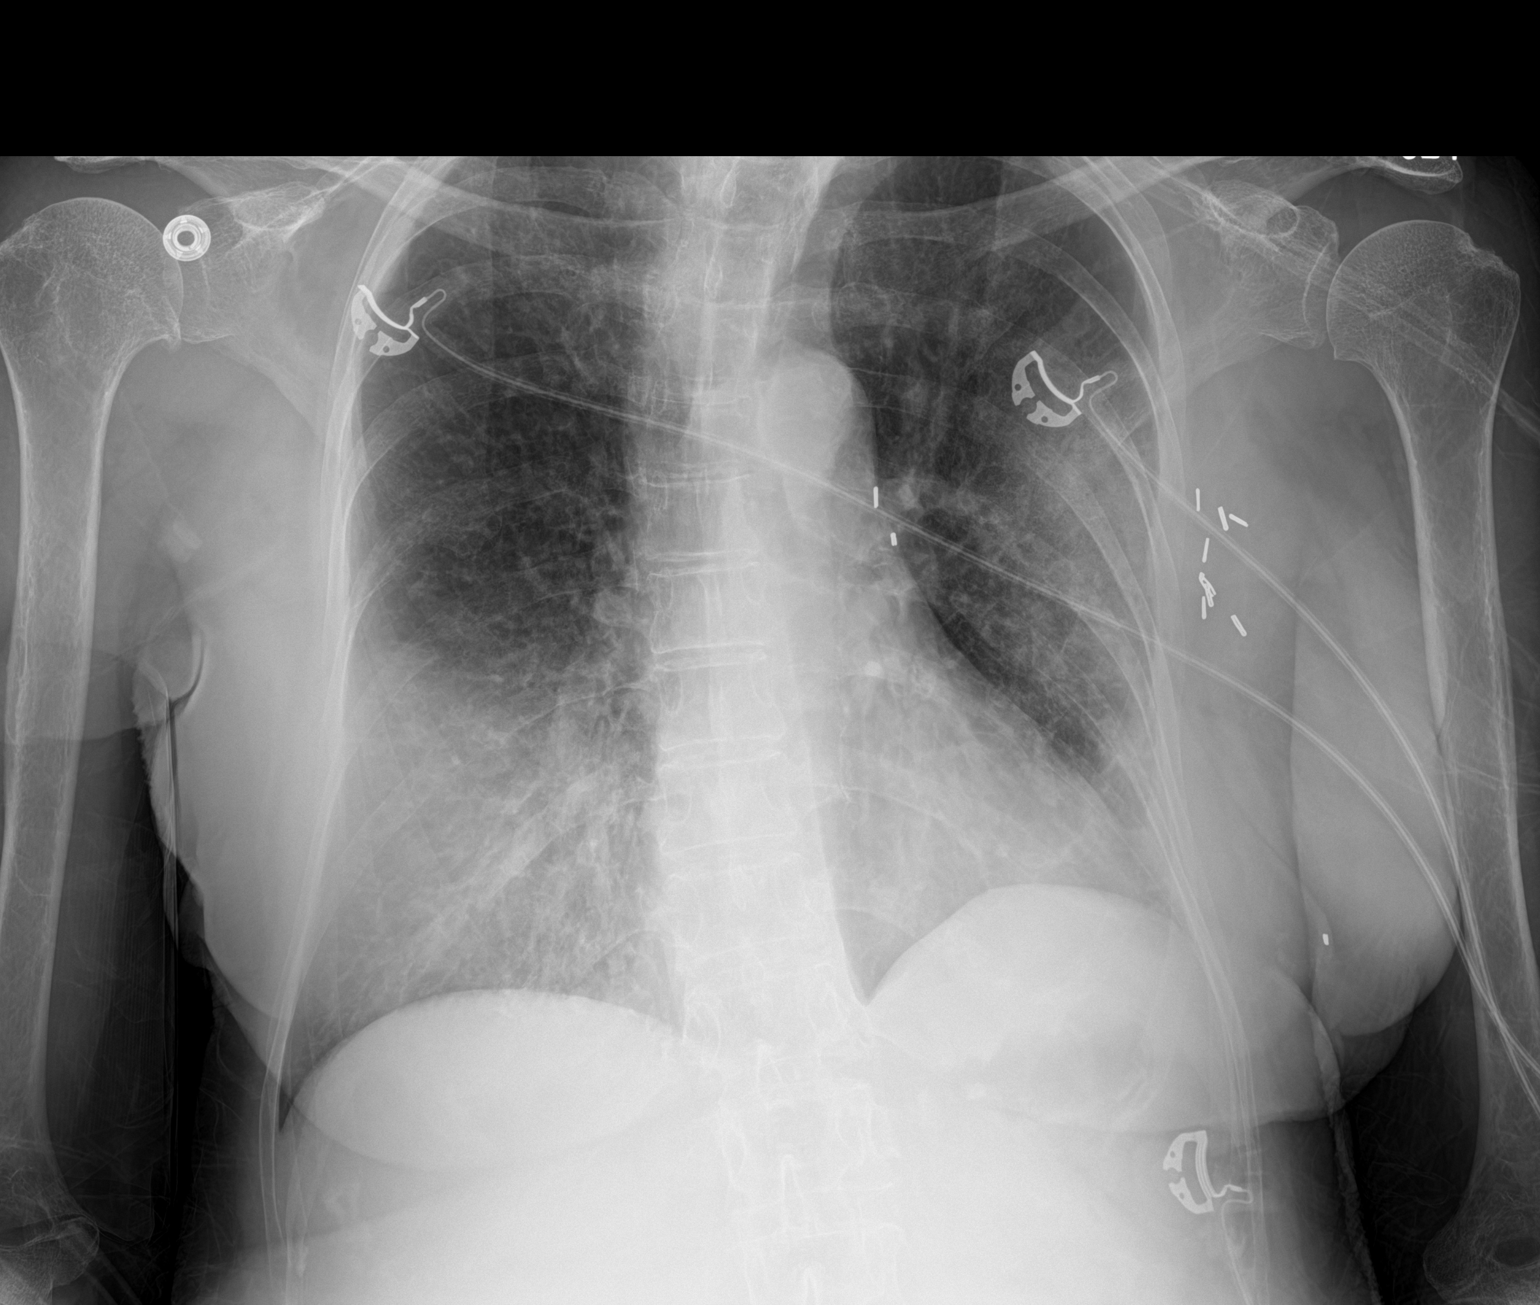

[1 of 1 positions shown; findings below may reference images not displayed]

FINDINGS: There is no pneumothorax or pleural effusion after lavaged. Hazy
left upper lobe opacity is consistent with lavage and possibly
pneumonia. Hazy opacity is also present in the right mid and lower
lung zones. Heart size is normal. No atherosclerosis.
IMPRESSION: Negative for pneumothorax.

Hazy left upper and right lower lobe

## 2021-01-03 ENCOUNTER — Other Ambulatory Visit: Payer: Self-pay | Admitting: Physician Assistant

## 2021-01-03 DIAGNOSIS — Z79899 Other long term (current) drug therapy: Secondary | ICD-10-CM

## 2021-01-03 DIAGNOSIS — M0609 Rheumatoid arthritis without rheumatoid factor, multiple sites: Secondary | ICD-10-CM

## 2021-01-04 ENCOUNTER — Encounter: Payer: Self-pay | Admitting: Physician Assistant

## 2021-01-04 ENCOUNTER — Other Ambulatory Visit: Payer: Self-pay

## 2021-01-04 ENCOUNTER — Ambulatory Visit: Payer: Medicare Other | Admitting: Physician Assistant

## 2021-01-04 VITALS — BP 148/81 | HR 89 | Ht 63.0 in | Wt 122.8 lb

## 2021-01-04 DIAGNOSIS — Z9889 Other specified postprocedural states: Secondary | ICD-10-CM | POA: Diagnosis not present

## 2021-01-04 DIAGNOSIS — Z79899 Other long term (current) drug therapy: Secondary | ICD-10-CM | POA: Diagnosis not present

## 2021-01-04 DIAGNOSIS — M810 Age-related osteoporosis without current pathological fracture: Secondary | ICD-10-CM

## 2021-01-04 DIAGNOSIS — S52591D Other fractures of lower end of right radius, subsequent encounter for closed fracture with routine healing: Secondary | ICD-10-CM

## 2021-01-04 DIAGNOSIS — M19041 Primary osteoarthritis, right hand: Secondary | ICD-10-CM

## 2021-01-04 DIAGNOSIS — M19071 Primary osteoarthritis, right ankle and foot: Secondary | ICD-10-CM

## 2021-01-04 DIAGNOSIS — M19042 Primary osteoarthritis, left hand: Secondary | ICD-10-CM

## 2021-01-04 DIAGNOSIS — Z8659 Personal history of other mental and behavioral disorders: Secondary | ICD-10-CM

## 2021-01-04 DIAGNOSIS — M160 Bilateral primary osteoarthritis of hip: Secondary | ICD-10-CM

## 2021-01-04 DIAGNOSIS — M5136 Other intervertebral disc degeneration, lumbar region: Secondary | ICD-10-CM

## 2021-01-04 DIAGNOSIS — M7061 Trochanteric bursitis, right hip: Secondary | ICD-10-CM

## 2021-01-04 DIAGNOSIS — M19072 Primary osteoarthritis, left ankle and foot: Secondary | ICD-10-CM

## 2021-01-04 DIAGNOSIS — M0609 Rheumatoid arthritis without rheumatoid factor, multiple sites: Secondary | ICD-10-CM | POA: Diagnosis not present

## 2021-01-04 DIAGNOSIS — M7062 Trochanteric bursitis, left hip: Secondary | ICD-10-CM

## 2021-01-04 DIAGNOSIS — Z853 Personal history of malignant neoplasm of breast: Secondary | ICD-10-CM

## 2021-01-04 DIAGNOSIS — M170 Bilateral primary osteoarthritis of knee: Secondary | ICD-10-CM

## 2021-01-04 NOTE — Telephone Encounter (Signed)
Last Visit: 06/15/2020 Next Visit: 4/202/2022 Labs: 01/03/2021 pending Eye exam: 08/23/2020  Current Dose per office note 06/15/2020, Plaquenil 200 mg 1 tablet by mouth daily.  DX: Rheumatoid arthritis, seronegative, multiple sites  Last Fill: 11/09/2020  Okay to refill Plaquenil?

## 2021-01-05 LAB — CBC WITH DIFFERENTIAL/PLATELET
Absolute Monocytes: 410 cells/uL (ref 200–950)
Basophils Absolute: 50 cells/uL (ref 0–200)
Basophils Relative: 0.8 %
Eosinophils Absolute: 69 cells/uL (ref 15–500)
Eosinophils Relative: 1.1 %
HCT: 40.2 % (ref 35.0–45.0)
Hemoglobin: 13.7 g/dL (ref 11.7–15.5)
Lymphs Abs: 1096 cells/uL (ref 850–3900)
MCH: 33.1 pg — ABNORMAL HIGH (ref 27.0–33.0)
MCHC: 34.1 g/dL (ref 32.0–36.0)
MCV: 97.1 fL (ref 80.0–100.0)
MPV: 10.2 fL (ref 7.5–12.5)
Monocytes Relative: 6.5 %
Neutro Abs: 4675 cells/uL (ref 1500–7800)
Neutrophils Relative %: 74.2 %
Platelets: 244 10*3/uL (ref 140–400)
RBC: 4.14 10*6/uL (ref 3.80–5.10)
RDW: 12.3 % (ref 11.0–15.0)
Total Lymphocyte: 17.4 %
WBC: 6.3 10*3/uL (ref 3.8–10.8)

## 2021-01-05 LAB — COMPLETE METABOLIC PANEL WITH GFR
AG Ratio: 1.7 (calc) (ref 1.0–2.5)
ALT: 19 U/L (ref 6–29)
AST: 25 U/L (ref 10–35)
Albumin: 4.3 g/dL (ref 3.6–5.1)
Alkaline phosphatase (APISO): 65 U/L (ref 37–153)
BUN: 14 mg/dL (ref 7–25)
CO2: 29 mmol/L (ref 20–32)
Calcium: 9.2 mg/dL (ref 8.6–10.4)
Chloride: 104 mmol/L (ref 98–110)
Creat: 0.82 mg/dL (ref 0.60–0.88)
GFR, Est African American: 78 mL/min/{1.73_m2} (ref >=60)
GFR, Est Non African American: 67 mL/min/{1.73_m2} (ref >=60)
Globulin: 2.6 g/dL (calc) (ref 1.9–3.7)
Glucose, Bld: 86 mg/dL (ref 65–99)
Potassium: 4.2 mmol/L (ref 3.5–5.3)
Sodium: 142 mmol/L (ref 135–146)
Total Bilirubin: 0.4 mg/dL (ref 0.2–1.2)
Total Protein: 6.9 g/dL (ref 6.1–8.1)

## 2021-01-05 NOTE — Progress Notes (Signed)
CBC and CMP WNL

## 2021-01-10 ENCOUNTER — Ambulatory Visit: Payer: Medicare Other | Admitting: Physician Assistant

## 2021-02-02 ENCOUNTER — Telehealth: Payer: Self-pay | Admitting: Rheumatology

## 2021-02-02 NOTE — Telephone Encounter (Signed)
Patient requesting a refill on Plaquenil  #1. for 8 day supply sent to [facility] on [address], and a  #2. full rx sent to [facility]. Patient took last does today. ( Patient requested refill from [facility] awhile back / but they need new rx, and she was not informed until she called today.)

## 2021-02-03 ENCOUNTER — Other Ambulatory Visit: Payer: Self-pay

## 2021-02-03 DIAGNOSIS — Z79899 Other long term (current) drug therapy: Secondary | ICD-10-CM

## 2021-02-03 DIAGNOSIS — M0609 Rheumatoid arthritis without rheumatoid factor, multiple sites: Secondary | ICD-10-CM

## 2021-02-03 MED ORDER — HYDROXYCHLOROQUINE SULFATE 200 MG PO TABS: 200.0000 mg | ORAL_TABLET | Freq: Every day | ORAL | 0 refills | Status: DC

## 2021-02-03 NOTE — Telephone Encounter (Signed)
RX for PLQ sent to [facility] 12/2020 for 90 day supply, patient will call [facility] to order PLQ.

## 2021-02-03 NOTE — Telephone Encounter (Signed)
Last Visit: 01/04/2021 Next Visit: 06/06/2021 Labs: 01/04/2021, CBC and CMP WNL Eye exam: 08/23/2020  Current Dose per office note 01/04/2021, Plaquenil 200 mg 1 tablet by mouth daily.  DX: Rheumatoid arthritis, seronegative, multiple sites  Last Fill: 01/04/2021  Okay to refill Plaquenil?  PATIENT NEEDS 1 WEEK UNTIL PLQ ARRIVES FROM [facility]IL ORDER [facility]ARM[facility].

## 2021-02-03 NOTE — Telephone Encounter (Signed)
Patient left a voicemail stating she called [facility] and they processed her Plaquenil refill and it was sent out yesterday.  They said when she ordered the medication it was not ready to be refilled so they put it on hold.  Patient states she didn't realize she had to call back to order it.   Patient states they cannot send anything overnight and was told she should receive it in 2-5 days.  Patient states their suggestion was for Dr. Estanislado Pandy to call in to her local pharmacy [facility] for some tablets until she receives her prescription.

## 2021-04-02 ENCOUNTER — Other Ambulatory Visit: Payer: Self-pay | Admitting: Physician Assistant

## 2021-04-02 DIAGNOSIS — Z79899 Other long term (current) drug therapy: Secondary | ICD-10-CM

## 2021-04-02 DIAGNOSIS — M0609 Rheumatoid arthritis without rheumatoid factor, multiple sites: Secondary | ICD-10-CM

## 2021-04-03 NOTE — Telephone Encounter (Signed)
Last Visit: 01/04/2021 Next Visit: 06/06/2021 Labs: 01/04/2021, CBC and CMP WNL Eye exam: 08/23/2020 f/u 6 months, I called patient to advise PLQ eye exam is due  Current Dose per office note 01/04/2021: Plaquenil 200 mg 1 tablet by mouth daily  RU:[id number] arthritis, seronegative, multiple sites  Last Fill: 02/03/2021  Okay to refill Plaquenil?

## 2021-05-23 NOTE — Progress Notes (Signed)
Office Visit Note  Patient: Megan Porter             Date of Birth: 07/24/1941           MRN: [id number]             PCP: Haywood Pao, MD Referring: Haywood Pao, MD Visit Date: 06/06/2021 Occupation: '@GUAROCC'$ @  Subjective:  Medication monitoring.   History of Present Illness: Megan Porter is a 80 y.o. female with a history of rheumatoid arthritis and osteoarthritis.  She denies any joint swelling.  She states she has been taking hydroxychloroquine on a regular basis.  She states she is achy in most of her joints.  She continues to have discomfort in her neck, lower back, hands and her knees.    Activities of Daily Living:  Patient reports morning stiffness for 1 hour.   Patient Denies nocturnal pain.  Difficulty dressing/grooming: Denies Difficulty climbing stairs: Reports Difficulty getting out of chair: Reports Difficulty using hands for taps, buttons, cutlery, and/or writing: Denies  Review of Systems  Constitutional:  Positive for fatigue.  HENT:  Negative for mouth sores, mouth dryness and nose dryness.   Eyes:  Negative for pain, itching and dryness.  Respiratory:  Negative for shortness of breath and difficulty breathing.   Cardiovascular:  Negative for chest pain and palpitations.  Gastrointestinal:  Positive for constipation. Negative for blood in stool and diarrhea.  Endocrine: Negative for increased urination.  Genitourinary:  Negative for difficulty urinating.  Musculoskeletal:  Positive for morning stiffness. Negative for joint pain, joint pain, joint swelling, myalgias, muscle tenderness and myalgias.  Skin:  Negative for color change, rash and redness.  Allergic/Immunologic: Negative for susceptible to infections.  Neurological:  Negative for dizziness, numbness, headaches, memory loss and weakness.  Hematological:  Positive for bruising/bleeding tendency.  Psychiatric/Behavioral:  Negative for confusion.    PMFS History:  Patient Active  Problem List   Diagnosis Date Noted   Abnormal CT scan of lung    Hypokalemia 09/21/2018   Benign essential HTN 09/21/2018   Leucocytosis 09/21/2018   Pneumonia 09/21/2018   Bronchiectasis without complication ([place]) [id number]   Primary osteoarthritis of both knees 04/06/2017   DDD (degenerative disc disease), lumbar history of lumbar surgery 1970 04/06/2017   Age-related osteoporosis without current pathological fracture 04/06/2017   History of anxiety 04/06/2017   History of breast cancer 04/04/2017   Primary osteoarthritis of both feet 10/02/2016   Primary osteoarthritis of both hands 10/02/2016   High risk medications (not anticoagulants) long-term use 10/02/2016   Rheumatoid arthritis, seronegative, multiple sites ([place]) 10/02/2016    Past Medical History:  Diagnosis Date   Arthritis    Cancer ([place])    Osteoarthritis     Family History  Family history unknown: Yes   Past Surgical History:  Procedure Laterality Date   BACK SURGERY     BREAST RECONSTRUCTION     knee arthroscopic     VIDEO BRONCHOSCOPY Bilateral 07/06/2019   Procedure: VIDEO BRONCHOSCOPY WITHOUT FLUORO;  Surgeon: Marshell Garfinkel, MD;  Location: [facility];  Service: Cardiopulmonary;  Laterality: Bilateral;   Social History   Social History Narrative   Not on file   Immunization History  Administered Date(s) Administered   Fluad Quad(high Dose 65+) 05/29/2019   Influenza-Unspecified 07/06/2017, 07/24/2018   PFIZER(Purple Top)SARS-COV-2 Vaccination 10/07/2019, 10/28/2019, 06/28/2020, 06/05/2021   Pneumococcal Conjugate-13 04/13/2014   Pneumococcal Polysaccharide-23 10/13/2018     Objective: Vital Signs: BP (!) 162/81 (BP Location:  Right Arm, Patient Position: Sitting, Cuff Size: Normal)   Pulse 75   Ht '5\' 3"'$  (1.6 m)   Wt 125 lb 6.4 oz (56.9 kg)   BMI 22.21 kg/m    Physical Exam Vitals and nursing note reviewed.  Constitutional:      Appearance: She is well-developed.  HENT:     Head:  Normocephalic and atraumatic.  Eyes:     Conjunctiva/sclera: Conjunctivae normal.  Cardiovascular:     Rate and Rhythm: Normal rate and regular rhythm.     Heart sounds: Normal heart sounds.  Pulmonary:     Effort: Pulmonary effort is normal.     Breath sounds: Normal breath sounds.  Abdominal:     General: Bowel sounds are normal.     Palpations: Abdomen is soft.  Musculoskeletal:     Cervical back: Normal range of motion.  Lymphadenopathy:     Cervical: No cervical adenopathy.  Skin:    General: Skin is warm and dry.     Capillary Refill: Capillary refill takes less than 2 seconds.  Neurological:     Mental Status: She is alert and oriented to person, place, and time.  Psychiatric:        Behavior: Behavior normal.     Musculoskeletal Exam: Limited range of motion for cervical and lumbar spine.  Shoulder joints, elbow joints, wrist joints with good range of motion.  She had no synovitis on examination.  She had bilateral PIP and DIP thickening and CMC thickening with subluxation.  Hip joints with limited range of motion without discomfort.  Knee joints with good range of motion without any discomfort.  No warmth swelling or effusion was noted.  There was no tenderness over ankles or MTPs.  CDAI Exam: CDAI Score: 0.3  Patient Global: 2 mm; Provider Global: 1 mm Swollen: 0 ; Tender: 0  Joint Exam 06/06/2021   No joint exam has been documented for this visit   There is currently no information documented on the homunculus. Go to the Rheumatology activity and complete the homunculus joint exam.  Investigation: No additional findings.  Imaging: No results found.  Recent Labs: Lab Results  Component Value Date   WBC 6.3 01/04/2021   HGB 13.7 01/04/2021   PLT 244 01/04/2021   NA 142 01/04/2021   K 4.2 01/04/2021   CL 104 01/04/2021   CO2 29 01/04/2021   GLUCOSE 86 01/04/2021   BUN 14 01/04/2021   CREATININE 0.82 01/04/2021   BILITOT 0.4 01/04/2021   ALKPHOS 70  09/25/2018   AST 25 01/04/2021   ALT 19 01/04/2021   PROT 6.9 01/04/2021   ALBUMIN 1.8 (L) 09/25/2018   CALCIUM 9.2 01/04/2021   GFRAA 78 01/04/2021    Speciality Comments: PLQ Eye Exam 04/26/2021 WNL  @ [facility] Follow up in 6 months.   Procedures:  No procedures performed Allergies: Patient has no known allergies.   Assessment / Plan:     Visit Diagnoses: Rheumatoid arthritis, seronegative, multiple sites ([place]) - Erosive disease neg RF,CCP, and ANA: Patient had no synovitis on examination today.  She is tolerating Plaquenil well.  High risk medications (not anticoagulants) long-term use - Plaquenil 200 mg 1 tablet by mouth daily. D/c MTX in April 2020 due to recurrent infections. PLQ Eye Exam 04/26/2021 - Plan: CBC with Differential/Platelet, COMPLETE METABOLIC PANEL WITH GFR today and then every 5 months.  Recommendations regarding mineralizations were placed in the AVS.  Primary osteoarthritis of both hands-she has severe osteoarthritis in her  hands which causes discomfort.  A handout on muscle strengthening exercises was given.  Joint protection muscle strengthening was discussed.  S/P left knee arthroscopy - History of meniscal repair December 2020.   Primary osteoarthritis of both hips-she has limited range of motion without discomfort.  Primary osteoarthritis of both knees-she is off-and-on discomfort in her knee joints.  Her balance is not very good.  I will refer her to physical therapy for lower extremity muscle strengthening and fall prevention.  Primary osteoarthritis of both feet-she has osteoarthritis in her feet but no synovitis was noted.  Poor balance-patient gives history of poor balance.  I will refer her to physical therapy.  Some of the strengthening exercises were demonstrated in the office.  DDD (degenerative disc disease), lumbar history of lumbar surgery 1970-she denies any lower back pain.  Age-related osteoporosis without current  pathological fracture - DEXA ordered by PCP.  Records not in epic.  She is currently taking fosamax 70 mg 1 tablet by mouth once weekly   Other closed fracture of distal end of right radius with routine healing, subsequent encounter - evaluated by Dr. Jeannie Fend in February 2022.  She sustained a closed fracture of the distal end of the radius and was placed in a cast for 8 weeks.  History of anxiety  History of breast cancer  Orders: Orders Placed This Encounter  Procedures   CBC with Differential/Platelet   COMPLETE METABOLIC PANEL WITH GFR    No orders of the defined types were placed in this encounter.    Follow-Up Instructions: Return in about 5 months (around 11/06/2021) for Rheumatoid arthritis, Osteoarthritis.   Bo Merino, MD  Note - This record has been created using Editor, commissioning.  Chart creation errors have been sought, but may not always  have been located. Such creation errors do not reflect on  the standard of medical care.

## 2021-06-06 ENCOUNTER — Other Ambulatory Visit: Payer: Self-pay

## 2021-06-06 ENCOUNTER — Ambulatory Visit: Payer: Medicare Other | Admitting: Rheumatology

## 2021-06-06 ENCOUNTER — Encounter: Payer: Self-pay | Admitting: Rheumatology

## 2021-06-06 VITALS — BP 162/81 | HR 75 | Ht 63.0 in | Wt 125.4 lb

## 2021-06-06 DIAGNOSIS — M0609 Rheumatoid arthritis without rheumatoid factor, multiple sites: Secondary | ICD-10-CM

## 2021-06-06 DIAGNOSIS — Z8659 Personal history of other mental and behavioral disorders: Secondary | ICD-10-CM

## 2021-06-06 DIAGNOSIS — Z853 Personal history of malignant neoplasm of breast: Secondary | ICD-10-CM

## 2021-06-06 DIAGNOSIS — M160 Bilateral primary osteoarthritis of hip: Secondary | ICD-10-CM

## 2021-06-06 DIAGNOSIS — R2689 Other abnormalities of gait and mobility: Secondary | ICD-10-CM

## 2021-06-06 DIAGNOSIS — M170 Bilateral primary osteoarthritis of knee: Secondary | ICD-10-CM

## 2021-06-06 DIAGNOSIS — Z9889 Other specified postprocedural states: Secondary | ICD-10-CM

## 2021-06-06 DIAGNOSIS — Z79899 Other long term (current) drug therapy: Secondary | ICD-10-CM

## 2021-06-06 DIAGNOSIS — M7061 Trochanteric bursitis, right hip: Secondary | ICD-10-CM

## 2021-06-06 DIAGNOSIS — M19072 Primary osteoarthritis, left ankle and foot: Secondary | ICD-10-CM

## 2021-06-06 DIAGNOSIS — M810 Age-related osteoporosis without current pathological fracture: Secondary | ICD-10-CM

## 2021-06-06 DIAGNOSIS — M5136 Other intervertebral disc degeneration, lumbar region: Secondary | ICD-10-CM

## 2021-06-06 DIAGNOSIS — S52591D Other fractures of lower end of right radius, subsequent encounter for closed fracture with routine healing: Secondary | ICD-10-CM

## 2021-06-06 DIAGNOSIS — M19041 Primary osteoarthritis, right hand: Secondary | ICD-10-CM

## 2021-06-06 DIAGNOSIS — M19042 Primary osteoarthritis, left hand: Secondary | ICD-10-CM

## 2021-06-06 DIAGNOSIS — M19071 Primary osteoarthritis, right ankle and foot: Secondary | ICD-10-CM

## 2021-06-06 LAB — CBC WITH DIFFERENTIAL/PLATELET
Absolute Monocytes: 390 cells/uL (ref 200–950)
Basophils Absolute: 31 cells/uL (ref 0–200)
Basophils Relative: 0.6 %
Eosinophils Absolute: 88 cells/uL (ref 15–500)
Eosinophils Relative: 1.7 %
HCT: 41.7 % (ref 35.0–45.0)
Hemoglobin: 13.6 g/dL (ref 11.7–15.5)
Lymphs Abs: 390 cells/uL — ABNORMAL LOW (ref 850–3900)
MCH: 32.2 pg (ref 27.0–33.0)
MCHC: 32.6 g/dL (ref 32.0–36.0)
MCV: 98.6 fL (ref 80.0–100.0)
MPV: 10.2 fL (ref 7.5–12.5)
Monocytes Relative: 7.5 %
Neutro Abs: 4300 cells/uL (ref 1500–7800)
Neutrophils Relative %: 82.7 %
Platelets: 175 10*3/uL (ref 140–400)
RBC: 4.23 10*6/uL (ref 3.80–5.10)
RDW: 12.2 % (ref 11.0–15.0)
Total Lymphocyte: 7.5 %
WBC: 5.2 10*3/uL (ref 3.8–10.8)

## 2021-06-06 LAB — COMPLETE METABOLIC PANEL WITH GFR
AG Ratio: 1.6 (calc) (ref 1.0–2.5)
ALT: 12 U/L (ref 6–29)
AST: 20 U/L (ref 10–35)
Albumin: 4.3 g/dL (ref 3.6–5.1)
Alkaline phosphatase (APISO): 70 U/L (ref 37–153)
BUN: 14 mg/dL (ref 7–25)
CO2: 31 mmol/L (ref 20–32)
Calcium: 9.4 mg/dL (ref 8.6–10.4)
Chloride: 102 mmol/L (ref 98–110)
Creat: 0.77 mg/dL (ref 0.60–0.95)
Globulin: 2.7 g/dL (calc) (ref 1.9–3.7)
Glucose, Bld: 79 mg/dL (ref 65–99)
Potassium: 4.8 mmol/L (ref 3.5–5.3)
Sodium: 140 mmol/L (ref 135–146)
Total Bilirubin: 0.5 mg/dL (ref 0.2–1.2)
Total Protein: 7 g/dL (ref 6.1–8.1)
eGFR: 77 mL/min/{1.73_m2} (ref >=60)

## 2021-06-06 NOTE — Patient Instructions (Addendum)
Vaccines You are taking a medication(s) that can suppress your immune system.  The following immunizations are recommended: Flu annually Covid-19  Td/Tdap (tetanus, diphtheria, pertussis) every 10 years Pneumonia (Prevnar 15 then Pneumovax 23 at least 1 year apart.  Alternatively, can take Prevnar 20 without needing additional dose) Shingrix: 2 doses from 4 weeks to 6 months apart  Please check with your PCP to make sure you are up to date.   Hand Exercises Hand exercises can be helpful for almost anyone. These exercises can strengthen the hands, improve flexibility and movement, and increase blood flow to the hands. These results can make work and daily tasks easier. Hand exercises can be especially helpful for people who have joint pain from arthritis or have nerve damage from overuse (carpal tunnel syndrome). These exercises can also help people who have injured a hand. Exercises Most of these hand exercises are gentle stretching and motion exercises. It is usually safe to do them often throughout the day. Warming up your hands before exercise may help to reduce stiffness. You can do this with gentle massage or by placing your hands in warm water for 10-15 minutes. It is normal to feel some stretching, pulling, tightness, or mild discomfort as you begin new exercises. This will gradually improve. Stop an exercise right away if you feel sudden, severe pain or your pain gets worse. Ask your health care provider which exercises are best for you. Knuckle bend or "claw" fist  Stand or sit with your arm, hand, and all five fingers pointed straight up. Make sure to keep your wrist straight during the exercise. Gently bend your fingers down toward your palm until the tips of your fingers are touching the top of your palm. Keep your big knuckle straight and just bend the small knuckles in your fingers. Hold this position for __________ seconds. Straighten (extend) your fingers back to the starting  position. Repeat this exercise 5-10 times with each hand. Full finger fist  Stand or sit with your arm, hand, and all five fingers pointed straight up. Make sure to keep your wrist straight during the exercise. Gently bend your fingers into your palm until the tips of your fingers are touching the middle of your palm. Hold this position for __________ seconds. Extend your fingers back to the starting position, stretching every joint fully. Repeat this exercise 5-10 times with each hand. Straight fist Stand or sit with your arm, hand, and all five fingers pointed straight up. Make sure to keep your wrist straight during the exercise. Gently bend your fingers at the big knuckle, where your fingers meet your hand, and the middle knuckle. Keep the knuckle at the tips of your fingers straight and try to touch the bottom of your palm. Hold this position for __________ seconds. Extend your fingers back to the starting position, stretching every joint fully. Repeat this exercise 5-10 times with each hand. Tabletop  Stand or sit with your arm, hand, and all five fingers pointed straight up. Make sure to keep your wrist straight during the exercise. Gently bend your fingers at the big knuckle, where your fingers meet your hand, as far down as you can while keeping the small knuckles in your fingers straight. Think of forming a tabletop with your fingers. Hold this position for __________ seconds. Extend your fingers back to the starting position, stretching every joint fully. Repeat this exercise 5-10 times with each hand. Finger spread  Place your hand flat on a table with your palm  facing down. Make sure your wrist stays straight as you do this exercise. Spread your fingers and thumb apart from each other as far as you can until you feel a gentle stretch. Hold this position for __________ seconds. Bring your fingers and thumb tight together again. Hold this position for __________ seconds. Repeat  this exercise 5-10 times with each hand. Making circles  Stand or sit with your arm, hand, and all five fingers pointed straight up. Make sure to keep your wrist straight during the exercise. Make a circle by touching the tip of your thumb to the tip of your index finger. Hold for __________ seconds. Then open your hand wide. Repeat this motion with your thumb and each finger on your hand. Repeat this exercise 5-10 times with each hand. Thumb motion  Sit with your forearm resting on a table and your wrist straight. Your thumb should be facing up toward the ceiling. Keep your fingers relaxed as you move your thumb. Lift your thumb up as high as you can toward the ceiling. Hold for __________ seconds. Bend your thumb across your palm as far as you can, reaching the tip of your thumb for the small finger (pinkie) side of your palm. Hold for __________ seconds. Repeat this exercise 5-10 times with each hand. Grip strengthening  Hold a stress ball or other soft ball in the middle of your hand. Slowly increase the pressure, squeezing the ball as much as you can without causing pain. Think of bringing the tips of your fingers into the middle of your palm. All of your finger joints should bend when doing this exercise. Hold your squeeze for __________ seconds, then relax. Repeat this exercise 5-10 times with each hand. Contact a health care provider if: Your hand pain or discomfort gets much worse when you do an exercise. Your hand pain or discomfort does not improve within 2 hours after you exercise. If you have any of these problems, stop doing these exercises right away. Do not do them again unless your health care provider says that you can. Get help right away if: You develop sudden, severe hand pain or swelling. If this happens, stop doing these exercises right away. Do not do them again unless your health care provider says that you can. This information is not intended to replace advice  given to you by your health care provider. Make sure you discuss any questions you have with your health care provider. Document Revised: 12/22/2020 Document Reviewed: 12/22/2020 Elsevier Patient Education  [place].

## 2021-06-07 NOTE — Progress Notes (Signed)
CBC and CMP are normal except lymphocyte count is low, due to immunosuppressive therapy.  We will continue to monitor labs.

## 2021-06-23 ENCOUNTER — Other Ambulatory Visit: Payer: Self-pay | Admitting: Physician Assistant

## 2021-06-23 DIAGNOSIS — Z79899 Other long term (current) drug therapy: Secondary | ICD-10-CM

## 2021-06-23 DIAGNOSIS — M0609 Rheumatoid arthritis without rheumatoid factor, multiple sites: Secondary | ICD-10-CM

## 2021-06-23 NOTE — Telephone Encounter (Signed)
Next Visit: 12/05/2021  Last Visit: 06/06/2021  Labs: 06/06/2021 CBC and CMP are normal except lymphocyte count is low, due to immunosuppressive therapy.   Eye exam: 04/26/2021 WNL    Current Dose per office note 06/06/2021: Plaquenil 200 mg 1 tablet by mouth daily  GO:VPCHEKBTCY arthritis, seronegative, multiple sites   Last Fill: 04/03/2021  Okay to refill Plaquenil?

## 2021-07-31 ENCOUNTER — Telehealth: Payer: Self-pay | Admitting: Rheumatology

## 2021-07-31 NOTE — Telephone Encounter (Signed)
Patient is ready to schedule PT, referral has be re-opened per patient's request. Patient has the phone number and will call to schedule an appointment.

## 2021-07-31 NOTE — Telephone Encounter (Signed)
Patient calling to let you know she is ready to schedule PT now. Could you please open the referral? Please call patient to advise.

## 2021-08-01 ENCOUNTER — Telehealth: Payer: Self-pay

## 2021-08-01 NOTE — Telephone Encounter (Signed)
Opened in error

## 2021-08-14 ENCOUNTER — Other Ambulatory Visit (HOSPITAL_COMMUNITY): Payer: Self-pay | Admitting: *Deleted

## 2021-08-15 ENCOUNTER — Inpatient Hospital Stay (HOSPITAL_COMMUNITY): Admission: RE | Admit: 2021-08-15 | Payer: Medicare Other | Source: Ambulatory Visit

## 2021-08-22 ENCOUNTER — Ambulatory Visit: Payer: Medicare Other

## 2021-08-29 ENCOUNTER — Other Ambulatory Visit: Payer: Self-pay

## 2021-08-29 ENCOUNTER — Ambulatory Visit: Payer: Medicare Other | Attending: Rheumatology

## 2021-08-29 DIAGNOSIS — R2681 Unsteadiness on feet: Secondary | ICD-10-CM | POA: Diagnosis present

## 2021-08-29 DIAGNOSIS — R262 Difficulty in walking, not elsewhere classified: Secondary | ICD-10-CM | POA: Insufficient documentation

## 2021-08-29 DIAGNOSIS — G8929 Other chronic pain: Secondary | ICD-10-CM | POA: Insufficient documentation

## 2021-08-29 DIAGNOSIS — M6281 Muscle weakness (generalized): Secondary | ICD-10-CM | POA: Insufficient documentation

## 2021-08-29 DIAGNOSIS — M25562 Pain in left knee: Secondary | ICD-10-CM | POA: Diagnosis present

## 2021-08-29 NOTE — Therapy (Signed)
OUTPATIENT PHYSICAL THERAPY LOWER EXTREMITY EVALUATION   Patient Name: Megan Porter MRN: [id number] DOB:1940/09/28, 80 y.o., female Today's Date: 08/29/2021   PT End of Session - 08/29/21 1804     Visit Number 1    Number of Visits 17    Date for PT Re-Evaluation 10/24/21    Authorization Type [employer] Medicare    PT Start Time 1355    PT Stop Time 1440    PT Time Calculation (min) 45 min    Activity Tolerance Patient tolerated treatment well    Behavior During Therapy WFL for tasks assessed/performed             Past Medical History:  Diagnosis Date   Arthritis    Cancer ([place])    Osteoarthritis    Past Surgical History:  Procedure Laterality Date   BACK SURGERY     BREAST RECONSTRUCTION     knee arthroscopic     VIDEO BRONCHOSCOPY Bilateral 07/06/2019   Procedure: VIDEO BRONCHOSCOPY WITHOUT FLUORO;  Surgeon: Marshell Garfinkel, MD;  Location: [place];  Service: Cardiopulmonary;  Laterality: Bilateral;   Patient Active Problem List   Diagnosis Date Noted   Abnormal CT scan of lung    Hypokalemia 09/21/2018   Benign essential HTN 09/21/2018   Leucocytosis 09/21/2018   Pneumonia 09/21/2018   Bronchiectasis without complication ([place]) 95/18/8416   Primary osteoarthritis of both knees 04/06/2017   DDD (degenerative disc disease), lumbar history of lumbar surgery 1970 04/06/2017   Age-related osteoporosis without current pathological fracture 04/06/2017   History of anxiety 04/06/2017   History of breast cancer 04/04/2017   Primary osteoarthritis of both feet 10/02/2016   Primary osteoarthritis of both hands 10/02/2016   High risk medications (not anticoagulants) long-term use 10/02/2016   Rheumatoid arthritis, seronegative, multiple sites ([place]) 10/02/2016    PCP: Tisovec, Fransico Him, MD  REFERRING PROVIDER: Bo Merino, MD  REFERRING DIAG: M17.0 (ICD-10-CM) - Primary osteoarthritis of both knees R26.89 (ICD-10-CM) - Poor balance  THERAPY DIAG:  Muscle  weakness (generalized)  Chronic pain of left knee  Difficulty in walking, not elsewhere classified  Unsteadiness on feet  ONSET DATE: Chronic  SUBJECTIVE:   SUBJECTIVE STATEMENT: Pt presents to PT with reports of increasing instability and imbalance. About two years ago she had surgery on her L medial meniscus, notes that she did not have therapy and has had continued pain. Pt does report that she does have good success with shots and conservative management. She has had two falls when stepping off of step ladder to get something off her shelves. Has noticed gradually decrease in LE strength and believes this also contributes to falls and unsteadiness.   PERTINENT HISTORY: Previous L knee meniscal repair; increasing imbalance and instability and chronic L knee pain  PAIN:  Are you having pain? No VAS scale: 0/10 (4/10 worst) Pain location: R knee  PAIN TYPE: aching Pain description: intermittent  Aggravating factors: stairs, walking Relieving factors: injections  PRECAUTIONS: None  WEIGHT BEARING RESTRICTIONS No  FALLS:  Has patient fallen in last 6 months? Yes, Number of falls: 2 - when stepping off ladder  LIVING ENVIRONMENT: Lives with: lives with their spouse Lives in: House/apartment Stairs: Yes; External: 5 steps; Rail on B going up Has following equipment at home: None  OCCUPATION: Retired  PLOF: Independent and Independent with basic ADLs  PATIENT GOALS: Decrease L knee pain and improve balance   OBJECTIVE:   DIAGNOSTIC FINDINGS: N/A  PATIENT SURVEYS:  FOTO 61% function; 61%  predicted  COGNITION:  Overall cognitive status: Within functional limits for tasks assessed     SENSATION:  Light touch: Appears intact  POSTURE:  Increased thoracic kyphosis, forward head  LE MMT:  MMT Right 08/29/2021 Left 08/29/2021  Hip flexion 2+/5 3/5  Hip extension    Hip abduction 3/5 3/5  Knee flexion 4/5 4/5  Knee extension 4/5 4/5   (Blank rows = not  tested)  LOWER EXTREMITY SPECIAL TESTS:  N/A  FUNCTIONAL TESTS:  Timed up and go (TUG): 12 - w/ LoB when turning 30" STS:  10 reps - inc L knee pain SLS: R - 5 sec; L - 3 sec  GAIT: Distance walked: 53ft Assistive device utilized: None Level of assistance: Complete Independence Comments: trunk flexed, decreased step length, decreased gait speed  TODAY'S TREATMENT: Therapeutic Exercise:  Supine SLR x 5 ea Bridge x 10 Supine clamshell x 10 GTB Row x 10 RTB Tandem stance x 30" ea  PATIENT EDUCATION:  Education details: eval findings, FOTO, HEP, POC Person educated: Patient Education method: Explanation, Demonstration, and Handouts Education comprehension: verbalized understanding, returned demonstration, and verbal cues required   HOME EXERCISE PROGRAM: Access Code: [email or web address]  ASSESSMENT:  CLINICAL IMPRESSION: Patient is a 80 y/o F who presents to PT with reports of L knee pain and decreased balance. Physical findings are consistent with physician impression, as pt demonstrates significant decrease in proximal hip strength and decreased balance in narrow BoS. She would benefit from skilled PT services working on improving LE strength as well as static and dynamic balance in order to improve functional mobility and decrease fall risk. Will assess response to HEP and progress as able.   REHAB POTENTIAL: Good  CLINICAL DECISION MAKING: Stable/uncomplicated  EVALUATION COMPLEXITY: Low   GOALS:  SHORT TERM GOALS:  STG Name Target Date Goal status  1 Pt will be compliant and knowledgeable with 90% of HEP for improved comfort and carryover Baseline: initial HEP given 09/19/2021 INITIAL  2 Pt will be able to hold tandem stance for 30" with no UE support in order to improve static balance with narrow BoS Baseline: unable 09/19/2021 INITIAL   LONG TERM GOALS:   LTG Name Target Date Goal status  1 Pt will improve LE MMT to no less than 4/5 for all tested motions in order to  improve functional mobility and balance Baseline: see objective 10/24/2021 INITIAL  2 Pt will improve reps in 30"STS to no less than 12 in order to improve balance, LE strength, and decrease fall risk with no inc in L knee pain Baseline: 10 reps with increased L knee pain 10/24/2021 INITIAL  3 Pt will improve SLS time to no less than 30 seconds for improved balance and stability Baseline: R - 5 sec; L - 3 sec 10/24/2021 INITIAL   PLAN: PT FREQUENCY: 2x/week  PT DURATION: 8 weeks  PLANNED INTERVENTIONS: Therapeutic exercises, Therapeutic activity, Neuro Muscular re-education, Balance training, Gait training, Patient/Family education, Joint mobilization, and Manual therapy  PLAN FOR NEXT SESSION: assess response to HEP; progress LE strength and balance as able   Ward Chatters 08/29/2021, 6:07 PM

## 2021-09-12 ENCOUNTER — Other Ambulatory Visit: Payer: Self-pay | Admitting: Physician Assistant

## 2021-09-12 DIAGNOSIS — Z79899 Other long term (current) drug therapy: Secondary | ICD-10-CM

## 2021-09-12 DIAGNOSIS — M0609 Rheumatoid arthritis without rheumatoid factor, multiple sites: Secondary | ICD-10-CM

## 2021-09-12 NOTE — Therapy (Deleted)
OUTPATIENT PHYSICAL THERAPY TREATMENT NOTE   Patient Name: Megan Porter MRN: [id number] DOB:22-Aug-1941, 80 y.o., female Today's Date: 09/12/2021  PCP: Haywood Pao, MD REFERRING PROVIDER: Bo Merino, MD    Past Medical History:  Diagnosis Date   Arthritis    Cancer [facility])    Osteoarthritis    Past Surgical History:  Procedure Laterality Date   BACK SURGERY     BREAST RECONSTRUCTION     knee arthroscopic     VIDEO BRONCHOSCOPY Bilateral 07/06/2019   Procedure: VIDEO BRONCHOSCOPY WITHOUT FLUORO;  Surgeon: Marshell Garfinkel, MD;  Location: [facility];  Service: Cardiopulmonary;  Laterality: Bilateral;   Patient Active Problem List   Diagnosis Date Noted   Abnormal CT scan of lung    Hypokalemia 09/21/2018   Benign essential HTN 09/21/2018   Leucocytosis 09/21/2018   Pneumonia 09/21/2018   Bronchiectasis without complication ([place]) 18/29/9371   Primary osteoarthritis of both knees 04/06/2017   DDD (degenerative disc disease), lumbar history of lumbar surgery 1970 04/06/2017   Age-related osteoporosis without current pathological fracture 04/06/2017   History of anxiety 04/06/2017   History of breast cancer 04/04/2017   Primary osteoarthritis of both feet 10/02/2016   Primary osteoarthritis of both hands 10/02/2016   High risk medications (not anticoagulants) long-term use 10/02/2016   Rheumatoid arthritis, seronegative, multiple sites ([place]) 10/02/2016    REFERRING DIAG: M17.0 (ICD-10-CM) - Primary osteoarthritis of both knees R26.89 (ICD-10-CM) - Poor balance   THERAPY DIAG:  Muscle weakness (generalized) Chronic pain of left knee Difficulty in walking, not elsewhere classified Unsteadiness on feet   ONSET DATE: Chronic  PERTINENT HISTORY: Previous L knee meniscal repair; increasing imbalance and instability and chronic L knee pain  PRECAUTIONS: None  SUBJECTIVE: ***  PAIN:  Are you having pain? {yes/no:20286} VAS scale: ***/10 Pain location:  *** Pain orientation: {Pain Orientation:25161}  PAIN TYPE: {type:313116} Pain description: {PAIN DESCRIPTION:21022940}  Aggravating factors: *** Relieving factors: ***    OBJECTIVE:    DIAGNOSTIC FINDINGS: N/A   PATIENT SURVEYS:  FOTO 61% function; 61% predicted   COGNITION:          Overall cognitive status: Within functional limits for tasks assessed                        SENSATION:          Light touch: Appears intact   POSTURE:  Increased thoracic kyphosis, forward head   LE MMT:   MMT Right 08/29/2021 Left 08/29/2021  Hip flexion 2+/5 3/5  Hip extension      Hip abduction 3/5 3/5  Knee flexion 4/5 4/5  Knee extension 4/5 4/5   (Blank rows = not tested)   LOWER EXTREMITY SPECIAL TESTS:  N/A   FUNCTIONAL TESTS:  Timed up and go (TUG): 12 - w/ LoB when turning 30" STS:  10 reps - inc L knee pain SLS: R - 5 sec; L - 3 sec   GAIT: Distance walked: 50ft Assistive device utilized: None Level of assistance: Complete Independence Comments: trunk flexed, decreased step length, decreased gait speed   TODAY'S TREATMENT: Therapeutic Exercise:  Supine SLR x 5 ea Bridge x 10 Supine clamshell x 10 GTB Row x 10 RTB Tandem stance x 30" ea   PATIENT EDUCATION:  Education details: eval findings, FOTO, HEP, POC Person educated: Patient Education method: Explanation, Demonstration, and Handouts Education comprehension: verbalized understanding, returned demonstration, and verbal cues required     HOME EXERCISE PROGRAM: Access  Code: [email or web address]   ASSESSMENT:   CLINICAL IMPRESSION: ***   REHAB POTENTIAL: Good   CLINICAL DECISION MAKING: Stable/uncomplicated   EVALUATION COMPLEXITY: Low     GOALS:   SHORT TERM GOALS:   STG Name Target Date Goal status  1 Pt will be compliant and knowledgeable with 90% of HEP for improved comfort and carryover Baseline: initial HEP given 09/19/2021 INITIAL  2 Pt will be able to hold tandem stance for 30" with no UE  support in order to improve static balance with narrow BoS Baseline: unable 09/19/2021 INITIAL    LONG TERM GOALS:    LTG Name Target Date Goal status  1 Pt will improve LE MMT to no less than 4/5 for all tested motions in order to improve functional mobility and balance Baseline: see objective 10/24/2021 INITIAL  2 Pt will improve reps in 30"STS to no less than 12 in order to improve balance, LE strength, and decrease fall risk with no inc in L knee pain Baseline: 10 reps with increased L knee pain 10/24/2021 INITIAL  3 Pt will improve SLS time to no less than 30 seconds for improved balance and stability Baseline: R - 5 sec; L - 3 sec 10/24/2021 INITIAL    PLAN: PT FREQUENCY: 2x/week   PT DURATION: 8 weeks   PLANNED INTERVENTIONS: Therapeutic exercises, Therapeutic activity, Neuro Muscular re-education, Balance training, Gait training, Patient/Family education, Joint mobilization, and Manual therapy   PLAN FOR NEXT SESSION: assess response to HEP; progress LE strength and balance as able   Ward Chatters 09/12/2021, 10:22 AM

## 2021-09-13 ENCOUNTER — Ambulatory Visit: Payer: Medicare Other

## 2021-09-13 NOTE — Telephone Encounter (Signed)
Next Visit: 12/05/2021  Last Visit: 06/06/2021  Labs: 06/06/2021 CBC and CMP are normal except lymphocyte count is low, due to immunosuppressive therapy.  Eye exam:  04/26/2021 WNL   Current Dose per office note 06/06/2021: Plaquenil 200 mg 1 tablet by mouth daily  CJ:[id number] arthritis, seronegative, multiple sites   Last Fill: 06/23/2021  Okay to refill Plaquenil?

## 2021-09-19 ENCOUNTER — Ambulatory Visit: Payer: Medicare Other | Attending: Rheumatology | Admitting: Physical Therapy

## 2021-09-19 ENCOUNTER — Encounter: Payer: Self-pay | Admitting: Physical Therapy

## 2021-09-19 ENCOUNTER — Other Ambulatory Visit: Payer: Self-pay

## 2021-09-19 DIAGNOSIS — R2681 Unsteadiness on feet: Secondary | ICD-10-CM

## 2021-09-19 DIAGNOSIS — G8929 Other chronic pain: Secondary | ICD-10-CM

## 2021-09-19 DIAGNOSIS — R262 Difficulty in walking, not elsewhere classified: Secondary | ICD-10-CM | POA: Diagnosis present

## 2021-09-19 DIAGNOSIS — M6281 Muscle weakness (generalized): Secondary | ICD-10-CM

## 2021-09-19 DIAGNOSIS — M25562 Pain in left knee: Secondary | ICD-10-CM | POA: Diagnosis not present

## 2021-09-19 NOTE — Therapy (Signed)
OUTPATIENT PHYSICAL THERAPY TREATMENT NOTE   Patient Name: Megan Porter MRN: [id number] DOB:1941/08/07, 81 y.o., female Today's Date: 09/19/2021  PCP: Haywood Pao, MD REFERRING PROVIDER: Bo Merino, MD   PT End of Session - 09/19/21 1153     Visit Number 2    Number of Visits 17    Date for PT Re-Evaluation 10/24/21    Authorization Type [employer] Medicare    PT Start Time 1152    PT Stop Time 1230    PT Time Calculation (min) 38 min             Past Medical History:  Diagnosis Date   Arthritis    Cancer ([place])    Osteoarthritis    Past Surgical History:  Procedure Laterality Date   BACK SURGERY     BREAST RECONSTRUCTION     knee arthroscopic     VIDEO BRONCHOSCOPY Bilateral 07/06/2019   Procedure: VIDEO BRONCHOSCOPY WITHOUT FLUORO;  Surgeon: Marshell Garfinkel, MD;  Location: [place];  Service: Cardiopulmonary;  Laterality: Bilateral;   Patient Active Problem List   Diagnosis Date Noted   Abnormal CT scan of lung    Hypokalemia 09/21/2018   Benign essential HTN 09/21/2018   Leucocytosis 09/21/2018   Pneumonia 09/21/2018   Bronchiectasis without complication ([place]) 54/27/0623   Primary osteoarthritis of both knees 04/06/2017   DDD (degenerative disc disease), lumbar history of lumbar surgery 1970 04/06/2017   Age-related osteoporosis without current pathological fracture 04/06/2017   History of anxiety 04/06/2017   History of breast cancer 04/04/2017   Primary osteoarthritis of both feet 10/02/2016   Primary osteoarthritis of both hands 10/02/2016   High risk medications (not anticoagulants) long-term use 10/02/2016   Rheumatoid arthritis, seronegative, multiple sites ([place]) 10/02/2016    REFERRING DIAG: REFERRING DIAG: M17.0 (ICD-10-CM) - Primary osteoarthritis of both knees R26.89 (ICD-10-CM) - Poor balance     ONSET DATE: Chronic  THERAPY DIAG:  Chronic pain of left knee  Muscle weakness (generalized)  Difficulty in walking, not  elsewhere classified  Unsteadiness on feet  PERTINENT HISTORY: Previous L knee meniscal repair; increasing imbalance and instability and chronic L knee pain   PRECAUTIONS: None   WEIGHT BEARING RESTRICTIONS No  SUBJECTIVE: No pain now unless I do something. I have been working on putting [employer] back up, but I fell while stepping back in the closet. I skinned my left knee.   PAIN:  Are you having pain? No VAS scale: 0/10 Pain location: N/A Pain orientation: N/A  PAIN TYPE: N/A Pain description: N/A  Aggravating factors: stairs, activity  Relieving factors: avoid activity     OBJECTIVE:    DIAGNOSTIC FINDINGS: N/A   PATIENT SURVEYS:  FOTO 61% function; 61% predicted   COGNITION:          Overall cognitive status: Within functional limits for tasks assessed                        SENSATION:          Light touch: Appears intact   POSTURE:  Increased thoracic kyphosis, forward head   LE MMT:   MMT Right 08/29/2021 Left 08/29/2021  Hip flexion 2+/5 3/5  Hip extension      Hip abduction 3/5 3/5  Knee flexion 4/5 4/5  Knee extension 4/5 4/5   (Blank rows = not tested)   LOWER EXTREMITY SPECIAL TESTS:  N/A   FUNCTIONAL TESTS: 08/29/21 Timed up and go (TUG): 12 -  w/ LoB when turning 30" STS:  10 reps - inc L knee pain SLS: R - 5 sec; L - 3 sec   GAIT: 08/29/21 Distance walked: 33ft Assistive device utilized: None Level of assistance: Complete Independence Comments: trunk flexed, decreased step length, decreased gait speed   09/19/21 TREATMENT: Therapeutic Exercise:  Supine SLR 10 x 3  Bridge  3 x 10 Supine clamshell  2 x 10 GTB cues to activate core for stabilization  Row x 10 x 2 RTB Tandem stance x 30" ea SLS - 15 sec right, 10 sec left  Side stepping and retro stepping at counter - 2 passes  Heel raises 10 x 2 with cues for slow eccentrics Blue rocker board - A/P rocking x 1 minute with UE.       08/29/21  TREATMENT: Therapeutic Exercise:  Supine SLR x 5 ea Bridge x 10 Supine clamshell x 10 GTB Row  x 10 RTB-  Tandem stance x 30" ea   PATIENT EDUCATION:  Education details: continue HEP Person educated: Patient Education method: Consulting civil engineer, Demonstration, and  reviewed Handouts Education comprehension: verbalized understanding, returned demonstration, and verbal cues required     HOME EXERCISE PROGRAM: Access Code: [email or web address]   ASSESSMENT:   CLINICAL IMPRESSION: Patient reports minimal compliance with HEP. She had another fall in closet while putting up Christmas decorations. She scraped her knee again but reports no other injury. Time spent with review of HEP. Progressed balance and ankle strengthening. She tolerated session without c/o increased pain.  Will continue and progress as able.    REHAB POTENTIAL: Good   CLINICAL DECISION MAKING: Stable/uncomplicated   EVALUATION COMPLEXITY: Low     GOALS:   SHORT TERM GOALS:   STG Name Target Date Goal status  1 Pt will be compliant and knowledgeable with 90% of HEP for improved comfort and carryover Baseline: initial HEP given 09/19/2021 INITIAL  2 Pt will be able to hold tandem stance for 30" with no UE support in order to improve static balance with narrow BoS Baseline: unable 09/19/2021 INITIAL    LONG TERM GOALS:    LTG Name Target Date Goal status  1 Pt will improve LE MMT to no less than 4/5 for all tested motions in order to improve functional mobility and balance Baseline: see objective 10/24/2021 INITIAL  2 Pt will improve reps in 30"STS to no less than 12 in order to improve balance, LE strength, and decrease fall risk with no inc in L knee pain Baseline: 10 reps with increased L knee pain 10/24/2021 INITIAL  3 Pt will improve SLS time to no less than 30 seconds for improved balance and stability Baseline: R - 5 sec; L - 3 sec 10/24/2021 INITIAL    PLAN: PT FREQUENCY: 2x/week   PT DURATION: 8 weeks   PLANNED  INTERVENTIONS: Therapeutic exercises, Therapeutic activity, Neuro Muscular re-education, Balance training, Gait training, Patient/Family education, Joint mobilization, and Manual therapy   PLAN FOR NEXT SESSION: assess response to HEP; progress LE strength and balance as able    Dorene Ar, PTA 09/19/2021, 12:39 PM

## 2021-09-21 ENCOUNTER — Encounter: Payer: Self-pay | Admitting: Physical Therapy

## 2021-09-21 ENCOUNTER — Other Ambulatory Visit: Payer: Self-pay

## 2021-09-21 ENCOUNTER — Ambulatory Visit: Payer: Medicare Other | Admitting: Physical Therapy

## 2021-09-21 DIAGNOSIS — M6281 Muscle weakness (generalized): Secondary | ICD-10-CM

## 2021-09-21 DIAGNOSIS — R262 Difficulty in walking, not elsewhere classified: Secondary | ICD-10-CM

## 2021-09-21 DIAGNOSIS — G8929 Other chronic pain: Secondary | ICD-10-CM

## 2021-09-21 DIAGNOSIS — R2681 Unsteadiness on feet: Secondary | ICD-10-CM

## 2021-09-21 DIAGNOSIS — M25562 Pain in left knee: Secondary | ICD-10-CM | POA: Diagnosis not present

## 2021-09-21 NOTE — Therapy (Signed)
OUTPATIENT PHYSICAL THERAPY TREATMENT NOTE   Patient Name: Megan Porter MRN: [id number] DOB:Sep 19, 1940, 81 y.o., female Today's Date: 09/21/2021  PCP: Haywood Pao, MD REFERRING PROVIDER: Bo Merino, MD   PT End of Session - 09/21/21 1109     Visit Number 3    Number of Visits 17    Date for PT Re-Evaluation 10/24/21    Authorization Type UHC Medicare    PT Start Time 1105             Past Medical History:  Diagnosis Date   Arthritis    Cancer ([place])    Osteoarthritis    Past Surgical History:  Procedure Laterality Date   BACK SURGERY     BREAST RECONSTRUCTION     knee arthroscopic     VIDEO BRONCHOSCOPY Bilateral 07/06/2019   Procedure: VIDEO BRONCHOSCOPY WITHOUT FLUORO;  Surgeon: Marshell Garfinkel, MD;  Location: [place];  Service: Cardiopulmonary;  Laterality: Bilateral;   Patient Active Problem List   Diagnosis Date Noted   Abnormal CT scan of lung    Hypokalemia 09/21/2018   Benign essential HTN 09/21/2018   Leucocytosis 09/21/2018   Pneumonia 09/21/2018   Bronchiectasis without complication ([place]) 26/20/3559   Primary osteoarthritis of both knees 04/06/2017   DDD (degenerative disc disease), lumbar history of lumbar surgery 1970 04/06/2017   Age-related osteoporosis without current pathological fracture 04/06/2017   History of anxiety 04/06/2017   History of breast cancer 04/04/2017   Primary osteoarthritis of both feet 10/02/2016   Primary osteoarthritis of both hands 10/02/2016   High risk medications (not anticoagulants) long-term use 10/02/2016   Rheumatoid arthritis, seronegative, multiple sites ([place]) 10/02/2016    REFERRING DIAG: REFERRING DIAG: M17.0 (ICD-10-CM) - Primary osteoarthritis of both knees R26.89 (ICD-10-CM) - Poor balance     ONSET DATE: Chronic  THERAPY DIAG:  Muscle weakness (generalized)  Chronic pain of left knee  Difficulty in walking, not elsewhere classified  Unsteadiness on feet  PERTINENT HISTORY:  Previous L knee meniscal repair; increasing imbalance and instability and chronic L knee pain   PRECAUTIONS: None   WEIGHT BEARING RESTRICTIONS No  SUBJECTIVE: "I felt good after last session. I was a little sore but it was good sore. No pain now. No new falls. "   PAIN:  Are you having pain? No VAS scale: 0/10 Pain location: N/A Pain orientation: N/A  PAIN TYPE: N/A Pain description: N/A  Aggravating factors: stairs, activity  Relieving factors: avoid activity     OBJECTIVE: taken 08/29/21 unless otherwise noted   DIAGNOSTIC FINDINGS: N/A   PATIENT SURVEYS:  FOTO 61% function; 61% predicted   COGNITION:          Overall cognitive status: Within functional limits for tasks assessed                        SENSATION:          Light touch: Appears intact   POSTURE:  Increased thoracic kyphosis, forward head   LE MMT:   MMT Right 08/29/2021 Left 08/29/2021  Hip flexion 2+/5 3/5  Hip extension      Hip abduction 3/5 3/5  Knee flexion 4/5 4/5  Knee extension 4/5 4/5   (Blank rows = not tested)   LOWER EXTREMITY SPECIAL TESTS:  N/A   FUNCTIONAL TESTS: 08/29/21 Timed up and go (TUG): 12 - w/ LoB when turning 30" STS:  10 reps - inc L knee pain SLS: R - 5 sec;  L - 3 sec   GAIT: 08/29/21 Distance walked: 77f Assistive device utilized: None Level of assistance: Complete Independence Comments: trunk flexed, decreased step length, decreased gait speed  09/21/21 Treatment Therapeutic Exercise:  Nustep L 5 UE/LE x 5 minutes  Heel raises 10 x 2 with cues for slow eccentrics Blue rocker board - A/P rocking x 1 minute without UE Tandem stance x 30" ea on airex, EO Narrow stance on airex EC, EO with head turns and nods  SLS - 30 sec right, 15 sec left  Side stepping (Red band at knees) 2 passes  retro stepping at counter - 2 passes  Supine SLR 10 x 3 Bridge  3 x 10 Supine clamshell  3 x 10 GTB cues to activate core for stabilization  Row x 10 x 3  RTB  09/19/21 TREATMENT: Therapeutic Exercise:  Supine SLR 10 x 3  Bridge  3 x 10 Supine clamshell  2 x 10 GTB cues to activate core for stabilization  Row x 10 x 2 RTB Tandem stance x 30" ea SLS - 15 sec right, 10 sec left  Side stepping and retro stepping at counter - 2 passes  Heel raises 10 x 2 with cues for slow eccentrics Blue rocker board - A/P rocking x 1 minute with UE.       08/29/21 TREATMENT: Therapeutic Exercise:  Supine SLR x 5 ea Bridge x 10 Supine clamshell x 10 GTB Row  x 10 RTB-  Tandem stance x 30" ea   PATIENT EDUCATION:  Education details: continue HEP Person educated: Patient Education method: EConsulting civil engineer Demonstration, and  reviewed Handouts Education comprehension: verbalized understanding, returned demonstration, and verbal cues required     HOME EXERCISE PROGRAM: Access Code: [email or web address]   ASSESSMENT:   CLINICAL IMPRESSION: Patient reports improved compliance with HEP and min soreness after last session. Progressed balance exercises today with good tolerance and noted improvement in tandem and SLS time bilateral She has met all STGs. . She tolerated session without c/o increased pain.  Will continue and progress as able.    REHAB POTENTIAL: Good   CLINICAL DECISION MAKING: Stable/uncomplicated   EVALUATION COMPLEXITY: Low     GOALS:   SHORT TERM GOALS: Updated 10/11/2021   STG Name Target Date Goal status  1 Pt will be compliant and knowledgeable with 90% of HEP for improved comfort and carryover Baseline: initial HEP given 09/19/2021 MET 09/21/21  2 Pt will be able to hold tandem stance for 30" with no UE support in order to improve static balance with narrow BoS Baseline: unable 09/19/2021 MET 09/21/21    LONG TERM GOALS:    LTG Name Target Date Goal status  1 Pt will improve LE MMT to no less than 4/5 for all tested motions in order to improve functional mobility and balance Baseline: see objective 10/24/2021 INITIAL  2 Pt will improve  reps in 30"STS to no less than 12 in order to improve balance, LE strength, and decrease fall risk with no inc in L knee pain Baseline: 10 reps with increased L knee pain 10/24/2021 INITIAL  3 Pt will improve SLS time to no less than 30 seconds for improved balance and stability Baseline: R - 5 sec; L - 3 sec 10/24/2021 INITIAL    PLAN: PT FREQUENCY: 2x/week   PT DURATION: 8 weeks   PLANNED INTERVENTIONS: Therapeutic exercises, Therapeutic activity, Neuro Muscular re-education, Balance training, Gait training, Patient/Family education, Joint mobilization, and Manual therapy   PLAN FOR NEXT  SESSION: assess response to HEP; progress LE strength and balance as able  Hessie Diener, PTA 09/21/21 11:10 AM Phone: [phone] Fax: [phone]

## 2021-09-26 ENCOUNTER — Ambulatory Visit: Payer: Medicare Other

## 2021-09-26 ENCOUNTER — Other Ambulatory Visit: Payer: Self-pay

## 2021-09-26 DIAGNOSIS — M6281 Muscle weakness (generalized): Secondary | ICD-10-CM

## 2021-09-26 DIAGNOSIS — R2681 Unsteadiness on feet: Secondary | ICD-10-CM

## 2021-09-26 DIAGNOSIS — G8929 Other chronic pain: Secondary | ICD-10-CM

## 2021-09-26 DIAGNOSIS — M25562 Pain in left knee: Secondary | ICD-10-CM | POA: Diagnosis not present

## 2021-09-26 DIAGNOSIS — R262 Difficulty in walking, not elsewhere classified: Secondary | ICD-10-CM

## 2021-09-26 NOTE — Therapy (Signed)
OUTPATIENT PHYSICAL THERAPY TREATMENT NOTE   Patient Name: Megan Porter MRN: [id number] DOB:November 16, 1940, 81 y.o., female Today's Date: 09/26/2021  PCP: Haywood Pao, MD REFERRING PROVIDER: Bo Merino, MD   PT End of Session - 09/26/21 1044     Visit Number 4    Number of Visits 17    Date for PT Re-Evaluation 10/24/21    Authorization Type [employer]HC Medicare    PT Start Time 0569    PT Stop Time 1127    PT Time Calculation (min) 42 min             Past Medical History:  Diagnosis Date   Arthritis    Cancer ([place])    Osteoarthritis    Past Surgical History:  Procedure Laterality Date   BACK SURGERY     BREAST RECONSTRUCTION     knee arthroscopic     VIDEO BRONCHOSCOPY Bilateral 07/06/2019   Procedure: VIDEO BRONCHOSCOPY WITHOUT FLUORO;  Surgeon: Marshell Garfinkel, MD;  Location: [place];  Service: Cardiopulmonary;  Laterality: Bilateral;   Patient Active Problem List   Diagnosis Date Noted   Abnormal CT scan of lung    Hypokalemia 09/21/2018   Benign essential HTN 09/21/2018   Leucocytosis 09/21/2018   Pneumonia 09/21/2018   Bronchiectasis without complication ([place]) 79/48/0165   Primary osteoarthritis of both knees 04/06/2017   DDD (degenerative disc disease), lumbar history of lumbar surgery 1970 04/06/2017   Age-related osteoporosis without current pathological fracture 04/06/2017   History of anxiety 04/06/2017   History of breast cancer 04/04/2017   Primary osteoarthritis of both feet 10/02/2016   Primary osteoarthritis of both hands 10/02/2016   High risk medications (not anticoagulants) long-term use 10/02/2016   Rheumatoid arthritis, seronegative, multiple sites ([place]) 10/02/2016    REFERRING DIAG: REFERRING DIAG: M17.0 (ICD-10-CM) - Primary osteoarthritis of both knees R26.89 (ICD-10-CM) - Poor balance     ONSET DATE: Chronic  THERAPY DIAG:  Muscle weakness (generalized)  Chronic pain of left knee  Difficulty in walking, not  elsewhere classified  Unsteadiness on feet  PERTINENT HISTORY: Previous L knee meniscal repair; increasing imbalance and instability and chronic L knee pain   PRECAUTIONS: None   WEIGHT BEARING RESTRICTIONS No  SUBJECTIVE:  Pt presents to PT with reports of L knee pain today. Notes she has been compliant with HEP with no adverse effect. Pt is ready to begin PT treatment at this time.   PAIN:  Are you having pain? No Numeric Scale: 0/10 Pain location: N/A Pain orientation: N/A  PAIN TYPE: N/A Pain description: N/A  Aggravating factors: stairs, activity  Relieving factors: avoid activity  OBJECTIVE: taken 08/29/21 unless otherwise noted    LE MMT:   MMT Right 08/29/2021 Left 08/29/2021  Hip flexion 2+/5 3/5  Hip extension      Hip abduction 3/5 3/5  Knee flexion 4/5 4/5  Knee extension 4/5 4/5   (Blank rows = not tested)   LOWER EXTREMITY SPECIAL TESTS:  N/A   FUNCTIONAL TESTS: 08/29/21 Timed up and go (TUG): 12 - w/ LoB when turning 30" STS:  10 reps - inc L knee pain SLS: R - 5 sec; L - 3 sec   GAIT: 08/29/21 Distance walked: 33f Assistive device utilized: None Level of assistance: Complete Independence Comments: trunk flexed, decreased step length, decreased gait speed  09/25/21 Treatment Therapeutic Exercise:  Nustep L6 UE/LE x 4 minutes  Step ups x 5 ea 6in -  difficult Lateral walk RTB x 2 laps in //  Heel raises 10 x 2 with cues for slow eccentrics Supine SLR 2x10 2lb ea Bridge 3x10 - BTB  S/L clamshell 2x10 BTB STS 3x10 - no UE support Row x 10 x 3 RTB Neuro Re-ED Tandem on foam 2x30" Hurdle step overs (6) fwd and lat x 2 ea SLS x 30" ea Blue rocker board - A/P rocking x 1 minute without UE (NT)  09/21/21 Treatment Therapeutic Exercise:  Nustep L 5 UE/LE x 5 minutes  Heel raises 10 x 2 with cues for slow eccentrics Blue rocker board - A/P rocking x 1 minute without UE Tandem stance x 30" ea on airex, EO Narrow stance on airex EC, EO with  head turns and nods  SLS - 30 sec right, 15 sec left  Side stepping (Red band at knees) 2 passes  retro stepping at counter - 2 passes  Supine SLR 10 x 3 Bridge  3 x 10 Supine clamshell  3 x 10 GTB cues to activate core for stabilization  Row x 10 x 3 RTB    PATIENT EDUCATION:  Education details: continue HEP Person educated: Patient Education method: Consulting civil engineer, Demonstration, and  reviewed Handouts Education comprehension: verbalized understanding, returned demonstration, and verbal cues required     HOME EXERCISE PROGRAM: Access Code: [email or web address]   ASSESSMENT:   CLINICAL IMPRESSION: Pt was able to complete all prescribed exercises with no adverse effect. Has continued weakness in L LE secondary to knee pain and buckling. Had trouble especially with L knee eccentric control. Therapy otherwise worked on improving LE strength and balance today. Will continue to progress as able per POC.     REHAB POTENTIAL: Good   CLINICAL DECISION MAKING: Stable/uncomplicated   EVALUATION COMPLEXITY: Low     GOALS:   SHORT TERM GOALS: Updated 10/11/2021   STG Name Target Date Goal status  1 Pt will be compliant and knowledgeable with 90% of HEP for improved comfort and carryover Baseline: initial HEP given 09/19/2021 MET 09/21/21  2 Pt will be able to hold tandem stance for 30" with no UE support in order to improve static balance with narrow BoS Baseline: unable 09/19/2021 MET 09/21/21    LONG TERM GOALS:    LTG Name Target Date Goal status  1 Pt will improve LE MMT to no less than 4/5 for all tested motions in order to improve functional mobility and balance Baseline: see objective 10/24/2021 INITIAL  2 Pt will improve reps in 30"STS to no less than 12 in order to improve balance, LE strength, and decrease fall risk with no inc in L knee pain Baseline: 10 reps with increased L knee pain 10/24/2021 INITIAL  3 Pt will improve SLS time to no less than 30 seconds for improved balance and  stability Baseline: R - 5 sec; L - 3 sec 10/24/2021 INITIAL    PLAN: PT FREQUENCY: 2x/week   PT DURATION: 8 weeks   PLANNED INTERVENTIONS: Therapeutic exercises, Therapeutic activity, Neuro Muscular re-education, Balance training, Gait training, Patient/Family education, Joint mobilization, and Manual therapy   PLAN FOR NEXT SESSION: eccentric strengthening L knee progress LE strength and balance as able  Ward Chatters, PT 09/26/21 12:16 PM

## 2021-09-28 ENCOUNTER — Ambulatory Visit: Payer: Medicare Other | Admitting: Physical Therapy

## 2021-09-29 ENCOUNTER — Ambulatory Visit: Payer: Medicare Other | Admitting: Physical Therapy

## 2021-10-02 ENCOUNTER — Telehealth: Payer: Self-pay | Admitting: Physical Therapy

## 2021-10-02 NOTE — Telephone Encounter (Signed)
Left voicemail regarding no show to appointment. Left next visit date and time.

## 2021-10-03 ENCOUNTER — Other Ambulatory Visit: Payer: Self-pay

## 2021-10-03 ENCOUNTER — Ambulatory Visit: Payer: Medicare Other

## 2021-10-03 DIAGNOSIS — M25562 Pain in left knee: Secondary | ICD-10-CM | POA: Diagnosis not present

## 2021-10-03 DIAGNOSIS — M6281 Muscle weakness (generalized): Secondary | ICD-10-CM

## 2021-10-03 DIAGNOSIS — G8929 Other chronic pain: Secondary | ICD-10-CM

## 2021-10-03 NOTE — Therapy (Signed)
OUTPATIENT PHYSICAL THERAPY TREATMENT NOTE   Patient Name: Megan Porter MRN: [id number] DOB:1941/04/12, 81 y.o., female Today's Date: 10/03/2021  PCP: Haywood Pao, MD REFERRING PROVIDER: Bo Merino, MD   PT End of Session - 10/03/21 1538     Visit Number 5    Number of Visits 17    Date for PT Re-Evaluation 10/24/21    Authorization Type [employer] Medicare    PT Start Time 7517    PT Stop Time 1615    PT Time Calculation (min) 40 min    Activity Tolerance Patient tolerated treatment well    Behavior During Therapy WFL for tasks assessed/performed              Past Medical History:  Diagnosis Date   Arthritis    Cancer ([place])    Osteoarthritis    Past Surgical History:  Procedure Laterality Date   BACK SURGERY     BREAST RECONSTRUCTION     knee arthroscopic     VIDEO BRONCHOSCOPY Bilateral 07/06/2019   Procedure: VIDEO BRONCHOSCOPY WITHOUT FLUORO;  Surgeon: Marshell Garfinkel, MD;  Location: [place];  Service: Cardiopulmonary;  Laterality: Bilateral;   Patient Active Problem List   Diagnosis Date Noted   Abnormal CT scan of lung    Hypokalemia 09/21/2018   Benign essential HTN 09/21/2018   Leucocytosis 09/21/2018   Pneumonia 09/21/2018   Bronchiectasis without complication ([place]) 00/17/4944   Primary osteoarthritis of both knees 04/06/2017   DDD (degenerative disc disease), lumbar history of lumbar surgery 1970 04/06/2017   Age-related osteoporosis without current pathological fracture 04/06/2017   History of anxiety 04/06/2017   History of breast cancer 04/04/2017   Primary osteoarthritis of both feet 10/02/2016   Primary osteoarthritis of both hands 10/02/2016   High risk medications (not anticoagulants) long-term use 10/02/2016   Rheumatoid arthritis, seronegative, multiple sites ([place]) 10/02/2016    REFERRING DIAG: REFERRING DIAG: M17.0 (ICD-10-CM) - Primary osteoarthritis of both knees R26.89 (ICD-10-CM) - Poor balance     ONSET DATE:  Chronic  THERAPY DIAG:  Muscle weakness (generalized)  Chronic pain of left knee  PERTINENT HISTORY: Previous L knee meniscal repair; increasing imbalance and instability and chronic L knee pain    SUBJECTIVE:  Pt presents to PT with no reports of L knee pain today. Notes she has been compliant with HEP with no adverse effect. Pt is ready to begin PT treatment at this time.   PAIN:  Are you having pain? No Numeric Scale: 0/10 Pain location: N/A Pain orientation: N/A  PAIN TYPE: N/A Pain description: N/A  Aggravating factors: stairs, activity  Relieving factors: avoid activity  OBJECTIVE: taken 08/29/21 unless otherwise noted    LE MMT:   MMT Right 08/29/2021 Left 08/29/2021  Hip flexion 2+/5 3/5  Hip extension      Hip abduction 3/5 3/5  Knee flexion 4/5 4/5  Knee extension 4/5 4/5   (Blank rows = not tested)   LOWER EXTREMITY SPECIAL TESTS:  N/A   FUNCTIONAL TESTS: 08/29/21 Timed up and go (TUG): 12 - w/ LoB when turning 30" STS:  10 reps - inc L knee pain SLS: R - 5 sec; L - 3 sec   GAIT: 08/29/21 Distance walked: 32f Assistive device utilized: None Level of assistance: Complete Independence Comments: trunk flexed, decreased step length, decreased gait speed  10/03/21 Treatment Therapeutic Exercise:  Nustep L6 UE/LE x 3 minutes  Step ups L 2x10 2in Lateral walk RTB x 3 laps at counter Standing  hip abd/ext 2x10 RTB ea Heel raises 10 x 2 with cues for slow eccentrics Supine SLR 2x10 3lb ea LAQ 3x10 4lb STS 3x10 - 10lb KB Row 3x12 black TB Neuro Re-ED Tandem on foam 2x30" SLS x 30" ea   09/25/21 Treatment Therapeutic Exercise:  Nustep L6 UE/LE x 4 minutes  Step ups x 5 ea 6in -  difficult Lateral walk RTB x 2 laps in // Heel raises 10 x 2 with cues for slow eccentrics Supine SLR 2x10 2lb ea Bridge 3x10 - BTB  S/L clamshell 2x10 BTB STS 3x10 - no UE support Row x 10 x 3 RTB Neuro Re-ED Tandem on foam 2x30" Hurdle step overs (6) fwd and lat  x 2 ea SLS x 30" ea Blue rocker board - A/P rocking x 1 minute without UE (NT)  09/21/21 Treatment Therapeutic Exercise:  Nustep L 5 UE/LE x 5 minutes  Heel raises 10 x 2 with cues for slow eccentrics Blue rocker board - A/P rocking x 1 minute without UE Tandem stance x 30" ea on airex, EO Narrow stance on airex EC, EO with head turns and nods  SLS - 30 sec right, 15 sec left  Side stepping (Red band at knees) 2 passes  retro stepping at counter - 2 passes  Supine SLR 10 x 3 Bridge  3 x 10 Supine clamshell  3 x 10 GTB cues to activate core for stabilization  Row x 10 x 3 RTB    PATIENT EDUCATION:  Education details: continue HEP Person educated: Patient Education method: Consulting civil engineer, Demonstration, and  reviewed Handouts Education comprehension: verbalized understanding, returned demonstration, and verbal cues required     HOME EXERCISE PROGRAM: Access Code: [email or web address]   ASSESSMENT:   CLINICAL IMPRESSION: Pt was able to complete all prescribed exercise with no adverse effect. She demonstrates continued improvement and progression with therapy, able to hold tandem on foam with no UE assistance needed and improving LE strength/endurance. Therapy today focused on improving LE strength and improving static balance. She continues to benefit from skilled PT services and should continue to be seen and progressed as able.   REHAB POTENTIAL: Good   CLINICAL DECISION MAKING: Stable/uncomplicated   EVALUATION COMPLEXITY: Low     GOALS:   SHORT TERM GOALS: Updated 10/11/2021   STG Name Target Date Goal status  1 Pt will be compliant and knowledgeable with 90% of HEP for improved comfort and carryover Baseline: initial HEP given 09/19/2021 MET 09/21/21  2 Pt will be able to hold tandem stance for 30" with no UE support in order to improve static balance with narrow BoS Baseline: unable 09/19/2021 MET 09/21/21    LONG TERM GOALS:    LTG Name Target Date Goal status  1 Pt will improve  LE MMT to no less than 4/5 for all tested motions in order to improve functional mobility and balance Baseline: see objective 10/24/2021 INITIAL  2 Pt will improve reps in 30"STS to no less than 12 in order to improve balance, LE strength, and decrease fall risk with no inc in L knee pain Baseline: 10 reps with increased L knee pain 10/24/2021 INITIAL  3 Pt will improve SLS time to no less than 30 seconds for improved balance and stability Baseline: R - 5 sec; L - 3 sec 10/24/2021 INITIAL    PLAN: PT FREQUENCY: 2x/week   PT DURATION: 8 weeks   PLANNED INTERVENTIONS: Therapeutic exercises, Therapeutic activity, Neuro Muscular re-education, Balance training, Gait training, Patient/Family education,  Joint mobilization, and Manual therapy   PLAN FOR NEXT SESSION: eccentric strengthening L knee progress LE strength and balance as able  Ward Chatters, PT 10/03/21 5:08 PM

## 2021-10-05 ENCOUNTER — Ambulatory Visit: Payer: Medicare Other | Admitting: Physical Therapy

## 2021-10-06 ENCOUNTER — Other Ambulatory Visit: Payer: Self-pay

## 2021-10-06 ENCOUNTER — Ambulatory Visit: Payer: Medicare Other | Admitting: Physical Therapy

## 2021-10-06 ENCOUNTER — Encounter: Payer: Self-pay | Admitting: Physical Therapy

## 2021-10-06 DIAGNOSIS — M25562 Pain in left knee: Secondary | ICD-10-CM

## 2021-10-06 DIAGNOSIS — R2681 Unsteadiness on feet: Secondary | ICD-10-CM

## 2021-10-06 DIAGNOSIS — G8929 Other chronic pain: Secondary | ICD-10-CM

## 2021-10-06 DIAGNOSIS — M6281 Muscle weakness (generalized): Secondary | ICD-10-CM

## 2021-10-06 DIAGNOSIS — R262 Difficulty in walking, not elsewhere classified: Secondary | ICD-10-CM

## 2021-10-06 NOTE — Therapy (Signed)
OUTPATIENT PHYSICAL THERAPY TREATMENT NOTE   Patient Name: Megan Porter MRN: [id number] DOB:Jun 06, 1941, 81 y.o., female Today's Date: 10/06/2021  PCP: Haywood Pao, MD REFERRING PROVIDER: Bo Merino, MD   PT End of Session - 10/06/21 1201     Visit Number 6    Number of Visits 17    Date for PT Re-Evaluation 10/24/21    Authorization Type [employer] Medicare    PT Start Time 1147    PT Stop Time 1230    PT Time Calculation (min) 43 min              Past Medical History:  Diagnosis Date   Arthritis    Cancer ([place])    Osteoarthritis    Past Surgical History:  Procedure Laterality Date   BACK SURGERY     BREAST RECONSTRUCTION     knee arthroscopic     VIDEO BRONCHOSCOPY Bilateral 07/06/2019   Procedure: VIDEO BRONCHOSCOPY WITHOUT FLUORO;  Surgeon: Marshell Garfinkel, MD;  Location: [place];  Service: Cardiopulmonary;  Laterality: Bilateral;   Patient Active Problem List   Diagnosis Date Noted   Abnormal CT scan of lung    Hypokalemia 09/21/2018   Benign essential HTN 09/21/2018   Leucocytosis 09/21/2018   Pneumonia 09/21/2018   Bronchiectasis without complication ([place]) 26/20/3559   Primary osteoarthritis of both knees 04/06/2017   DDD (degenerative disc disease), lumbar history of lumbar surgery 1970 04/06/2017   Age-related osteoporosis without current pathological fracture 04/06/2017   History of anxiety 04/06/2017   History of breast cancer 04/04/2017   Primary osteoarthritis of both feet 10/02/2016   Primary osteoarthritis of both hands 10/02/2016   High risk medications (not anticoagulants) long-term use 10/02/2016   Rheumatoid arthritis, seronegative, multiple sites ([place]) 10/02/2016    REFERRING DIAG: REFERRING DIAG: M17.0 (ICD-10-CM) - Primary osteoarthritis of both knees R26.89 (ICD-10-CM) - Poor balance     ONSET DATE: Chronic  THERAPY DIAG:  Muscle weakness (generalized)  Chronic pain of left knee  Difficulty in walking, not  elsewhere classified  Unsteadiness on feet  PERTINENT HISTORY: Previous L knee meniscal repair; increasing imbalance and instability and chronic L knee pain    SUBJECTIVE: Pt reports a funny feeling of numbness in left leg today. She reports that it is not painful. She notes more mobility in knees and improvement in back pain since starting PT. She reports improved ability to don LE clothes while standing due to improved balance.    PAIN:  Are you having pain? No Numeric Scale: 0/10 Pain location: N/A Pain orientation: N/A  PAIN TYPE: N/A Pain description: N/A  Aggravating factors: stairs, activity  Relieving factors: avoid activity  OBJECTIVE: taken 08/29/21 unless otherwise noted    LE MMT:   MMT Right 08/29/2021 Left 08/29/2021  Hip flexion 2+/5 3/5  Hip extension      Hip abduction 3/5 3/5  Knee flexion 4/5 4/5  Knee extension 4/5 4/5   (Blank rows = not tested)   LOWER EXTREMITY SPECIAL TESTS:  N/A   FUNCTIONAL TESTS: 08/29/21 Timed up and go (TUG): 12 - w/ LoB when turning 30" STS:  10 reps - inc L knee pain SLS: R - 5 sec; L - 3 sec Functional TEST: 10/06/21  5 x STS 12.5 sec  FOTO Survey  61% intake, 59% status on 10/06/21   GAIT: 08/29/21 Distance walked: 38f Assistive device utilized: None Level of assistance: Complete Independence Comments: trunk flexed, decreased step length, decreased gait speed  10/06/21 Treatment:  Therapeutic Exercise:  Nustep L6 UE/LE x 3 minutes  Step ups bilat 1x10 4in (reduced to 2 inch due to increased pain on left ) Standing hip abd/ext 2x10 RTB ea Heel raises 10 x 2 with cues for slow eccentrics Supine SLR 2x10 3lb ea LAQ 3x10 4lb STS 2x10, 1 x 10 with 10#  Row 3x12 blue  TB Neuro Re-ED Tandem on foam 60 sec R foot back, 30 sed L foot back   10/03/21 Treatment Therapeutic Exercise:  Nustep L6 UE/LE x 3 minutes  Step ups L 2x10 2in Lateral walk RTB x 3 laps at counter Standing hip abd/ext 2x10 RTB ea Heel  raises 10 x 2 with cues for slow eccentrics Supine SLR 2x10 3lb ea LAQ 3x10 4lb STS 3x10 - 10lb KB Row 3x12 black TB Neuro Re-ED Tandem on foam 2x30" SLS x 30" ea   09/25/21 Treatment Therapeutic Exercise:  Nustep L6 UE/LE x 4 minutes  Step ups x 5 ea 6in -  difficult Lateral walk RTB x 2 laps in // Heel raises 10 x 2 with cues for slow eccentrics Supine SLR 2x10 2lb ea Bridge 3x10 - BTB  S/L clamshell 2x10 BTB STS 3x10 - no UE support Row x 10 x 3 RTB Neuro Re-ED Tandem on foam 2x30" Hurdle step overs (6) fwd and lat x 2 ea SLS x 30" ea Blue rocker board - A/P rocking x 1 minute without UE (NT)  09/21/21 Treatment Therapeutic Exercise:  Nustep L 5 UE/LE x 5 minutes  Heel raises 10 x 2 with cues for slow eccentrics Blue rocker board - A/P rocking x 1 minute without UE Tandem stance x 30" ea on airex, EO Narrow stance on airex EC, EO with head turns and nods  SLS - 30 sec right, 15 sec left  Side stepping (Red band at knees) 2 passes  retro stepping at counter - 2 passes  Supine SLR 10 x 3 Bridge  3 x 10 Supine clamshell  3 x 10 GTB cues to activate core for stabilization  Row x 10 x 3 RTB    PATIENT EDUCATION:  Education details: continue HEP Person educated: Patient Education method: Consulting civil engineer, Demonstration, and  reviewed Handouts Education comprehension: verbalized understanding, returned demonstration, and verbal cues required     HOME EXERCISE PROGRAM: Access Code: [email or web address]   ASSESSMENT:   CLINICAL IMPRESSION: Pt reports left LE feels  a little numb today but not painful. Attempted to increased step up height to 4 inches with pt unable to tolerate due to L knee pain. She reports improved balance confidence and less back pain. She also reports improvement in ability to stand and don her LE clothing.  Her FOTO score slightly decreased compared to intake. Therapy today focused on improving LE strength and improving static balance. She continues to benefit  from skilled PT services and should continue to be seen and progressed as able.   REHAB POTENTIAL: Good   CLINICAL DECISION MAKING: Stable/uncomplicated   EVALUATION COMPLEXITY: Low     GOALS:   SHORT TERM GOALS: Updated 10/11/2021   STG Name Target Date Goal status  1 Pt will be compliant and knowledgeable with 90% of HEP for improved comfort and carryover Baseline: initial HEP given 09/19/2021 MET 09/21/21  2 Pt will be able to hold tandem stance for 30" with no UE support in order to improve static balance with narrow BoS Baseline: unable 09/19/2021 MET 09/21/21    LONG TERM GOALS:    LTG  Name Target Date Goal status  1 Pt will improve LE MMT to no less than 4/5 for all tested motions in order to improve functional mobility and balance Baseline: see objective 10/24/2021 INITIAL  2 Pt will improve reps in 30"STS to no less than 12 in order to improve balance, LE strength, and decrease fall risk with no inc in L knee pain Baseline: 10 reps with increased L knee pain 10/24/2021 INITIAL  3 Pt will improve SLS time to no less than 30 seconds for improved balance and stability Baseline: R - 5 sec; L - 3 sec 10/24/2021 INITIAL    PLAN: PT FREQUENCY: 2x/week   PT DURATION: 8 weeks   PLANNED INTERVENTIONS: Therapeutic exercises, Therapeutic activity, Neuro Muscular re-education, Balance training, Gait training, Patient/Family education, Joint mobilization, and Manual therapy   PLAN FOR NEXT SESSION: check progress toward LTGS , FOTO at 10th visit, eccentric strengthening L knee progress LE strength and balance as able  Hessie Diener, PTA 10/06/21 1:28 PM Phone: [phone] Fax: [phone]

## 2021-10-10 ENCOUNTER — Other Ambulatory Visit: Payer: Self-pay

## 2021-10-10 ENCOUNTER — Ambulatory Visit: Payer: Medicare Other | Admitting: Physical Therapy

## 2021-10-10 ENCOUNTER — Encounter: Payer: Self-pay | Admitting: Physical Therapy

## 2021-10-10 DIAGNOSIS — R2681 Unsteadiness on feet: Secondary | ICD-10-CM

## 2021-10-10 DIAGNOSIS — R262 Difficulty in walking, not elsewhere classified: Secondary | ICD-10-CM

## 2021-10-10 DIAGNOSIS — M25562 Pain in left knee: Secondary | ICD-10-CM

## 2021-10-10 DIAGNOSIS — M6281 Muscle weakness (generalized): Secondary | ICD-10-CM

## 2021-10-10 NOTE — Therapy (Signed)
OUTPATIENT PHYSICAL THERAPY TREATMENT NOTE   Patient Name: Megan Porter MRN: [id number] DOB:Jan 09, 1941, 81 y.o., female Today's Date: 10/10/2021  PCP: Haywood Pao, MD REFERRING PROVIDER: Haywood Pao, MD   PT End of Session - 10/10/21 1406     Visit Number 7    Number of Visits 17    Date for PT Re-Evaluation 10/24/21    Authorization Type [employer] Medicare    PT Start Time 0207    PT Stop Time 0248    PT Time Calculation (min) 41 min    Activity Tolerance Patient tolerated treatment well    Behavior During Therapy [facility] for tasks assessed/performed              Past Medical History:  Diagnosis Date   Arthritis    Cancer ([place])    Osteoarthritis    Past Surgical History:  Procedure Laterality Date   BACK SURGERY     BREAST RECONSTRUCTION     knee arthroscopic     VIDEO BRONCHOSCOPY Bilateral 07/06/2019   Procedure: VIDEO BRONCHOSCOPY WITHOUT FLUORO;  Surgeon: Marshell Garfinkel, MD;  Location: [place];  Service: Cardiopulmonary;  Laterality: Bilateral;   Patient Active Problem List   Diagnosis Date Noted   Abnormal CT scan of lung    Hypokalemia 09/21/2018   Benign essential HTN 09/21/2018   Leucocytosis 09/21/2018   Pneumonia 09/21/2018   Bronchiectasis without complication ([place]) 66/59/9357   Primary osteoarthritis of both knees 04/06/2017   DDD (degenerative disc disease), lumbar history of lumbar surgery 1970 04/06/2017   Age-related osteoporosis without current pathological fracture 04/06/2017   History of anxiety 04/06/2017   History of breast cancer 04/04/2017   Primary osteoarthritis of both feet 10/02/2016   Primary osteoarthritis of both hands 10/02/2016   High risk medications (not anticoagulants) long-term use 10/02/2016   Rheumatoid arthritis, seronegative, multiple sites ([place]) 10/02/2016    REFERRING DIAG: REFERRING DIAG: M17.0 (ICD-10-CM) - Primary osteoarthritis of both knees R26.89 (ICD-10-CM) - Poor balance     ONSET DATE:  Chronic  THERAPY DIAG:  Muscle weakness (generalized)  Chronic pain of left knee  Difficulty in walking, not elsewhere classified  Unsteadiness on feet  PERTINENT HISTORY: Previous L knee meniscal repair; increasing imbalance and instability and chronic L knee pain    SUBJECTIVE: No pain really.  Left knee is okay.    PAIN:  Are you having pain? No Numeric Scale: 0/10 Pain location: N/A Pain orientation: N/A  PAIN TYPE: N/A Pain description: N/A  Aggravating factors: stairs, activity  Relieving factors: avoid activity  OBJECTIVE: taken 08/29/21 unless otherwise noted    LE MMT:   MMT Right 08/29/2021 Left 08/29/2021 Right 10/10/21 Left  10/10/21  Hip flexion 2+/5 3/5 4+/5 4/5  Hip extension        Hip abduction 3/5 3/5    Knee flexion 4/5 4/5    Knee extension 4/5 4/5     (Blank rows = not tested)   LOWER EXTREMITY SPECIAL TESTS:  N/A   FUNCTIONAL TESTS: 08/29/21 Timed up and go (TUG): 12 - w/ LoB when turning 30" STS:  10 reps - inc L knee pain SLS: R - 5 sec; L - 3 sec Functional TEST: 10/06/21  5 x STS 12.5 sec  FOTO Survey  61% intake, 59% status on 10/06/21   GAIT: 08/29/21 Distance walked: 45f Assistive device utilized: None Level of assistance: Complete Independence Comments: trunk flexed, decreased step length, decreased gait speed   10/10/21 Treatment:  Therapeutic  Exercise:  Nustep L4 UE/LE x 5 minutes  Horiz. Leg press 1 plate bilat, then with left eccentric lowering SLS on foam with 3 way hip x 10 each bilat  Blue rocker board A/P weight shifting x 1 minute  Heel raises 10 x 1 with cues for slow eccentrics LAQ 3x10 5lb Supine SLR 2x10 3lb ea STS 2x10, 1 x 15 with 15# KB Neuro Re-ED Tandem on foam 30 sec R foot back, 30 sec L foot back   10/06/21 Treatment:  Therapeutic Exercise:  Nustep L6 UE/LE x 3 minutes  Step ups bilat 1x10 4in (reduced to 2 inch due to increased pain on left ) Standing hip abd/ext 2x10 RTB ea Heel raises  10 x 2 with cues for slow eccentrics  Supine SLR 2x10 3lb ea LAQ 3x10 4lb STS 2x10, 1 x 10 with 10#  Row 3x12 blue  TB Neuro Re-ED Tandem on foam 60 sec R foot back, 30 sed L foot back   10/03/21 Treatment Therapeutic Exercise:  Nustep L6 UE/LE x 3 minutes  Step ups L 2x10 2in Lateral walk RTB x 3 laps at counter Standing hip abd/ext 2x10 RTB ea Heel raises 10 x 2 with cues for slow eccentrics Supine SLR 3x10 3lb ea LAQ 3x10 4lb STS 3x10 - 10lb KB Row 3x12 black TB Neuro Re-ED Tandem on foam 2x30" SLS x 30" ea     PATIENT EDUCATION:  Education details: continue HEP Person educated: Patient Education method: Consulting civil engineer, Demonstration, and  reviewed Handouts Education comprehension: verbalized understanding, returned demonstration, and verbal cues required     HOME EXERCISE PROGRAM: Access Code: [email or web address]   ASSESSMENT:   CLINICAL IMPRESSION: Pt reports left knee is still painful with stair climbing. She had to climb stairs at a basketball game using step to pattern. Began leg press for knee strengthening which she tolerated well. Hip flexion strength improved. Progressing toward LTGS. Therapy today focused on improving LE strength and improving static balance. She continues to benefit from skilled PT services and should continue to be seen and progressed as able.   REHAB POTENTIAL: Good   CLINICAL DECISION MAKING: Stable/uncomplicated   EVALUATION COMPLEXITY: Low     GOALS:   SHORT TERM GOALS: Updated 10/11/2021   STG Name Target Date Goal status  1 Pt will be compliant and knowledgeable with 90% of HEP for improved comfort and carryover Baseline: initial HEP given 09/19/2021 MET 09/21/21  2 Pt will be able to hold tandem stance for 30" with no UE support in order to improve static balance with narrow BoS Baseline: unable 09/19/2021 MET 09/21/21    LONG TERM GOALS:    LTG Name Target Date Goal status  1 Pt will improve LE MMT to no less than 4/5 for all tested  motions in order to improve functional mobility and balance Baseline: see objective 10/24/2021 INITIAL  2 Pt will improve reps in 30"STS to no less than 12 in order to improve balance, LE strength, and decrease fall risk with no inc in L knee pain Baseline: 10 reps with increased L knee pain 10/24/2021 INITIAL  3 Pt will improve SLS time to no less than 30 seconds for improved balance and stability Baseline: R - 5 sec; L - 3 sec 10/24/2021 INITIAL    PLAN: PT FREQUENCY: 2x/week   PT DURATION: 8 weeks   PLANNED INTERVENTIONS: Therapeutic exercises, Therapeutic activity, Neuro Muscular re-education, Balance training, Gait training, Patient/Family education, Joint mobilization, and Manual therapy   PLAN FOR  NEXT SESSION: check progress toward LTGS , FOTO at 10th visit, eccentric strengthening L knee progress LE strength and balance as able  Hessie Diener, PTA 10/10/21 2:55 PM Phone: [phone] Fax: [phone]

## 2021-10-12 ENCOUNTER — Other Ambulatory Visit: Payer: Self-pay

## 2021-10-12 ENCOUNTER — Encounter: Payer: Self-pay | Admitting: Physical Therapy

## 2021-10-12 ENCOUNTER — Ambulatory Visit: Payer: Medicare Other | Admitting: Physical Therapy

## 2021-10-12 DIAGNOSIS — R2681 Unsteadiness on feet: Secondary | ICD-10-CM

## 2021-10-12 DIAGNOSIS — G8929 Other chronic pain: Secondary | ICD-10-CM

## 2021-10-12 DIAGNOSIS — M25562 Pain in left knee: Secondary | ICD-10-CM

## 2021-10-12 DIAGNOSIS — M6281 Muscle weakness (generalized): Secondary | ICD-10-CM

## 2021-10-12 DIAGNOSIS — R262 Difficulty in walking, not elsewhere classified: Secondary | ICD-10-CM

## 2021-10-12 NOTE — Therapy (Signed)
OUTPATIENT PHYSICAL THERAPY TREATMENT NOTE   Patient Name: Megan Porter MRN: [id number] DOB:[birth year], 81 y.o., female Today's Date: 10/12/2021  PCP: Haywood Pao, MD REFERRING PROVIDER: Bo Merino, MD   PT End of Session - 10/12/21 1102     Visit Number 8    Number of Visits 18    Date for PT Re-Evaluation 10/24/21    Authorization Type UHC Medicare    PT Start Time 1102    PT Stop Time 1144    PT Time Calculation (min) 42 min    Activity Tolerance Patient tolerated treatment well    Behavior During Therapy WFL for tasks assessed/performed              Past Medical History:  Diagnosis Date   Arthritis    Cancer ([place])    Osteoarthritis    Past Surgical History:  Procedure Laterality Date   BACK SURGERY     BREAST RECONSTRUCTION     knee arthroscopic     VIDEO BRONCHOSCOPY Bilateral 07/06/2019   Procedure: VIDEO BRONCHOSCOPY WITHOUT FLUORO;  Surgeon: Marshell Garfinkel, MD;  Location: [facility];  Service: Cardiopulmonary;  Laterality: Bilateral;   Patient Active Problem List   Diagnosis Date Noted   Abnormal CT scan of lung    Hypokalemia 09/21/2018   Benign essential HTN 09/21/2018   Leucocytosis 09/21/2018   Pneumonia 09/21/2018   Bronchiectasis without complication ([place]) 25/49/8264   Primary osteoarthritis of both knees 04/06/2017   DDD (degenerative disc disease), lumbar history of lumbar surgery 1970 04/06/2017   Age-related osteoporosis without current pathological fracture 04/06/2017   History of anxiety 04/06/2017   History of breast cancer 04/04/2017   Primary osteoarthritis of both feet 10/02/2016   Primary osteoarthritis of both hands 10/02/2016   High risk medications (not anticoagulants) long-term use 10/02/2016   Rheumatoid arthritis, seronegative, multiple sites ([place]) 10/02/2016    REFERRING DIAG: REFERRING DIAG: M17.0 (ICD-10-CM) - Primary osteoarthritis of both knees R26.89 (ICD-10-CM) - Poor balance     ONSET DATE:  Chronic  THERAPY DIAG:  Muscle weakness (generalized)  Chronic pain of left knee  Difficulty in walking, not elsewhere classified  Unsteadiness on feet  PERTINENT HISTORY: Previous L knee meniscal repair; increasing imbalance and instability and chronic L knee pain    SUBJECTIVE: Left knee hurts when I use it after last session. I didn't do any exercises yesterday because it was sore.   PAIN:  Are you having pain? Yes Numeric Scale: 6/10 Pain location: L knee Pain orientation: N/A  PAIN TYPE: N/A Pain description: Soreness Aggravating factors: stairs, activity  Relieving factors: avoid activity  OBJECTIVE: taken 08/29/21 unless otherwise noted    LE MMT:   MMT Right 08/29/2021 Left 08/29/2021 Right 10/10/21 Left  10/10/21  Hip flexion 2+/5 3/5 4+/5 4/5  Hip extension        Hip abduction 3/5 3/5    Knee flexion 4/5 4/5    Knee extension 4/5 4/5     (Blank rows = not tested)   LOWER EXTREMITY SPECIAL TESTS:  N/A   FUNCTIONAL TESTS: 08/29/21 Timed up and go (TUG): 12 - w/ LoB when turning 30" STS:  10 reps - inc L knee pain SLS: R - 5 sec; L - 3 sec Functional TEST: 10/06/21  5 x STS 12.5 sec  FOTO Survey  61% intake, 59% status on 10/06/21   GAIT: 08/29/21 Distance walked: 35f Assistive device utilized: None Level of assistance: Complete Independence Comments: trunk flexed, decreased step  length, decreased gait speed  10/12/21 Treatment:  Therapeutic Exercise:  Nustep L4 UE/LE x 5 minutes  SLS on foam with 3 way hip x 10 each bilat  Blue rocker board A/P weight shifting x 1 minute  Heel raises 10 x 1 with cues for slow eccentrics  LAQ 3x10 5lb  Supine SLR 2x10 3lb ea  STS 2x10, 1 x 15 with 10# KB  Supine clammshells GTB 3 x 10 bil   Bridges 3 x 10  Rows Black TB 2 x 10 Neuro Re-ED: Tandem on foam 30 sec R foot back, 30 sec L foot back  10/10/21 Treatment:  Therapeutic Exercise:  Nustep L4 UE/LE x 5 minutes  Horiz. Leg press 1 plate bilat,  then with left eccentric lowering SLS on foam with 3 way hip x 10 each bilat  Blue rocker board A/P weight shifting x 1 minute  Heel raises 10 x 1 with cues for slow eccentrics LAQ 3x10 5lb Supine SLR 2x10 3lb ea STS 2x10, 1 x 15 with 15# KB Neuro Re-ED Tandem on foam 30 sec R foot back, 30 sec L foot back   10/06/21 Treatment:  Therapeutic Exercise:  Nustep L6 UE/LE x 3 minutes  Step ups bilat 1x10 4in (reduced to 2 inch due to increased pain on left ) Standing hip abd/ext 2x10 RTB ea Heel raises 10 x 2 with cues for slow eccentrics  Supine SLR 2x10 3lb ea LAQ 3x10 4lb STS 2x10, 1 x 10 with 10#  Row 3x12 blue  TB Neuro Re-ED Tandem on foam 60 sec R foot back, 30 sed L foot back     PATIENT EDUCATION:  Education details: continue HEP Person educated: Patient Education method: Consulting civil engineer, Demonstration, and  reviewed Handouts Education comprehension: verbalized understanding, returned demonstration, and verbal cues required     HOME EXERCISE PROGRAM: Access Code: [email or web address]   ASSESSMENT:   CLINICAL IMPRESSION: Pt reports left knee was very sore after the last session and wasn't able to perform exercises yesterday due to pain. She feels like the leg press machine from last session caused the soreness. Patient is progressing toward LTGS. Therapy today focused on improving LE strength and improving static balance, pt able to perform tandem stance with no UE support and no LOB this session. Patient continues to benefit from skilled PT services and should be progressed as able to improve functional independence.    REHAB POTENTIAL: Good   CLINICAL DECISION MAKING: Stable/uncomplicated   EVALUATION COMPLEXITY: Low     GOALS:   SHORT TERM GOALS: Updated 10/11/2021   STG Name Target Date Goal status  1 Pt will be compliant and knowledgeable with 90% of HEP for improved comfort and carryover Baseline: initial HEP given 09/19/2021 MET 09/21/21  2 Pt will be able to hold tandem  stance for 30" with no UE support in order to improve static balance with narrow BoS Baseline: unable 09/19/2021 MET 09/21/21    LONG TERM GOALS:    LTG Name Target Date Goal status  1 Pt will improve LE MMT to no less than 4/5 for all tested motions in order to improve functional mobility and balance Baseline: see objective 10/24/2021 INITIAL  2 Pt will improve reps in 30"STS to no less than 12 in order to improve balance, LE strength, and decrease fall risk with no inc in L knee pain Baseline: 10 reps with increased L knee pain 10/24/2021 INITIAL  3 Pt will improve SLS time to no less than 30 seconds  for improved balance and stability Baseline: R - 5 sec; L - 3 sec 10/24/2021 INITIAL    PLAN: PT FREQUENCY: 2x/week   PT DURATION: 8 weeks   PLANNED INTERVENTIONS: Therapeutic exercises, Therapeutic activity, Neuro Muscular re-education, Balance training, Gait training, Patient/Family education, Joint mobilization, and Manual therapy   PLAN FOR NEXT SESSION: check progress toward LTGS , FOTO at 10th visit, eccentric strengthening L knee progress LE strength and balance as able, tandem on airex with head turns  Evelene Croon, PTA

## 2021-10-17 ENCOUNTER — Ambulatory Visit: Payer: Medicare Other | Admitting: Physical Therapy

## 2021-10-19 ENCOUNTER — Ambulatory Visit: Payer: Medicare Other

## 2021-10-24 ENCOUNTER — Ambulatory Visit: Payer: Medicare Other | Attending: Rheumatology | Admitting: Physical Therapy

## 2021-10-24 ENCOUNTER — Encounter: Payer: Self-pay | Admitting: Physical Therapy

## 2021-10-24 ENCOUNTER — Other Ambulatory Visit: Payer: Self-pay

## 2021-10-24 DIAGNOSIS — R2681 Unsteadiness on feet: Secondary | ICD-10-CM | POA: Diagnosis present

## 2021-10-24 DIAGNOSIS — G8929 Other chronic pain: Secondary | ICD-10-CM

## 2021-10-24 DIAGNOSIS — M6281 Muscle weakness (generalized): Secondary | ICD-10-CM

## 2021-10-24 DIAGNOSIS — M25562 Pain in left knee: Secondary | ICD-10-CM | POA: Diagnosis present

## 2021-10-24 DIAGNOSIS — R262 Difficulty in walking, not elsewhere classified: Secondary | ICD-10-CM

## 2021-10-24 NOTE — Therapy (Addendum)
OUTPATIENT PHYSICAL THERAPY TREATMENT NOTE/DISCHARGE   Patient Name: Megan Porter MRN: [id number] DOB:14-Sep-1941, 81 y.o., female Today's Date: 10/24/2021  PCP: Haywood Pao, MD REFERRING PROVIDER: Haywood Pao, MD   PT End of Session - 10/24/21 1106     Visit Number 9    Number of Visits 18    Date for PT Re-Evaluation 10/24/21    Authorization Type UHC Medicare    PT Start Time 1103    PT Stop Time 1145    PT Time Calculation (min) 42 min              Past Medical History:  Diagnosis Date   Arthritis    Cancer ([place])    Osteoarthritis    Past Surgical History:  Procedure Laterality Date   BACK SURGERY     BREAST RECONSTRUCTION     knee arthroscopic     VIDEO BRONCHOSCOPY Bilateral 07/06/2019   Procedure: VIDEO BRONCHOSCOPY WITHOUT FLUORO;  Surgeon: Marshell Garfinkel, MD;  Location: [facility];  Service: Cardiopulmonary;  Laterality: Bilateral;   Patient Active Problem List   Diagnosis Date Noted   Abnormal CT scan of lung    Hypokalemia 09/21/2018   Benign essential HTN 09/21/2018   Leucocytosis 09/21/2018   Pneumonia 09/21/2018   Bronchiectasis without complication ([place]) 16/03/3709   Primary osteoarthritis of both knees 04/06/2017   DDD (degenerative disc disease), lumbar history of lumbar surgery 1970 04/06/2017   Age-related osteoporosis without current pathological fracture 04/06/2017   History of anxiety 04/06/2017   History of breast cancer 04/04/2017   Primary osteoarthritis of both feet 10/02/2016   Primary osteoarthritis of both hands 10/02/2016   High risk medications (not anticoagulants) long-term use 10/02/2016   Rheumatoid arthritis, seronegative, multiple sites ([place]) 10/02/2016    REFERRING DIAG: REFERRING DIAG: M17.0 (ICD-10-CM) - Primary osteoarthritis of both knees R26.89 (ICD-10-CM) - Poor balance     ONSET DATE: Chronic  THERAPY DIAG:  Muscle weakness (generalized)  Chronic pain of left knee  Difficulty in  walking, not elsewhere classified  Unsteadiness on feet  PERTINENT HISTORY: Previous L knee meniscal repair; increasing imbalance and instability and chronic L knee pain    SUBJECTIVE: The knee is much better. I still cannot use the left knee for the stairs. I feel better and have more energy. I can get up and down from chairs better. Walking tolerance has improved.    PAIN:  Are you having pain? Yes Numeric Scale: 0/10, can be 8/10 at worst with stairs  Pain location: L knee Pain orientation: N/A  PAIN TYPE: N/A Pain description: Soreness Aggravating factors: stairs, activity  Relieving factors: avoid activity  OBJECTIVE: taken 08/29/21 unless otherwise noted    LE MMT:   MMT Right 08/29/2021 Left 08/29/2021 Right 10/10/21 Left  10/10/21 Right 10/24/21 Left  10/24/21  Hip flexion 2+/5 3/5 4+/5 4/5 4+/5 4/5  Hip extension          Hip abduction 3/5 3/5   3+/5 4-/5  Knee flexion 4/5 4/5   5/5 5/5  Knee extension 4/5 4/5   5/5 4+/5   (Blank rows = not tested)   LOWER EXTREMITY SPECIAL TESTS:  N/A   FUNCTIONAL TESTS: 08/29/21 Timed up and go (TUG): 12 - w/ LoB when turning 30" STS:  10 reps - inc L knee pain, 10/24/21: 13 reps -no knee pain SLS: R - 5 sec; L - 3 sec  Functional TEST: 10/06/21  5 x STS 12.5 sec  FOTO Survey  61% intake, 59% status on 10/06/21, status 61% on 10/24/21   GAIT: 08/29/21 Distance walked: 68f Assistive device utilized: None Level of assistance: Complete Independence Comments: trunk flexed, decreased step length, decreased gait speed  10/24/21 Treatment: Therapeutic Exercise: Clam sidelying 15 x 2  Bridge with red band 15 x 2 SLR 15 x 2 each Sit-stand x 15 SLS trials  Standing red band hip abduction and extension per HEP for review Row blue band 20 x 2   10/12/21 Treatment:  Therapeutic Exercise:  Nustep L4 UE/LE x 5 minutes  SLS on foam with 3 way hip x 10 each bilat  Blue rocker board A/P weight shifting x 1 minute  Heel raises 10 x  1 with cues for slow eccentrics  LAQ 3x10 5lb  Supine SLR 2x10 3lb ea  STS 2x10, 1 x 15 with 10# KB  Supine clammshells GTB 3 x 10 bil   Bridges 3 x 10  Rows Black TB 2 x 10 Neuro Re-ED: Tandem on foam 30 sec R foot back, 30 sec L foot back  10/10/21 Treatment:  Therapeutic Exercise:  Nustep L4 UE/LE x 5 minutes  Horiz. Leg press 1 plate bilat, then with left eccentric lowering SLS on foam with 3 way hip x 10 each bilat  Blue rocker board A/P weight shifting x 1 minute  Heel raises 10 x 1 with cues for slow eccentrics LAQ 3x10 5lb Supine SLR 2x10 3lb ea STS 2x10, 1 x 15 with 15# KB Neuro Re-ED Tandem on foam 30 sec R foot back, 30 sec L foot back   10/06/21 Treatment:  Therapeutic Exercise:  Nustep L6 UE/LE x 3 minutes  Step ups bilat 1x10 4in (reduced to 2 inch due to increased pain on left ) Standing hip abd/ext 2x10 RTB ea Heel raises 10 x 2 with cues for slow eccentrics  Supine SLR 2x10 3lb ea LAQ 3x10 4lb STS 2x10, 1 x 10 with 10#  Row 3x12 blue  TB Neuro Re-ED Tandem on foam 60 sec R foot back, 30 sed L foot back     PATIENT EDUCATION:  Education details: continue HEP Person educated: Patient Education method: Explanation, Demonstration, and  reviewed Handouts Education comprehension: verbalized understanding, returned demonstration, and verbal cues required     HOME EXERCISE PROGRAM: Access Code: [email or web address] URL: [email or web address] Date: 10/24/2021 Prepared by: JHessie Diener Exercises Active Straight Leg Raise with Quad Set - 1 x daily - 7 x weekly - 2 sets - 10 reps Standing Tandem Balance with Counter Support - 1 x daily - 7 x weekly - 3 reps - 30 seconds hold Standing Shoulder Row with Anchored Resistance - 1 x daily - 7 x weekly - 3 sets - 12 reps Standing Hip Abduction with Resistance at Ankles and Counter Support - 1 x daily - 7 x weekly - 3 sets - 10 reps Standing Hip Extension with Resistance at Ankles and Counter Support - 1 x  daily - 7 x weekly - 3 sets - 10 reps Beginner Clam - 1 x daily - 7 x weekly - 2 sets - 10 reps Supine Bridge with Resistance Band - 1 x daily - 7 x weekly - 3 sets - 10 reps Standing Single Leg Stance with Counter Support - 1 x daily - 7 x weekly - 1 sets - 3 reps - 30 hold     ASSESSMENT:   CLINICAL IMPRESSION: Pt reports significant improvement in pain in left knee and she feels stronger.  She reports improved balance and demonstrates improved objective measures ie. SLS time, STS time and LE MMT. She has met or partially met all LTGS. FOTO prediction achieved. She still has pain using Left LE on stairs. She will be out of town x 1 month and has reached her current POC. Will discharge to HEP for now and see how she does for 1 month. She will request new referral to PT if she needs future visits.     REHAB POTENTIAL: Good   CLINICAL DECISION MAKING: Stable/uncomplicated   EVALUATION COMPLEXITY: Low     GOALS:   SHORT TERM GOALS: Updated 10/24/2021   STG Name Target Date Goal status  1 Pt will be compliant and knowledgeable with 90% of HEP for improved comfort and carryover Baseline: initial HEP given 09/19/2021 MET 09/21/21  2 Pt will be able to hold tandem stance for 30" with no UE support in order to improve static balance with narrow BoS Baseline: unable 09/19/2021 MET 09/21/21    LONG TERM GOALS:    LTG Name Target Date Goal status  1 Pt will improve LE MMT to no less than 4/5 for all tested motions in order to improve functional mobility and balance Baseline: see objective: status:10/24/21: met except R hip abduct 10/24/2021 Partially Met 10/24/21  2 Pt will improve reps in 30"STS to no less than 12 in order to improve balance, LE strength, and decrease fall risk with no inc in L knee pain Baseline: 10 reps with increased L knee pain; :10/24/21: 13 reps -no knee pain 10/24/2021 Met  10/24/21  3 Pt will improve SLS time to no less than 30 seconds for improved balance and stability Baseline:  R - 5 sec; L - 3 sec 10/24/21 R-13 sec , L 32 sec 10/24/2021 Partially Met 10/24/21    PLAN: PT FREQUENCY: 2x/week   PT DURATION: 8 weeks   PLANNED INTERVENTIONS: Therapeutic exercises, Therapeutic activity, Neuro Muscular re-education, Balance training, Gait training, Patient/Family education, Joint mobilization, and Manual therapy   PLAN FOR NEXT SESSION: discharge to HEP today.   Hessie Diener, PTA 10/24/21 11:08 AM Phone: [phone] Fax: [phone]   PHYSICAL THERAPY DISCHARGE SUMMARY  Visits from Start of Care: 9  Current functional level related to goals / functional outcomes: See goals and objective   Remaining deficits: See goals, objective, and assessment   Education / Equipment: HEP   Patient agrees to discharge. Patient goals were met. Patient is being discharged due to being pleased with the current functional level.

## 2021-11-22 NOTE — Progress Notes (Unsigned)
Office Visit Note  Patient: Megan Porter             Date of Birth: [birth year]           MRN: [id number]             PCP: Haywood Pao, MD Referring: Haywood Pao, MD Visit Date: 12/06/2021 Occupation: '@GUAROCC'$ @  Subjective:  No chief complaint on file.   History of Present Illness: Megan Porter is a 81 y.o. female ***   Activities of Daily Living:  Patient reports morning stiffness for *** {minute/hour:19697}.   Patient {ACTIONS;DENIES/REPORTS:21021675::"Denies"} nocturnal pain.  Difficulty dressing/grooming: {ACTIONS;DENIES/REPORTS:21021675::"Denies"} Difficulty climbing stairs: {ACTIONS;DENIES/REPORTS:21021675::"Denies"} Difficulty getting out of chair: {ACTIONS;DENIES/REPORTS:21021675::"Denies"} Difficulty using hands for taps, buttons, cutlery, and/or writing: {ACTIONS;DENIES/REPORTS:21021675::"Denies"}  No Rheumatology ROS completed.   PMFS History:  Patient Active Problem List   Diagnosis Date Noted   Abnormal CT scan of lung    Hypokalemia 09/21/2018   Benign essential HTN 09/21/2018   Leucocytosis 09/21/2018   Pneumonia 09/21/2018   Bronchiectasis without complication ([place]) 40/98/1191   Primary osteoarthritis of both knees 04/06/2017   DDD (degenerative disc disease), lumbar history of lumbar surgery 1970 04/06/2017   Age-related osteoporosis without current pathological fracture 04/06/2017   History of anxiety 04/06/2017   History of breast cancer 04/04/2017   Primary osteoarthritis of both feet 10/02/2016   Primary osteoarthritis of both hands 10/02/2016   High risk medications (not anticoagulants) long-term use 10/02/2016   Rheumatoid arthritis, seronegative, multiple sites ([place]) 10/02/2016    Past Medical History:  Diagnosis Date   Arthritis    Cancer ([place])    Osteoarthritis     Family History  Family history unknown: Yes   Past Surgical History:  Procedure Laterality Date   BACK SURGERY     BREAST RECONSTRUCTION     knee  arthroscopic     VIDEO BRONCHOSCOPY Bilateral 07/06/2019   Procedure: VIDEO BRONCHOSCOPY WITHOUT FLUORO;  Surgeon: Marshell Garfinkel, MD;  Location: [facility];  Service: Cardiopulmonary;  Laterality: Bilateral;   Social History   Social History Narrative   Not on file   Immunization History  Administered Date(s) Administered   Fluad Quad(high Dose 65+) 05/29/2019   Influenza-Unspecified 07/06/2017, 07/24/2018   PFIZER(Purple Top)SARS-COV-2 Vaccination 10/07/2019, 10/28/2019, 06/28/2020, 06/05/2021   Pneumococcal Conjugate-13 04/13/2014   Pneumococcal Polysaccharide-23 10/13/2018     Objective: Vital Signs: There were no vitals taken for this visit.   Physical Exam   Musculoskeletal Exam: ***  CDAI Exam: CDAI Score: -- Patient Global: --; Provider Global: -- Swollen: --; Tender: -- Joint Exam 12/06/2021   No joint exam has been documented for this visit   There is currently no information documented on the homunculus. Go to the Rheumatology activity and complete the homunculus joint exam.  Investigation: No additional findings.  Imaging: No results found.  Recent Labs: Lab Results  Component Value Date   WBC 5.2 06/06/2021   HGB 13.6 06/06/2021   PLT 175 06/06/2021   NA 140 06/06/2021   K 4.8 06/06/2021   CL 102 06/06/2021   CO2 31 06/06/2021   GLUCOSE 79 06/06/2021   BUN 14 06/06/2021   CREATININE 0.77 06/06/2021   BILITOT 0.5 06/06/2021   ALKPHOS 70 09/25/2018   AST 20 06/06/2021   ALT 12 06/06/2021   PROT 7.0 06/06/2021   ALBUMIN 1.8 (L) 09/25/2018   CALCIUM 9.4 06/06/2021   GFRAA 78 01/04/2021    Speciality Comments: PLQ Eye Exam 04/26/2021 WNL  @  [facility] Follow up in 6 months.    Procedures:  No procedures performed Allergies: Patient has no known allergies.   Assessment / Plan:     Visit Diagnoses: Rheumatoid arthritis, seronegative, multiple sites ([place])  High risk medications (not anticoagulants) long-term  use  Primary osteoarthritis of both hands  S/P left knee arthroscopy  Primary osteoarthritis of both hips  Trochanteric bursitis of both hips  Primary osteoarthritis of both knees  Primary osteoarthritis of both feet  DDD (degenerative disc disease), lumbar history of lumbar surgery 1970  Age-related osteoporosis without current pathological fracture  Other closed fracture of distal end of right radius with routine healing, subsequent encounter  History of anxiety  History of breast cancer  Poor balance  Orders: No orders of the defined types were placed in this encounter.  No orders of the defined types were placed in this encounter.   Face-to-face time spent with patient was *** minutes. Greater than 50% of time was spent in counseling and coordination of care.  Follow-Up Instructions: No follow-ups on file.   Ofilia Neas, PA-C  Note - This record has been created using Dragon software.  Chart creation errors have been sought, but may not always  have been located. Such creation errors do not reflect on  the standard of medical care.

## 2021-12-05 ENCOUNTER — Ambulatory Visit: Payer: Medicare Other | Admitting: Physician Assistant

## 2021-12-06 ENCOUNTER — Other Ambulatory Visit: Payer: Self-pay | Admitting: Physician Assistant

## 2021-12-06 ENCOUNTER — Encounter: Payer: Self-pay | Admitting: Physician Assistant

## 2021-12-06 ENCOUNTER — Other Ambulatory Visit: Payer: Self-pay

## 2021-12-06 ENCOUNTER — Ambulatory Visit: Payer: Medicare Other | Admitting: Physician Assistant

## 2021-12-06 VITALS — BP 158/79 | HR 73 | Ht 63.0 in | Wt 127.6 lb

## 2021-12-06 DIAGNOSIS — Z79899 Other long term (current) drug therapy: Secondary | ICD-10-CM | POA: Diagnosis not present

## 2021-12-06 DIAGNOSIS — M160 Bilateral primary osteoarthritis of hip: Secondary | ICD-10-CM

## 2021-12-06 DIAGNOSIS — M7062 Trochanteric bursitis, left hip: Secondary | ICD-10-CM

## 2021-12-06 DIAGNOSIS — M5136 Other intervertebral disc degeneration, lumbar region: Secondary | ICD-10-CM

## 2021-12-06 DIAGNOSIS — M19071 Primary osteoarthritis, right ankle and foot: Secondary | ICD-10-CM

## 2021-12-06 DIAGNOSIS — M19041 Primary osteoarthritis, right hand: Secondary | ICD-10-CM

## 2021-12-06 DIAGNOSIS — M0609 Rheumatoid arthritis without rheumatoid factor, multiple sites: Secondary | ICD-10-CM | POA: Diagnosis not present

## 2021-12-06 DIAGNOSIS — M7061 Trochanteric bursitis, right hip: Secondary | ICD-10-CM

## 2021-12-06 DIAGNOSIS — Z9889 Other specified postprocedural states: Secondary | ICD-10-CM

## 2021-12-06 DIAGNOSIS — M810 Age-related osteoporosis without current pathological fracture: Secondary | ICD-10-CM

## 2021-12-06 DIAGNOSIS — R2689 Other abnormalities of gait and mobility: Secondary | ICD-10-CM

## 2021-12-06 DIAGNOSIS — M170 Bilateral primary osteoarthritis of knee: Secondary | ICD-10-CM

## 2021-12-06 DIAGNOSIS — Z8659 Personal history of other mental and behavioral disorders: Secondary | ICD-10-CM

## 2021-12-06 DIAGNOSIS — Z853 Personal history of malignant neoplasm of breast: Secondary | ICD-10-CM

## 2021-12-06 DIAGNOSIS — R29898 Other symptoms and signs involving the musculoskeletal system: Secondary | ICD-10-CM

## 2021-12-06 DIAGNOSIS — M19042 Primary osteoarthritis, left hand: Secondary | ICD-10-CM

## 2021-12-06 DIAGNOSIS — M19072 Primary osteoarthritis, left ankle and foot: Secondary | ICD-10-CM

## 2021-12-06 DIAGNOSIS — S52591D Other fractures of lower end of right radius, subsequent encounter for closed fracture with routine healing: Secondary | ICD-10-CM

## 2021-12-06 NOTE — Patient Instructions (Signed)
Standing Labs ?We placed an order today for your standing lab work.  ? ?Please have your standing labs drawn at end of June/early July  ? ?If possible, please have your labs drawn 2 weeks prior to your appointment so that the provider can discuss your results at your appointment. ? ?Please note that you may see your imaging and lab results in [facility] before we have reviewed them. ?We may be awaiting multiple results to interpret others before contacting you. ?Please allow our office up to 72 hours to thoroughly review all of the results before contacting the office for clarification of your results. ? ?We have open lab daily: ?Monday through Thursday from 1:30-4:30 PM and Friday from 1:30-4:00 PM ?at the office of Dr. Bo Merino, [facility].   ?Please be advised, all patients with office appointments requiring lab work will take precedent over walk-in lab work.  ?If possible, please come for your lab work on Monday and Friday afternoons, as you may experience shorter wait times. ?The office is located at [address] [place], [place], [place] [address] ?No appointment is necessary.   ?Labs are drawn by [facility]. Please bring your co-pay at the time of your lab draw.  You may receive a bill from [facility] for your lab work. ? ?Please note if you are on Hydroxychloroquine and and an order has been placed for a Hydroxychloroquine level, you will need to have it drawn 4 hours or more after your last dose. ? ?If you wish to have your labs drawn at another location, please call the office 24 hours in advance to send orders. ? ?If you have any questions regarding directions or hours of operation,  ?please call [phone].   ?As a reminder, please drink plenty of water prior to coming for your lab work. Thanks! ? ?

## 2021-12-07 NOTE — Telephone Encounter (Signed)
Next Visit: 05/10/2022 ? ?Last Visit: 12/06/2021 ? ?Labs: CBC and CMP updated on 10/16/21.  Results were reviewed today in the office.  ? ?Eye exam: 04/26/2021 WNL  ? ?Current Dose per office note 12/06/2021: Plaquenil 200 mg 1 tablet by mouth daily. ? ?ZY:[id number] arthritis, seronegative, multiple sites  ? ?Last Fill: 09/13/2021 ? ?Okay to refill Plaquenil?  ?

## 2021-12-28 ENCOUNTER — Encounter: Payer: Self-pay | Admitting: Rheumatology

## 2022-03-06 ENCOUNTER — Other Ambulatory Visit: Payer: Self-pay | Admitting: Physician Assistant

## 2022-03-06 DIAGNOSIS — M0609 Rheumatoid arthritis without rheumatoid factor, multiple sites: Secondary | ICD-10-CM

## 2022-03-06 DIAGNOSIS — Z79899 Other long term (current) drug therapy: Secondary | ICD-10-CM

## 2022-03-06 NOTE — Telephone Encounter (Signed)
Next Visit: 05/10/2022   Last Visit: 12/06/2021   Labs: CBC and CMP updated on 10/16/21.  Results were reviewed today in the office.    Eye exam: 04/26/2021 WNL    Current Dose per office note 12/06/2021: Plaquenil 200 mg 1 tablet by mouth daily.   VQ:[id number] arthritis, seronegative, multiple sites    Last Fill: 12/07/2021   Okay to refill Plaquenil?

## 2022-04-26 NOTE — Progress Notes (Signed)
Office Visit Note  Patient: Megan Porter             Date of Birth: 10-25-1940           MRN: [id number]             PCP: Haywood Pao, MD Referring: Haywood Pao, MD Visit Date: 05/10/2022 Occupation: '@GUAROCC'$ @  Subjective:  Patient management  History of Present Illness: Megan Porter is a 81 y.o. female with history of rheumatoid arthritis, osteoarthritis and osteoporosis.  She states that she continues to have some discomfort in her hands, her knee joints in her feet.  She had recent viscosupplement injections at [facility].  The pain in her trochanteric bursitis has resolved.  She has not noticed any joint swelling.  She has been taking hydroxychloroquine 200 mg 1 tablet p.o. daily.  She has been off Fosamax for many years.  She will discuss repeat DEXA scan with her PCP.  She has been experiencing increased fatigue.  Activities of Daily Living:  Patient reports morning stiffness for 1 hour.   Patient Denies nocturnal pain.  Difficulty dressing/grooming: Denies Difficulty climbing stairs: Reports Difficulty getting out of chair: Reports Difficulty using hands for taps, buttons, cutlery, and/or writing: Denies  Review of Systems  Constitutional:  Negative for fatigue.  HENT:  Positive for mouth dryness. Negative for mouth sores.   Eyes:  Negative for dryness.  Respiratory:  Negative for shortness of breath.   Cardiovascular:  Negative for chest pain and palpitations.  Gastrointestinal:  Negative for blood in stool, constipation and diarrhea.  Endocrine: Negative for increased urination.  Genitourinary:  Negative for involuntary urination.  Musculoskeletal:  Positive for joint pain, joint pain and morning stiffness. Negative for myalgias and myalgias.  Skin:  Negative for color change, rash and sensitivity to sunlight.  Allergic/Immunologic: Negative for susceptible to infections.  Neurological:  Negative for dizziness.  Hematological:  Negative for swollen  glands.  Psychiatric/Behavioral:  Positive for sleep disturbance. Negative for depressed mood. The patient is not nervous/anxious.     PMFS History:  Patient Active Problem List   Diagnosis Date Noted   Abnormal CT scan of lung    Hypokalemia 09/21/2018   Benign essential HTN 09/21/2018   Leucocytosis 09/21/2018   Pneumonia 09/21/2018   Bronchiectasis without complication ([place]) 06/23/1218   Primary osteoarthritis of both knees 04/06/2017   DDD (degenerative disc disease), lumbar history of lumbar surgery 1970 04/06/2017   Age-related osteoporosis without current pathological fracture 04/06/2017   History of anxiety 04/06/2017   History of breast cancer 04/04/2017   Primary osteoarthritis of both feet 10/02/2016   Primary osteoarthritis of both hands 10/02/2016   High risk medications (not anticoagulants) long-term use 10/02/2016   Rheumatoid arthritis, seronegative, multiple sites ([place]) 10/02/2016    Past Medical History:  Diagnosis Date   Arthritis    Cancer ([place])    Osteoarthritis     Family History  Family history unknown: Yes   Past Surgical History:  Procedure Laterality Date   BACK SURGERY     BREAST RECONSTRUCTION     knee arthroscopic     VIDEO BRONCHOSCOPY Bilateral 07/06/2019   Procedure: VIDEO BRONCHOSCOPY WITHOUT FLUORO;  Surgeon: Marshell Garfinkel, MD;  Location: [facility];  Service: Cardiopulmonary;  Laterality: Bilateral;   Social History   Social History Narrative   Not on file   Immunization History  Administered Date(s) Administered   Fluad Quad(high Dose 65+) 05/29/2019   Influenza-Unspecified 07/06/2017, 07/24/2018  PFIZER(Purple Top)SARS-COV-2 Vaccination 10/07/2019, 10/28/2019, 06/28/2020, 06/05/2021   Pneumococcal Conjugate-13 04/13/2014   Pneumococcal Polysaccharide-23 10/13/2018     Objective: Vital Signs: BP (!) 157/90 (BP Location: Left Arm, Patient Position: Sitting, Cuff Size: Normal)   Pulse 65   Resp 14   Ht '5\' 3"'$  (1.6 m)    Wt 126 lb 6.4 oz (57.3 kg)   BMI 22.39 kg/m    Physical Exam Vitals and nursing note reviewed.  Constitutional:      Appearance: She is well-developed.  HENT:     Head: Normocephalic and atraumatic.  Eyes:     Conjunctiva/sclera: Conjunctivae normal.  Cardiovascular:     Rate and Rhythm: Normal rate and regular rhythm.     Heart sounds: Normal heart sounds.  Pulmonary:     Effort: Pulmonary effort is normal.     Breath sounds: Normal breath sounds.  Abdominal:     General: Bowel sounds are normal.     Palpations: Abdomen is soft.  Musculoskeletal:     Cervical back: Normal range of motion.  Lymphadenopathy:     Cervical: No cervical adenopathy.  Skin:    General: Skin is warm and dry.     Capillary Refill: Capillary refill takes less than 2 seconds.  Neurological:     Mental Status: She is alert and oriented to person, place, and time.  Psychiatric:        Behavior: Behavior normal.      Musculoskeletal Exam: C-spine was in good range of motion.  She had limited painful range of motion lumbar spine.  Shoulder joints, elbow joints, wrist joints with good range of motion.  She had bilateral MCP thickening but no synovitis.  Bilateral PIP and DIP thickening was noted.  She had limited range of motion of the right hip.  The left joint was in good range of motion.  Knee joints with good range of motion with some discomfort.  Ankles with good range of motion.  She had bilateral first MTP thickening and PIP and DIP thickening with no synovitis.  CDAI Exam: CDAI Score: 0.4  Patient Global: 2 mm; Provider Global: 2 mm Swollen: 0 ; Tender: 0  Joint Exam 05/10/2022   No joint exam has been documented for this visit   There is currently no information documented on the homunculus. Go to the Rheumatology activity and complete the homunculus joint exam.  Investigation: No additional findings.  Imaging: No results found.  Recent Labs: Lab Results  Component Value Date   WBC  5.2 06/06/2021   HGB 13.6 06/06/2021   PLT 175 06/06/2021   NA 140 06/06/2021   K 4.8 06/06/2021   CL 102 06/06/2021   CO2 31 06/06/2021   GLUCOSE 79 06/06/2021   BUN 14 06/06/2021   CREATININE 0.77 06/06/2021   BILITOT 0.5 06/06/2021   ALKPHOS 70 09/25/2018   AST 20 06/06/2021   ALT 12 06/06/2021   PROT 7.0 06/06/2021   ALBUMIN 1.8 (L) 09/25/2018   CALCIUM 9.4 06/06/2021   GFRAA 78 01/04/2021    Speciality Comments: PLQ Eye Exam 12/28/2021 WNL  @ [facility] Follow up in 6 months.    Procedures:  No procedures performed Allergies: Patient has no known allergies.   Assessment / Plan:     Visit Diagnoses: Rheumatoid arthritis, seronegative, multiple sites ([place]) - Erosive disease, neg RF,CCP, and ANA: She is doing well on hydroxychloroquine 200 mg p.o. daily without any synovitis.  She continues to have some joint pain and joint  stiffness due to underlying osteoarthritis.  High risk medications (not anticoagulants) long-term use - Plaquenil 200 mg 1 tablet by mouth daily. D/c MTX in April 2020 due to recurrent infections. PLQ Eye Exam 12/28/2021.  She has not had labs since September 2022.  We will get labs today.  She was advised to get labs every 5 months.- Plan: CBC with Differential/Platelet, COMPLETE METABOLIC PANEL WITH GFR.  In probationary immunization was placed in the AVS.  Primary osteoarthritis of both hands-she has severe osteoarthritis in bilateral hands.  Joint protection muscle strengthening was discussed.  S/P left knee arthroscopy - Performed by Dr. Wynelle Link.   Primary osteoarthritis of both hips-she had better range of motion of her right hip joint.  Left hip joint was in good range of motion.  Primary osteoarthritis of both knees-she is discomfort in her knee joints.  She had recent viscosupplement injections to [facility].  Primary osteoarthritis of both feet-she is bilateral first MTP thickening.  Proper fitting shoes were emphasized.  DDD  (degenerative disc disease), lumbar history of lumbar surgery 1970.  She had limited range of motion of her lumbar spine with some discomfort.  Age-related osteoporosis without current pathological fracture - DEXA ordered by PCP.  Records not in epic.  She has been off of Fosamax.  Patient is uncertain why she stopped Fosamax.  I advised her to get repeat DEXA scan through her PCP.  Other closed fracture of distal end of right radius with routine healing, subsequent encounter - evaluated by Dr. Jeannie Fend in February 2022.    Poor balance-lower extremity muscle strengthening exercises were discussed.  Muscular deconditioning  History of breast cancer  History of anxiety  Orders: Orders Placed This Encounter  Procedures   CBC with Differential/Platelet   COMPLETE METABOLIC PANEL WITH GFR   No orders of the defined types were placed in this encounter.  .  Follow-Up Instructions: Return in about 5 months (around 10/10/2022) for Rheumatoid arthritis, Osteoarthritis, Osteoporosis.   Bo Merino, MD  Note - This record has been created using Editor, commissioning.  Chart creation errors have been sought, but may not always  have been located. Such creation errors do not reflect on  the standard of medical care.

## 2022-05-10 ENCOUNTER — Encounter: Payer: Self-pay | Admitting: Rheumatology

## 2022-05-10 ENCOUNTER — Ambulatory Visit: Payer: Medicare Other | Attending: Rheumatology | Admitting: Rheumatology

## 2022-05-10 VITALS — BP 157/90 | HR 65 | Resp 14 | Ht 63.0 in | Wt 126.4 lb

## 2022-05-10 DIAGNOSIS — R29898 Other symptoms and signs involving the musculoskeletal system: Secondary | ICD-10-CM

## 2022-05-10 DIAGNOSIS — M7061 Trochanteric bursitis, right hip: Secondary | ICD-10-CM

## 2022-05-10 DIAGNOSIS — R2689 Other abnormalities of gait and mobility: Secondary | ICD-10-CM

## 2022-05-10 DIAGNOSIS — M5136 Other intervertebral disc degeneration, lumbar region: Secondary | ICD-10-CM

## 2022-05-10 DIAGNOSIS — Z79899 Other long term (current) drug therapy: Secondary | ICD-10-CM | POA: Diagnosis not present

## 2022-05-10 DIAGNOSIS — M160 Bilateral primary osteoarthritis of hip: Secondary | ICD-10-CM

## 2022-05-10 DIAGNOSIS — S52591D Other fractures of lower end of right radius, subsequent encounter for closed fracture with routine healing: Secondary | ICD-10-CM

## 2022-05-10 DIAGNOSIS — M0609 Rheumatoid arthritis without rheumatoid factor, multiple sites: Secondary | ICD-10-CM

## 2022-05-10 DIAGNOSIS — M19041 Primary osteoarthritis, right hand: Secondary | ICD-10-CM | POA: Diagnosis not present

## 2022-05-10 DIAGNOSIS — M810 Age-related osteoporosis without current pathological fracture: Secondary | ICD-10-CM

## 2022-05-10 DIAGNOSIS — Z9889 Other specified postprocedural states: Secondary | ICD-10-CM

## 2022-05-10 DIAGNOSIS — Z853 Personal history of malignant neoplasm of breast: Secondary | ICD-10-CM

## 2022-05-10 DIAGNOSIS — M19042 Primary osteoarthritis, left hand: Secondary | ICD-10-CM

## 2022-05-10 DIAGNOSIS — M19071 Primary osteoarthritis, right ankle and foot: Secondary | ICD-10-CM

## 2022-05-10 DIAGNOSIS — M19072 Primary osteoarthritis, left ankle and foot: Secondary | ICD-10-CM

## 2022-05-10 DIAGNOSIS — M170 Bilateral primary osteoarthritis of knee: Secondary | ICD-10-CM

## 2022-05-10 DIAGNOSIS — Z8659 Personal history of other mental and behavioral disorders: Secondary | ICD-10-CM

## 2022-05-10 NOTE — Patient Instructions (Signed)
Standing Labs We placed an order today for your standing lab work.   Please have your standing labs drawn in January  If possible, please have your labs drawn 2 weeks prior to your appointment so that the provider can discuss your results at your appointment.  Please note that you may see your imaging and lab results in [facility] before we have reviewed them. We may be awaiting multiple results to interpret others before contacting you. Please allow our office up to 72 hours to thoroughly review all of the results before contacting the office for clarification of your results.  We currently have open lab daily: Monday through Thursday from 1:30 PM-4:30 PM and Friday from 1:30 PM- 4:00 PM If possible, please come for your lab work on Monday, Thursday or Friday afternoons, as you may experience shorter wait times.   Effective July 18, 2022 the new lab hours will change to: Monday through Thursday from 1:30 PM-5:00 PM and Friday from 8:30 AM-12:00 PM If possible, please come for your lab work on Monday and Thursday afternoons, as you may experience shorter wait times.  Please be advised, all patients with office appointments requiring lab work will take precedent over walk-in lab work.    The office is located at [address], [place], [place], [place] [address] No appointment is necessary.   Labs are drawn by [facility]. Please bring your co-pay at the time of your lab draw.  You may receive a bill from [facility] for your lab work.  Please note if you are on Hydroxychloroquine and and an order has been placed for a Hydroxychloroquine level, you will need to have it drawn 4 hours or more after your last dose.  If you wish to have your labs drawn at another location, please call the office 24 hours in advance to send orders.  Vaccines You are taking a medication(s) that can suppress your immune system.  The following immunizations are recommended: Flu annually Covid-19  Td/Tdap (tetanus,  diphtheria, pertussis) every 10 years Pneumonia (Prevnar 15 then Pneumovax 23 at least 1 year apart.  Alternatively, can take Prevnar 20 without needing additional dose) Shingrix: 2 doses from 4 weeks to 6 months apart  Please check with your PCP to make sure you are up to date.   If you have any questions regarding directions or hours of operation,  please call [phone].   As a reminder, please drink plenty of water prior to coming for your lab work. Thanks!

## 2022-05-11 LAB — CBC WITH DIFFERENTIAL/PLATELET
Absolute Monocytes: 371 cells/uL (ref 200–950)
Basophils Absolute: 52 cells/uL (ref 0–200)
Basophils Relative: 0.9 %
Eosinophils Absolute: 122 cells/uL (ref 15–500)
Eosinophils Relative: 2.1 %
HCT: 41.7 % (ref 35.0–45.0)
Hemoglobin: 13.9 g/dL (ref 11.7–15.5)
Lymphs Abs: 1021 cells/uL (ref 850–3900)
MCH: 33.5 pg — ABNORMAL HIGH (ref 27.0–33.0)
MCHC: 33.3 g/dL (ref 32.0–36.0)
MCV: 100.5 fL — ABNORMAL HIGH (ref 80.0–100.0)
MPV: 10.5 fL (ref 7.5–12.5)
Monocytes Relative: 6.4 %
Neutro Abs: 4234 cells/uL (ref 1500–7800)
Neutrophils Relative %: 73 %
Platelets: 204 10*3/uL (ref 140–400)
RBC: 4.15 10*6/uL (ref 3.80–5.10)
RDW: 12.1 % (ref 11.0–15.0)
Total Lymphocyte: 17.6 %
WBC: 5.8 10*3/uL (ref 3.8–10.8)

## 2022-05-11 LAB — COMPLETE METABOLIC PANEL WITH GFR
AG Ratio: 1.8 (calc) (ref 1.0–2.5)
ALT: 15 U/L (ref 6–29)
AST: 18 U/L (ref 10–35)
Albumin: 4.4 g/dL (ref 3.6–5.1)
Alkaline phosphatase (APISO): 77 U/L (ref 37–153)
BUN: 14 mg/dL (ref 7–25)
CO2: 27 mmol/L (ref 20–32)
Calcium: 9.2 mg/dL (ref 8.6–10.4)
Chloride: 104 mmol/L (ref 98–110)
Creat: 0.78 mg/dL (ref 0.60–0.95)
Globulin: 2.5 g/dL (calc) (ref 1.9–3.7)
Glucose, Bld: 82 mg/dL (ref 65–99)
Potassium: 4.5 mmol/L (ref 3.5–5.3)
Sodium: 141 mmol/L (ref 135–146)
Total Bilirubin: 0.5 mg/dL (ref 0.2–1.2)
Total Protein: 6.9 g/dL (ref 6.1–8.1)
eGFR: 75 mL/min/{1.73_m2} (ref >=60)

## 2022-05-11 NOTE — Progress Notes (Signed)
CBC and CMP are stable.

## 2022-05-23 ENCOUNTER — Other Ambulatory Visit: Payer: Self-pay | Admitting: Physician Assistant

## 2022-05-23 DIAGNOSIS — Z79899 Other long term (current) drug therapy: Secondary | ICD-10-CM

## 2022-05-23 DIAGNOSIS — M0609 Rheumatoid arthritis without rheumatoid factor, multiple sites: Secondary | ICD-10-CM

## 2022-05-23 NOTE — Telephone Encounter (Signed)
Next Visit: 10/10/2022  Last Visit: 05/10/2022  Labs: 05/10/2022 CBC and CMP are stable.  Eye exam: 12/28/2021 WNL     Current Dose per office note 05/10/2022: Plaquenil 200 mg 1 tablet by mouth daily  DX: Rheumatoid arthritis, seronegative, multiple sites   Last Fill: 03/06/2022  Okay to refill Plaquenil?

## 2022-06-14 ENCOUNTER — Other Ambulatory Visit (HOSPITAL_COMMUNITY): Payer: Self-pay

## 2022-06-20 ENCOUNTER — Other Ambulatory Visit (HOSPITAL_COMMUNITY): Payer: Self-pay

## 2022-06-21 ENCOUNTER — Ambulatory Visit (HOSPITAL_COMMUNITY)
Admission: RE | Admit: 2022-06-21 | Discharge: 2022-06-21 | Disposition: A | Discharge status: Home / Self Care | Payer: Medicare Other | Source: Ambulatory Visit | Attending: Internal Medicine | Admitting: Internal Medicine

## 2022-06-21 DIAGNOSIS — M810 Age-related osteoporosis without current pathological fracture: Secondary | ICD-10-CM | POA: Diagnosis not present

## 2022-06-21 MED ORDER — DENOSUMAB 60 MG/ML ~~LOC~~ SOSY
PREFILLED_SYRINGE | SUBCUTANEOUS | Status: AC
  Filled 2022-06-21: qty 1

## 2022-06-21 MED ORDER — DENOSUMAB 60 MG/ML ~~LOC~~ SOSY
60.0000 mg | PREFILLED_SYRINGE | Freq: Once | SUBCUTANEOUS | Status: AC
  Administered 2022-06-21: 60 mg via SUBCUTANEOUS

## 2022-08-16 ENCOUNTER — Other Ambulatory Visit: Payer: Self-pay | Admitting: Rheumatology

## 2022-08-16 DIAGNOSIS — Z79899 Other long term (current) drug therapy: Secondary | ICD-10-CM

## 2022-08-16 DIAGNOSIS — M0609 Rheumatoid arthritis without rheumatoid factor, multiple sites: Secondary | ICD-10-CM

## 2022-08-16 NOTE — Telephone Encounter (Signed)
Next Visit: 10/10/2022   Last Visit: 05/10/2022   Labs: 05/10/2022 CBC and CMP are stable.   Eye exam: 12/28/2021 WNL      Current Dose per office note 05/10/2022: Plaquenil 200 mg 1 tablet by mouth daily   DX: Rheumatoid arthritis, seronegative, multiple sites    Last Fill: 05/23/2022   Okay to refill Plaquenil?

## 2022-09-27 NOTE — Progress Notes (Unsigned)
Office Visit Note  Patient: Megan Porter             Date of Birth: Sep 11, 1941           MRN: [id number]             PCP: Haywood Pao, MD Referring: Haywood Pao, MD Visit Date: 10/10/2022 Occupation: '@GUAROCC'$ @  Subjective:  Fatigue   History of Present Illness: Megan Porter is a 81 y.o. female with history of seronegative rheumatoid arthritis, osteoarthritis, and DDD.  She is taking plaquenil 200 mg 1 tablet by mouth daily.  She is tolerating Plaquenil without any side effects and has not missed any doses recently.  She denies any signs or symptoms of a rheumatoid arthritis flare.  She is tolerating Plaquenil without any side effects and has not missed any doses recently.  She denies any signs or symptoms of a rheumatoid arthritis flare. She has intermittent pain and stiffness in the left knee joint after having arthroscopic surgery in the left knee about 2 years ago.  [facility] 3-4 months for a cortisone injection which alleviates her symptoms.  She continues to notice crepitus intermittently.  She denies any right knee joint pain.   Patient reports she was recently diagnosed with pneumonia.  She underwent outpatient treatment and had an updated CXR which was clear.  She continues to have a residual cough but overall her symptoms continue to improve.     Activities of Daily Living:  Patient reports morning stiffness for 1 hour.   Patient Denies nocturnal pain.  Difficulty dressing/grooming: Denies Difficulty climbing stairs: Reports Difficulty getting out of chair: Denies Difficulty using hands for taps, buttons, cutlery, and/or writing: Denies  Review of Systems  Constitutional:  Positive for fatigue.  HENT:  Negative for mouth sores and mouth dryness.   Eyes:  Negative for dryness.  Respiratory:  Positive for shortness of breath.   Cardiovascular:  Negative for chest pain and palpitations.  Gastrointestinal:  Positive for constipation.  Negative for blood in stool and diarrhea.  Endocrine: Negative for increased urination.  Genitourinary:  Negative for involuntary urination.  Musculoskeletal:  Positive for gait problem and morning stiffness. Negative for joint pain, joint pain, joint swelling, myalgias, muscle weakness, muscle tenderness and myalgias.  Skin:  Negative for color change, rash, hair loss and sensitivity to sunlight.  Allergic/Immunologic: Negative for susceptible to infections.  Neurological:  Negative for dizziness and headaches.  Hematological:  Negative for swollen glands.  Psychiatric/Behavioral:  Negative for depressed mood and sleep disturbance. The patient is not nervous/anxious.     PMFS History:  Patient Active Problem List   Diagnosis Date Noted   Abnormal CT scan of lung    Hypokalemia 09/21/2018   Benign essential HTN 09/21/2018   Leucocytosis 09/21/2018   Pneumonia 09/21/2018   Bronchiectasis without complication ([place]) 17/51/0258   Primary osteoarthritis of both knees 04/06/2017   DDD (degenerative disc disease), lumbar history of lumbar surgery 1970 04/06/2017   Age-related osteoporosis without current pathological fracture 04/06/2017   History of anxiety 04/06/2017   History of breast cancer 04/04/2017   Primary osteoarthritis of both feet 10/02/2016   Primary osteoarthritis of both hands 10/02/2016   High risk medications (not anticoagulants) long-term use 10/02/2016   Rheumatoid arthritis, seronegative, multiple sites ([place]) 10/02/2016    Past Medical History:  Diagnosis Date   Arthritis    Cancer ([place])    Osteoarthritis     Family  History  Family history unknown: Yes   Past Surgical History:  Procedure Laterality Date   BACK SURGERY     BREAST RECONSTRUCTION     knee arthroscopic     VIDEO BRONCHOSCOPY Bilateral 07/06/2019   Procedure: VIDEO BRONCHOSCOPY WITHOUT FLUORO;  Surgeon: Marshell Garfinkel, MD;  Location: [facility];  Service: Cardiopulmonary;  Laterality:  Bilateral;   Social History   Social History Narrative   Not on file   Immunization History  Administered Date(s) Administered   Fluad Quad(high Dose 65+) 05/29/2019   Influenza-Unspecified 07/06/2017, 07/24/2018   PFIZER(Purple Top)SARS-COV-2 Vaccination 10/07/2019, 10/28/2019, 06/28/2020, 06/05/2021   Pneumococcal Conjugate-13 04/13/2014   Pneumococcal Polysaccharide-23 10/13/2018     Objective: Vital Signs: BP 134/78 (BP Location: Left Arm, Patient Position: Sitting, Cuff Size: Normal)   Pulse 85   Resp 17   Ht '5\' 2"'$  (1.575 m)   Wt 123 lb (55.8 kg)   BMI 22.50 kg/m    Physical Exam Vitals and nursing note reviewed.  Constitutional:      Appearance: She is well-developed.  HENT:     Head: Normocephalic and atraumatic.  Eyes:     Conjunctiva/sclera: Conjunctivae normal.  Cardiovascular:     Rate and Rhythm: Normal rate and regular rhythm.     Heart sounds: Normal heart sounds.  Pulmonary:     Effort: Pulmonary effort is normal.     Breath sounds: Normal breath sounds.  Abdominal:     General: Bowel sounds are normal.     Palpations: Abdomen is soft.  Musculoskeletal:     Cervical back: Normal range of motion.  Skin:    General: Skin is warm and dry.     Capillary Refill: Capillary refill takes less than 2 seconds.  Neurological:     Mental Status: She is alert and oriented to person, place, and time.  Psychiatric:        Behavior: Behavior normal.      Musculoskeletal Exam: C-spine has good range of motion with no discomfort.  Shoulder joints, elbow joints, wrist joints, MCPs, PIPs, DIPs have good range of motion with no synovitis.  Complete fist formation bilaterally. PIP and DIP thickening consistent with osteoarthritis of both hands.  Hip joints have good ROM with no groin pain. Left knee has some fullness and discomfort with ROM.  Right knee has good ROM with no warmth or effusion.  Ankle joints have good ROM with no tenderness or joint swelling.   CDAI  Exam: CDAI Score: -- Patient Global: 1 mm; Provider Global: 1 mm Swollen: --; Tender: -- Joint Exam 10/10/2022   No joint exam has been documented for this visit   There is currently no information documented on the homunculus. Go to the Rheumatology activity and complete the homunculus joint exam.  Investigation: No additional findings.  Imaging: No results found.  Recent Labs: Lab Results  Component Value Date   WBC 5.8 05/10/2022   HGB 13.9 05/10/2022   PLT 204 05/10/2022   NA 141 05/10/2022   K 4.5 05/10/2022   CL 104 05/10/2022   CO2 27 05/10/2022   GLUCOSE 82 05/10/2022   BUN 14 05/10/2022   CREATININE 0.78 05/10/2022   BILITOT 0.5 05/10/2022   ALKPHOS 70 09/25/2018   AST 18 05/10/2022   ALT 15 05/10/2022   PROT 6.9 05/10/2022   ALBUMIN 1.8 (L) 09/25/2018   CALCIUM 9.2 05/10/2022   GFRAA 78 01/04/2021    Speciality Comments: PLQ Eye Exam 12/28/2021 WNL  @ [facility]  Follow up in 6 months.    Procedures:  No procedures performed Allergies: Patient has no known allergies.    Assessment / Plan:     Visit Diagnoses: Rheumatoid arthritis, seronegative, multiple sites ([place]) - Erosive disease, neg RF,CCP, and ANA: She has no joint tenderness or synovitis on examination today.  She has not had any signs or symptoms of a rheumatoid arthritis flare.  She has clinically been doing well taking Plaquenil 200 mg 1 tablet by mouth daily.  She is tolerating Plaquenil without any side effects and has not missed any doses recently.  She experiences intermittent joint stiffness but has not had any soreness or swelling.  She will remain on Plaquenil as prescribed.  She was advised to notify us if she develops signs or symptoms of a flare.  She will follow-up in the office in 5 months or sooner if needed.  High risk medications (not anticoagulants) long-term use - Plaquenil 200 mg 1 tablet by mouth daily.  D/c MTX in April 2020 due to recurrent infections.  CBC and  CMP updated on 05/10/22. Orders for CBC and CMP released today.  PLQ Eye Exam 12/28/2021 WNL @ [facility].  Patient was given a Plaquenil eye examination form to take with her to her upcoming appointment. - Plan: COMPLETE METABOLIC PANEL WITH GFR, CBC with Differential/Platelet  Primary osteoarthritis of both hands: She has severe osteoarthritis of both hands.  CMC joint prominence and subluxation bilaterally.  PIP and DIP thickening noted.  No synovitis noted.  No tenderness upon palpation.  Complete fist formation bilaterally.  Discussed the importance of joint protection and muscle strengthening.  S/P left knee arthroscopy - Performed by Dr. Wynelle Link.  She continues to experience intermittent pain and swelling in the left knee joint.  She [facility] at [facility] every 3 to 4 months for a cortisone injection which alleviates her symptoms.  Primary osteoarthritis of both hips: Hip joints have good range of motion with no groin pain currently.  Primary osteoarthritis of both knees -She has intermittent discomfort in the left knee joint status post arthroscopic surgery for meniscal repair in the left knee about 2 years ago.  She follows up at [facility] every 3 to 4 months and has cortisone injections in the left knee joint at that time.  She had recent viscosupplement injections to [facility].  Primary osteoarthritis of both feet: She is not experiencing discomfort in her feet at this time.  DDD (degenerative disc disease), lumbar history of lumbar surgery 1970: Intermittent stiffness.    Age-related osteoporosis without current pathological fracture: DEXA ordered and followed by PCP.  No longer taking fosamax.    Other medical conditions are listed as follows:   Other closed fracture of distal end of right radius with routine healing, subsequent encounter - Evaluated by Dr. Jeannie Fend in February 2022.  Poor balance  Muscular deconditioning  History of breast  cancer  History of anxiety  Orders: Orders Placed This Encounter  Procedures   COMPLETE METABOLIC PANEL WITH GFR   CBC with Differential/Platelet   No orders of the defined types were placed in this encounter.   Follow-Up Instructions: Return in about 5 months (around 03/11/2023) for Rheumatoid arthritis, Osteoarthritis, ILD.   Megan Neas, PA-C  Note - This record has been created using Dragon software.  Chart creation errors have been sought, but may not always  have been located. Such creation errors do not reflect on  the standard of medical care.

## 2022-10-10 ENCOUNTER — Ambulatory Visit: Payer: Medicare Other | Attending: Physician Assistant | Admitting: Physician Assistant

## 2022-10-10 ENCOUNTER — Encounter: Payer: Self-pay | Admitting: Physician Assistant

## 2022-10-10 VITALS — BP 134/78 | HR 85 | Resp 17 | Ht 62.0 in | Wt 123.0 lb

## 2022-10-10 DIAGNOSIS — M170 Bilateral primary osteoarthritis of knee: Secondary | ICD-10-CM

## 2022-10-10 DIAGNOSIS — R29898 Other symptoms and signs involving the musculoskeletal system: Secondary | ICD-10-CM

## 2022-10-10 DIAGNOSIS — Z8659 Personal history of other mental and behavioral disorders: Secondary | ICD-10-CM

## 2022-10-10 DIAGNOSIS — M19041 Primary osteoarthritis, right hand: Secondary | ICD-10-CM

## 2022-10-10 DIAGNOSIS — R2689 Other abnormalities of gait and mobility: Secondary | ICD-10-CM

## 2022-10-10 DIAGNOSIS — M5136 Other intervertebral disc degeneration, lumbar region: Secondary | ICD-10-CM

## 2022-10-10 DIAGNOSIS — M810 Age-related osteoporosis without current pathological fracture: Secondary | ICD-10-CM

## 2022-10-10 DIAGNOSIS — M160 Bilateral primary osteoarthritis of hip: Secondary | ICD-10-CM

## 2022-10-10 DIAGNOSIS — Z79899 Other long term (current) drug therapy: Secondary | ICD-10-CM | POA: Diagnosis not present

## 2022-10-10 DIAGNOSIS — Z9889 Other specified postprocedural states: Secondary | ICD-10-CM

## 2022-10-10 DIAGNOSIS — M19042 Primary osteoarthritis, left hand: Secondary | ICD-10-CM

## 2022-10-10 DIAGNOSIS — M0609 Rheumatoid arthritis without rheumatoid factor, multiple sites: Secondary | ICD-10-CM

## 2022-10-10 DIAGNOSIS — M19072 Primary osteoarthritis, left ankle and foot: Secondary | ICD-10-CM

## 2022-10-10 DIAGNOSIS — Z853 Personal history of malignant neoplasm of breast: Secondary | ICD-10-CM

## 2022-10-10 DIAGNOSIS — S52591D Other fractures of lower end of right radius, subsequent encounter for closed fracture with routine healing: Secondary | ICD-10-CM

## 2022-10-10 DIAGNOSIS — M19071 Primary osteoarthritis, right ankle and foot: Secondary | ICD-10-CM

## 2022-10-11 LAB — COMPLETE METABOLIC PANEL WITH GFR
AG Ratio: 0.9 (calc) — ABNORMAL LOW (ref 1.0–2.5)
ALT: 11 U/L (ref 6–29)
AST: 15 U/L (ref 10–35)
Albumin: 3.5 g/dL — ABNORMAL LOW (ref 3.6–5.1)
Alkaline phosphatase (APISO): 72 U/L (ref 37–153)
BUN: 12 mg/dL (ref 7–25)
CO2: 27 mmol/L (ref 20–32)
Calcium: 8.4 mg/dL — ABNORMAL LOW (ref 8.6–10.4)
Chloride: 105 mmol/L (ref 98–110)
Creat: 0.68 mg/dL (ref 0.60–0.95)
Globulin: 3.7 g/dL (calc) (ref 1.9–3.7)
Glucose, Bld: 100 mg/dL — ABNORMAL HIGH (ref 65–99)
Potassium: 4.6 mmol/L (ref 3.5–5.3)
Sodium: 142 mmol/L (ref 135–146)
Total Bilirubin: 0.3 mg/dL (ref 0.2–1.2)
Total Protein: 7.2 g/dL (ref 6.1–8.1)
eGFR: 86 mL/min/{1.73_m2} (ref >=60)

## 2022-10-11 LAB — CBC WITH DIFFERENTIAL/PLATELET
Absolute Monocytes: 502 cells/uL (ref 200–950)
Basophils Absolute: 70 cells/uL (ref 0–200)
Basophils Relative: 0.8 %
Eosinophils Absolute: 150 cells/uL (ref 15–500)
Eosinophils Relative: 1.7 %
HCT: 35.4 % (ref 35.0–45.0)
Hemoglobin: 12 g/dL (ref 11.7–15.5)
Lymphs Abs: 810 cells/uL — ABNORMAL LOW (ref 850–3900)
MCH: 32.6 pg (ref 27.0–33.0)
MCHC: 33.9 g/dL (ref 32.0–36.0)
MCV: 96.2 fL (ref 80.0–100.0)
MPV: 10.2 fL (ref 7.5–12.5)
Monocytes Relative: 5.7 %
Neutro Abs: 7269 cells/uL (ref 1500–7800)
Neutrophils Relative %: 82.6 %
Platelets: 330 10*3/uL (ref 140–400)
RBC: 3.68 10*6/uL — ABNORMAL LOW (ref 3.80–5.10)
RDW: 11.6 % (ref 11.0–15.0)
Total Lymphocyte: 9.2 %
WBC: 8.8 10*3/uL (ref 3.8–10.8)

## 2022-10-11 NOTE — Progress Notes (Signed)
RBC count is borderline low.  Absolute lymphocytes are borderline low.  Rest of CBC WNL.   Calcium is low.  Please clarify if she has been taking a calcium supplement? Recommend increasing calcium intake.

## 2022-12-04 NOTE — Progress Notes (Signed)
Synopsis: Referred for chronic cough by Tisovec, Fransico Him, MD  Subjective:   PATIENT ID: Megan Porter: female DOB: 10-16-1940, MRN: [id number]  No chief complaint on file.  81yF with history of bronchiectasis, HTN, breast cancer, seronegative RA referred for chronic cough  Had bronchoscopy 2020 with negative cultures on BAL.   Coughing for a couple months. Was then treated with antibiotics, prednisone taper. There is occasionally phlegm. No fever. She does feel more dypsneic. She is not taking symbicort anymore. SHe is taking breo 100 1 puff once daily but she's not convinced it's helpful. No sinus congestion. Minor extent of postnasal drainage. No reflux.   Otherwise pertinent review of systems is negative.  No family history of lung disease  She was a Network engineer in past. Never smoker, no vaping/MJ.    Past Medical History:  Diagnosis Date   Arthritis    Cancer ([place])    Osteoarthritis      Family History  Family history unknown: Yes     Past Surgical History:  Procedure Laterality Date   BACK SURGERY     BREAST RECONSTRUCTION     knee arthroscopic     VIDEO BRONCHOSCOPY Bilateral 07/06/2019   Procedure: VIDEO BRONCHOSCOPY WITHOUT FLUORO;  Surgeon: Marshell Garfinkel, MD;  Location: [facility];  Service: Cardiopulmonary;  Laterality: Bilateral;    Social History   Socioeconomic History   Marital status: Married    Spouse name: Not on file   Number of children: Not on file   Years of education: Not on file   Highest education level: Not on file  Occupational History   Not on file  Tobacco Use   Smoking status: Never    Passive exposure: Never   Smokeless tobacco: Never  Vaping Use   Vaping Use: Never used  Substance and Sexual Activity   Alcohol use: Yes    Alcohol/week: 7.0 standard drinks of alcohol    Types: 7 Glasses of wine per week   Drug use: No   Sexual activity: Not on file  Other Topics Concern   Not on file  Social History Narrative    Not on file   Social Determinants of Health   Financial Resource Strain: Not on file  Food Insecurity: Not on file  Transportation Needs: Not on file  Physical Activity: Not on file  Stress: Not on file  Social Connections: Not on file  Intimate Partner Violence: Not on file     No Known Allergies   Outpatient Medications Prior to Visit  Medication Sig Dispense Refill   albuterol (VENTOLIN HFA) 108 (90 Base) MCG/ACT inhaler 2 puffs as needed for shortness of breath, wheezing, tightness Inhalation every 4 hrs for 30 days     Biotin 5 MG CAPS Take 5,000 mg by mouth daily.     calcium-vitamin D (OSCAL WITH D) 500-200 MG-UNIT TABS tablet Take by mouth.     Cholecalciferol (VITAMIN D) 50 MCG (2000 UT) CAPS Take 2,000 Units by mouth daily.      citalopram (CELEXA) 40 MG tablet TAKE 1 TABLET BY MOUTH  DAILY (Patient taking differently: Take 40 mg by mouth daily.) 90 tablet 1   diphenhydrAMINE-APAP, sleep, (TYLENOL PM EXTRA STRENGTH PO) Take by mouth.     hydroxychloroquine (PLAQUENIL) 200 MG tablet TAKE 1 TABLET BY MOUTH DAILY 90 tablet 3   levothyroxine (SYNTHROID) 75 MCG tablet Take 75 mcg by mouth daily.     Multiple Vitamin (MULTIVITAMIN) capsule Take 1 capsule by mouth  daily.     mupirocin ointment (BACTROBAN) 2 % SMARTSIG:1 Application Topical 2-3 Times Daily     predniSONE (STERAPRED UNI-PAK 21 TAB) 5 MG (21) TBPK tablet Take by mouth.     SODIUM FLUORIDE, DENTAL RINSE, (PREVIDENT) 0.2 % SOLN      levothyroxine (SYNTHROID, LEVOTHROID) 50 MCG tablet Take 50 mcg by mouth daily before breakfast.  (Patient not taking: Reported on 10/10/2022)     No facility-administered medications prior to visit.       Objective:   Physical Exam:  General appearance: 82 y.o., female, NAD, conversant  Eyes: anicteric sclerae; PERRL, tracking appropriately HENT: NCAT; MMM Neck: Trachea midline; no lymphadenopathy, no JVD Lungs: faint rhonchi bl, with normal respiratory effort CV: RRR, no  murmur  Abdomen: Soft, non-tender; non-distended, BS present  Extremities: No peripheral edema, warm Skin: Normal turgor and texture; no rash Psych: Appropriate affect Neuro: Alert and oriented to person and place, no focal deficit     Vitals:   12/07/22 1114  BP: (!) 154/84  Pulse: (!) 102  Temp: 98.1 F (36.7 C)  TempSrc: Oral  SpO2: 90%  Weight: 121 lb (54.9 kg)  Height: 5\' 2"  (1.575 m)   90% on RA BMI Readings from Last 3 Encounters:  12/07/22 22.13 kg/m  10/10/22 22.50 kg/m  05/10/22 22.39 kg/m   Wt Readings from Last 3 Encounters:  12/07/22 121 lb (54.9 kg)  10/10/22 123 lb (55.8 kg)  05/10/22 126 lb 6.4 oz (57.3 kg)     CBC    Component Value Date/Time   WBC 8.8 10/10/2022 1049   RBC 3.68 (L) 10/10/2022 1049   HGB 12.0 10/10/2022 1049   HCT 35.4 10/10/2022 1049   PLT 330 10/10/2022 1049   MCV 96.2 10/10/2022 1049   MCH 32.6 10/10/2022 1049   MCHC 33.9 10/10/2022 1049   RDW 11.6 10/10/2022 1049   LYMPHSABS 810 (L) 10/10/2022 1049   MONOABS 1.0 09/21/2018 2031   EOSABS 150 10/10/2022 1049   BASOSABS 70 10/10/2022 1049     Chest Imaging: HRCT Chest 2020 with nodular bronchiectasis, greatest extent RML, Lingula  Pulmonary Functions Testing Results:    Latest Ref Rng & Units 08/06/2017    1:05 PM  PFT Results  FVC-Pre L 1.80   FVC-Predicted Pre % 72   FVC-Post L 1.82   FVC-Predicted Post % 73   Pre FEV1/FVC % % 71   Post FEV1/FCV % % 73   FEV1-Pre L 1.27   FEV1-Predicted Pre % 68   FEV1-Post L 1.32   DLCO uncorrected ml/min/mmHg 15.69   DLCO UNC% % 70   DLCO corrected ml/min/mmHg 15.24   DLCO COR %Predicted % 68   DLVA Predicted % 104    Mild restriction, mildly reduced diffusing capacity, borderline mild obstruction     Assessment & Plan:   # Chronic cough Does sound like there is component of PND/UACS and may have unaddressed LPR.   # Bronchiectasis # Borderline mixed mild obstruction and restriction Unclear if related to  RA, immunosuppression from chronic steroid use. Bronch/bal in 2020 cultures unrevealing.  Plan: - sputum cultures - you will be called to schedule CT Chest  - take shower, clear nose of crusting and then use flonase 1 spray each nostril everyday till next visit - I will verify least expensive laba/lama inhaler for you and then prescribe it for you. Would stop breo once you get this new inhaler.  - take omeprazole 40 mg daily 30 minutes before  dinner everyday till next visit - after each use your new inhaler flutter valve 10 slow but firm puffs twice daily    Laryngeal irritation leading to perpetual cough: You need to try to suppress your cough to allow your larynx (voice box) to heal.  For three days don't talk, laugh, sing, or clear your throat. Do everything you can to suppress the cough during this time. Use hard candies (sugarless Jolly Ranchers) or non-mint or non-menthol containing cough drops during this time to soothe your throat.  Use a cough suppressant (Delsym) around the clock during this time.  After three days, gradually increase the use of your voice and back off on the cough suppressants.     Maryjane Hurter, MD [facility] 12/07/2022 11:26 AM

## 2022-12-07 ENCOUNTER — Ambulatory Visit: Payer: Medicare Other | Admitting: Student

## 2022-12-07 ENCOUNTER — Telehealth: Payer: Self-pay | Admitting: Student

## 2022-12-07 ENCOUNTER — Encounter: Payer: Self-pay | Admitting: Student

## 2022-12-07 VITALS — BP 154/84 | HR 102 | Temp 98.1°F | Ht 62.0 in | Wt 121.0 lb

## 2022-12-07 DIAGNOSIS — J479 Bronchiectasis, uncomplicated: Secondary | ICD-10-CM

## 2022-12-07 DIAGNOSIS — R053 Chronic cough: Secondary | ICD-10-CM

## 2022-12-07 MED ORDER — FLUTICASONE PROPIONATE 50 MCG/ACT NA SUSP: 1.0000 | Freq: Every day | NASAL | 11 refills | Status: DC

## 2022-12-07 MED ORDER — OMEPRAZOLE 40 MG PO CPDR: 40.0000 mg | DELAYED_RELEASE_CAPSULE | Freq: Every day | ORAL | 0 refills | Status: DC

## 2022-12-07 NOTE — Patient Instructions (Addendum)
-   sputum cultures - you will be called to schedule CT Chest  - take shower, clear nose of crusting and then use flonase 1 spray each nostril everyday till next visit - I will verify least expensive laba/lama inhaler for you and then prescribe it for you. Would stop breo once you get this new inhaler.  - take omeprazole 40 mg daily 30 minutes before dinner everyday till next visit - after each use your new inhaler flutter valve 10 slow but firm puffs twice daily    Laryngeal irritation leading to perpetual cough: You need to try to suppress your cough to allow your larynx (voice box) to heal.  For three days don't talk, laugh, sing, or clear your throat. Do everything you can to suppress the cough during this time. Use hard candies (sugarless Jolly Ranchers) or non-mint or non-menthol containing cough drops during this time to soothe your throat.  Use a cough suppressant (Delsym) around the clock during this time.  After three days, gradually increase the use of your voice and back off on the cough suppressants.

## 2022-12-07 NOTE — Telephone Encounter (Signed)
What is least expensive laba/lama for her?  Thanks! 

## 2022-12-10 ENCOUNTER — Ambulatory Visit (HOSPITAL_COMMUNITY)
Admission: RE | Admit: 2022-12-10 | Discharge: 2022-12-10 | Disposition: A | Discharge status: Home / Self Care | Payer: Medicare Other | Source: Ambulatory Visit | Attending: Student | Admitting: Student

## 2022-12-10 DIAGNOSIS — R053 Chronic cough: Secondary | ICD-10-CM | POA: Diagnosis not present

## 2022-12-12 NOTE — Telephone Encounter (Signed)
Dr. Verlee Monte can you please advise on patients CT results if available?

## 2022-12-12 NOTE — Telephone Encounter (Signed)
Pt. Calling to go over her ct results

## 2022-12-13 ENCOUNTER — Other Ambulatory Visit (HOSPITAL_COMMUNITY): Payer: Self-pay

## 2022-12-13 MED ORDER — BEVESPI AEROSPHERE 9-4.8 MCG/ACT IN AERO: 2.0000 | INHALATION_SPRAY | Freq: Two times a day (BID) | RESPIRATORY_TRACT | 11 refills | Status: DC

## 2022-12-13 NOTE — Telephone Encounter (Signed)
Per test claims for LABA+LAMA, results are the following:   Anoro $47.00 Stiolto $47.00 Bevespi $47.00

## 2022-12-14 ENCOUNTER — Telehealth: Payer: Self-pay | Admitting: Student

## 2022-12-14 NOTE — Telephone Encounter (Signed)
Dr. Verlee Monte can you please advise on patients CT results?

## 2022-12-14 NOTE — Telephone Encounter (Signed)
PT calling for CT results. Pls call @ [phone]

## 2022-12-17 NOTE — Telephone Encounter (Signed)
Spoke with pt who is requesting CT results. Dr. Verlee Monte please advise.

## 2022-12-19 NOTE — Telephone Encounter (Signed)
Pt calling again for results. I did advise that Dr Verlee Monte has not resulted the CT. She is upset as she is still coughing and concerned. She is wanting an inhaler as well. Preferably Breztri. She is out of town so it will need to be sent to a pharmacy near where she is. Please advise.

## 2022-12-19 NOTE — Telephone Encounter (Signed)
I attempted to call and left voicemail. I sent the following message on 12/13/22. I am not sure if she has seen it:  Your CT looks suggestive of either bronchiectasis exacerbation or chronic infection from a bacteria like nontuberculous mycobacteria. Have you had a chance to drop off sputum samples with our lab? Would you like to start an antibiotic for a couple of weeks to see that clears things up?   If you're not able to cough up sputum sample and a 2 week course of antibiotics failed to clear it up, then it may be worth pursuing bronchoscopy again to look for infection (same as you had done in 2020).   I've also prescribed bevespi 2 puffs twice daily to [facility] on [address]. Would start this and stop Breo.

## 2022-12-20 MED ORDER — BEVESPI AEROSPHERE 9-4.8 MCG/ACT IN AERO: 2.0000 | INHALATION_SPRAY | Freq: Two times a day (BID) | RESPIRATORY_TRACT | 11 refills | Status: DC

## 2022-12-20 MED ORDER — AMOXICILLIN-POT CLAVULANATE 875-125 MG PO TABS: 1.0000 | ORAL_TABLET | Freq: Two times a day (BID) | ORAL | 0 refills | Status: AC

## 2022-12-20 NOTE — Telephone Encounter (Signed)
Called and left voicemail. 2 week course of augmentin sent to her preferred pharmacy in [place]. Encouraged her to call if not improving and we can consider extending course or bronchoscopy.

## 2022-12-20 NOTE — Telephone Encounter (Signed)
Pt returned call. Stated to pt the info per Dr. Verlee Monte of her CT results. Asked pt if she is able to do a sputum sample and she said that she is not.  Pt would like to have an abx prescribed.  When stated to pt that Rx for Bevespi was sent to pharmacy, pt then said that she is currently at [facility] so I have sent the Rx for Bevespi to [facility] for pt as she will be there until at least 4/8.   Dr. Verlee Monte, please advise on the abx for pt as she has been having a hard time with her cough. Preferred pharmacy is shown for the [facility] in  [place].

## 2023-01-04 ENCOUNTER — Telehealth: Payer: Self-pay | Admitting: Student

## 2023-01-04 NOTE — Telephone Encounter (Signed)
ATC X1 LVM for patient to call the office back 

## 2023-01-04 NOTE — Telephone Encounter (Signed)
Please see last signed encounter. Megan Porter tried twice to call her back and closed encounter. Try Megan Porter again @ [phone]  I read back Megan Porter's last note. PT states she has had pneumonia 3 times and she would like to take the test that insures her lungs are clear. This test was mentioned in a VM that she has lost from Dr. Thora Lance.

## 2023-01-04 NOTE — Telephone Encounter (Signed)
PT calling back for [place] again. Please try again. TY.

## 2023-01-04 NOTE — Telephone Encounter (Signed)
ATC X1 LVM for patient to call the office back. Since patient has been called twice I am closing encounter. Please advise I'm not seeing any additional testing mentioned besides a chest CT which has been completed already

## 2023-01-04 NOTE — Telephone Encounter (Signed)
PT calling saying the meds Dr. Rhina Brackett in for her worked wonderful. It was like a miracle she said.   Also, Dr. Marolyn Haller a test and she would like to move forward with that. Please do not try to reach thru [facility]. Call back @ [phone]

## 2023-01-04 NOTE — Telephone Encounter (Signed)
Spoke with patient I have her scheduled for PFT and f/u with Dr. Thora Lance. NFN

## 2023-02-28 ENCOUNTER — Ambulatory Visit: Payer: Medicare Other | Admitting: Student

## 2023-02-28 ENCOUNTER — Ambulatory Visit (INDEPENDENT_AMBULATORY_CARE_PROVIDER_SITE_OTHER): Payer: Medicare Other | Admitting: Student

## 2023-02-28 DIAGNOSIS — R053 Chronic cough: Secondary | ICD-10-CM | POA: Diagnosis not present

## 2023-02-28 LAB — PULMONARY FUNCTION TEST
DL/VA % pred: 104 %
DL/VA: 4.28 ml/min/mmHg/L
DLCO cor % pred: 79 %
DLCO cor: 13.79 ml/min/mmHg
DLCO unc % pred: 79 %
DLCO unc: 13.79 ml/min/mmHg
FEF 25-75 Post: 0.9 L/sec
FEF 25-75 Pre: 0.36 L/sec
FEF2575-%Change-Post: 148 %
FEF2575-%Pred-Post: 82 %
FEF2575-%Pred-Pre: 33 %
FEV1-%Change-Post: 83 %
FEV1-%Pred-Post: 68 %
FEV1-%Pred-Pre: 37 %
FEV1-Post: 1.1 L
FEV1-Pre: 0.6 L
FEV1FVC-%Change-Post: 92 %
FEV1FVC-%Pred-Pre: 56 %
FEV6-%Change-Post: 0 %
FEV6-%Pred-Post: 67 %
FEV6-%Pred-Pre: 67 %
FEV6-Post: 1.38 L
FEV6-Pre: 1.38 L
FEV6FVC-%Pred-Post: 106 %
FEV6FVC-%Pred-Pre: 106 %
FVC-%Change-Post: -4 %
FVC-%Pred-Post: 62 %
FVC-%Pred-Pre: 65 %
FVC-Post: 1.38 L
FVC-Pre: 1.44 L
Post FEV1/FVC ratio: 80 %
Post FEV6/FVC ratio: 100 %
Pre FEV1/FVC ratio: 41 %
Pre FEV6/FVC Ratio: 100 %
RV % pred: 108 %
RV: 2.56 L
TLC % pred: 85 %
TLC: 4.07 L

## 2023-02-28 NOTE — Progress Notes (Deleted)
Office Visit Note  Patient: Megan Porter             Date of Birth: 03/08/1941           MRN: [id number]             PCP: Gaspar Garbe, MD Referring: Gaspar Garbe, MD Visit Date: 03/14/2023 Occupation: @GUAROCC @  Subjective:  No chief complaint on file.   History of Present Illness: Megan Porter is a 82 y.o. female ***     Activities of Daily Living:  Patient reports morning stiffness for *** {minute/hour:19697}.   Patient {ACTIONS;DENIES/REPORTS:21021675::"Denies"} nocturnal pain.  Difficulty dressing/grooming: {ACTIONS;DENIES/REPORTS:21021675::"Denies"} Difficulty climbing stairs: {ACTIONS;DENIES/REPORTS:21021675::"Denies"} Difficulty getting out of chair: {ACTIONS;DENIES/REPORTS:21021675::"Denies"} Difficulty using hands for taps, buttons, cutlery, and/or writing: {ACTIONS;DENIES/REPORTS:21021675::"Denies"}  No Rheumatology ROS completed.   PMFS History:  Patient Active Problem List   Diagnosis Date Noted   Abnormal CT scan of lung    Hypokalemia 09/21/2018   Benign essential HTN 09/21/2018   Leucocytosis 09/21/2018   Pneumonia 09/21/2018   Bronchiectasis without complication (HCC) 05/21/2017   Primary osteoarthritis of both knees 04/06/2017   DDD (degenerative disc disease), lumbar history of lumbar surgery 1970 04/06/2017   Age-related osteoporosis without current pathological fracture 04/06/2017   History of anxiety 04/06/2017   History of breast cancer 04/04/2017   Primary osteoarthritis of both feet 10/02/2016   Primary osteoarthritis of both hands 10/02/2016   High risk medications (not anticoagulants) long-term use 10/02/2016   Rheumatoid arthritis, seronegative, multiple sites (HCC) 10/02/2016    Past Medical History:  Diagnosis Date   Arthritis    Cancer (HCC)    Osteoarthritis     Family History  Family history unknown: Yes   Past Surgical History:  Procedure Laterality Date   BACK SURGERY     BREAST RECONSTRUCTION     knee  arthroscopic     VIDEO BRONCHOSCOPY Bilateral 07/06/2019   Procedure: VIDEO BRONCHOSCOPY WITHOUT FLUORO;  Surgeon: Chilton Greathouse, MD;  Location: [facility];  Service: Cardiopulmonary;  Laterality: Bilateral;   Social History   Social History Narrative   Not on file   Immunization History  Administered Date(s) Administered   Fluad Quad(high Dose 65+) 05/29/2019   Influenza-Unspecified 07/06/2017, 07/24/2018   PFIZER(Purple Top)SARS-COV-2 Vaccination 10/07/2019, 10/28/2019, 06/28/2020, 06/05/2021   Pneumococcal Conjugate-13 04/13/2014   Pneumococcal Polysaccharide-23 10/13/2018     Objective: Vital Signs: There were no vitals taken for this visit.   Physical Exam   Musculoskeletal Exam: ***  CDAI Exam: CDAI Score: -- Patient Global: --; Provider Global: -- Swollen: --; Tender: -- Joint Exam 03/14/2023   No joint exam has been documented for this visit   There is currently no information documented on the homunculus. Go to the Rheumatology activity and complete the homunculus joint exam.  Investigation: No additional findings.  Imaging: No results found.  Recent Labs: Lab Results  Component Value Date   WBC 8.8 10/10/2022   HGB 12.0 10/10/2022   PLT 330 10/10/2022   NA 142 10/10/2022   K 4.6 10/10/2022   CL 105 10/10/2022   CO2 27 10/10/2022   GLUCOSE 100 (H) 10/10/2022   BUN 12 10/10/2022   CREATININE 0.68 10/10/2022   BILITOT 0.3 10/10/2022   ALKPHOS 70 09/25/2018   AST 15 10/10/2022   ALT 11 10/10/2022   PROT 7.2 10/10/2022   ALBUMIN 1.8 (L) 09/25/2018   CALCIUM 8.4 (L) 10/10/2022   GFRAA 78 01/04/2021    Speciality Comments: PLQ Eye Exam  01/10/2023 WNL  @ [facility] Follow up in 1 year    Procedures:  No procedures performed Allergies: Patient has no known allergies.   Assessment / Plan:     Visit Diagnoses: No diagnosis found.  Orders: No orders of the defined types were placed in this encounter.  No orders of the  defined types were placed in this encounter.   Face-to-face time spent with patient was *** minutes. Greater than 50% of time was spent in counseling and coordination of care.  Follow-Up Instructions: No follow-ups on file.   Ellen Henri, CMA  Note - This record has been created using Animal nutritionist.  Chart creation errors have been sought, but may not always  have been located. Such creation errors do not reflect on  the standard of medical care.

## 2023-02-28 NOTE — Patient Instructions (Signed)
Full PFT performed today. °

## 2023-02-28 NOTE — Progress Notes (Signed)
Full PFT performed today. °

## 2023-03-14 ENCOUNTER — Ambulatory Visit: Payer: Medicare Other | Admitting: Rheumatology

## 2023-03-14 ENCOUNTER — Other Ambulatory Visit: Payer: Self-pay | Admitting: Student

## 2023-03-14 DIAGNOSIS — M0609 Rheumatoid arthritis without rheumatoid factor, multiple sites: Secondary | ICD-10-CM

## 2023-03-14 DIAGNOSIS — Z8659 Personal history of other mental and behavioral disorders: Secondary | ICD-10-CM

## 2023-03-14 DIAGNOSIS — Z79899 Other long term (current) drug therapy: Secondary | ICD-10-CM

## 2023-03-14 DIAGNOSIS — S52591D Other fractures of lower end of right radius, subsequent encounter for closed fracture with routine healing: Secondary | ICD-10-CM

## 2023-03-14 DIAGNOSIS — R29898 Other symptoms and signs involving the musculoskeletal system: Secondary | ICD-10-CM

## 2023-03-14 DIAGNOSIS — M5136 Other intervertebral disc degeneration, lumbar region: Secondary | ICD-10-CM

## 2023-03-14 DIAGNOSIS — R2689 Other abnormalities of gait and mobility: Secondary | ICD-10-CM

## 2023-03-14 DIAGNOSIS — Z853 Personal history of malignant neoplasm of breast: Secondary | ICD-10-CM

## 2023-03-14 DIAGNOSIS — M160 Bilateral primary osteoarthritis of hip: Secondary | ICD-10-CM

## 2023-03-14 DIAGNOSIS — M810 Age-related osteoporosis without current pathological fracture: Secondary | ICD-10-CM

## 2023-03-14 DIAGNOSIS — Z9889 Other specified postprocedural states: Secondary | ICD-10-CM

## 2023-03-14 DIAGNOSIS — M19072 Primary osteoarthritis, left ankle and foot: Secondary | ICD-10-CM

## 2023-03-14 DIAGNOSIS — M170 Bilateral primary osteoarthritis of knee: Secondary | ICD-10-CM

## 2023-03-14 DIAGNOSIS — M19042 Primary osteoarthritis, left hand: Secondary | ICD-10-CM

## 2023-04-01 ENCOUNTER — Ambulatory Visit: Payer: Medicare Other | Admitting: Primary Care

## 2023-04-03 ENCOUNTER — Ambulatory Visit (INDEPENDENT_AMBULATORY_CARE_PROVIDER_SITE_OTHER): Payer: Medicare Other

## 2023-04-03 ENCOUNTER — Encounter: Payer: Self-pay | Admitting: Primary Care

## 2023-04-03 ENCOUNTER — Ambulatory Visit: Payer: Medicare Other | Admitting: Primary Care

## 2023-04-03 VITALS — BP 138/76 | HR 91 | Ht 63.0 in | Wt 123.2 lb

## 2023-04-03 DIAGNOSIS — I498 Other specified cardiac arrhythmias: Secondary | ICD-10-CM

## 2023-04-03 DIAGNOSIS — J479 Bronchiectasis, uncomplicated: Secondary | ICD-10-CM | POA: Diagnosis not present

## 2023-04-03 DIAGNOSIS — R053 Chronic cough: Secondary | ICD-10-CM

## 2023-04-03 NOTE — Patient Instructions (Addendum)
Augmentin was the antibiotic that cleared up your symptoms   Recommendations: If cough returns start mucinex 1200mg  twice daily, resume Bevespi and call office for sooner apt   Orders: ECG re: arrhythmia  CXR re: bronchiectasis   Follow-up: 3-4 months with Dr. Isaiah Serge (bronchiectasis)

## 2023-04-03 NOTE — Progress Notes (Signed)
@Patient  ID: Megan Porter, female    DOB: September 23, 1940, 82 y.o.   MRN: [id number]  Chief Complaint  Patient presents with   Follow-up    Referring provider: Tisovec, Adelfa Koh, MD  HPI: 82 year old female, never smoked.  Past medical history significant for tension, bronchiectasis without complication, osteoporosis, history of breast cancer. Former patient of Dr. Isaiah Serge, last seen by Dr. Thora Lance on 12/07/22.  Previous [facility] pulmonary encounter:  81yF with history of bronchiectasis, HTN, breast cancer, seronegative RA referred for chronic cough  Had bronchoscopy 2020 with negative cultures on BAL.   Coughing for a couple months. Was then treated with antibiotics, prednisone taper. There is occasionally phlegm. No fever. She does feel more dypsneic. She is not taking symbicort anymore. SHe is taking breo 100 1 puff once daily but she's not convinced it's helpful. No sinus congestion. Minor extent of postnasal drainage. No reflux.   Otherwise pertinent review of systems is negative.  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 Never smoker, no vaping/MJ.    04/03/2023 Patient presents today for follow-up to review pulmonary function testing. Hx bronchiectasis, seronegative RA. Referred for chronic cough. She was never able to provide a sputum sample. She was given a 2 week course of Augmentin which clear up her cough completely. She tells me that antibiotic was a "miracle drug". She has some shortness of breath with activity, this does not prevent her from doing normal ADLs. Recovers with rest. She is not currently taking any inhalers, prednisone or reflux medication. She is on Plaquenil for RA. She has some arthralgia, seeing rheumatology next month.    No Known Allergies  Immunization History  Administered Date(s) Administered   Fluad Quad(high Dose 65+) 05/29/2019   Influenza-Unspecified 07/06/2017, 07/24/2018   PFIZER(Purple Top)SARS-COV-2 Vaccination  10/07/2019, 10/28/2019, 06/28/2020, 06/05/2021   Pneumococcal Conjugate-13 04/13/2014   Pneumococcal Polysaccharide-23 10/13/2018   Td (Adult),5 Lf Tetanus Toxid, Preservative Free 08/19/2021    Past Medical History:  Diagnosis Date   Arthritis    Cancer (HCC)    Osteoarthritis     Tobacco History: Social History   Tobacco Use  Smoking Status Never   Passive exposure: Never  Smokeless Tobacco Never   Counseling given: Not Answered   Outpatient Medications Prior to Visit  Medication Sig Dispense Refill   Biotin 5 MG CAPS Take 5,000 mg by mouth daily.     calcium-vitamin D (OSCAL WITH D) 500-200 MG-UNIT TABS tablet Take by mouth.     Cholecalciferol (VITAMIN D) 50 MCG (2000 UT) CAPS Take 2,000 Units by mouth daily.      citalopram (CELEXA) 40 MG tablet TAKE 1 TABLET BY MOUTH  DAILY (Patient taking differently: Take 40 mg by mouth daily.) 90 tablet 1   diphenhydrAMINE-APAP, sleep, (TYLENOL PM EXTRA STRENGTH PO) Take by mouth.     hydroxychloroquine (PLAQUENIL) 200 MG tablet TAKE 1 TABLET BY MOUTH DAILY 90 tablet 3   levothyroxine (SYNTHROID) 75 MCG tablet Take 75 mcg by mouth daily.     Multiple Vitamin (MULTIVITAMIN) capsule Take 1 capsule by mouth daily.     SODIUM FLUORIDE, DENTAL RINSE, (PREVIDENT) 0.2 % SOLN      albuterol (VENTOLIN HFA) 108 (90 Base) MCG/ACT inhaler 2 puffs as needed for shortness of breath, wheezing, tightness Inhalation every 4 hrs for 30 days (Patient not taking: Reported on 04/03/2023)     fluticasone (FLONASE) 50 MCG/ACT nasal spray Place 1 spray into both nostrils daily. 16 g  11   Glycopyrrolate-Formoterol (BEVESPI AEROSPHERE) 9-4.8 MCG/ACT AERO Inhale 2 puffs into the lungs 2 (two) times daily. 1 each 11   levothyroxine (SYNTHROID, LEVOTHROID) 50 MCG tablet Take 50 mcg by mouth daily before breakfast.  (Patient not taking: Reported on 10/10/2022)     mupirocin ointment (BACTROBAN) 2 % SMARTSIG:1 Application Topical 2-3 Times Daily     omeprazole  (PRILOSEC) 40 MG capsule TAKE 1 CAPSULE(40 MG) BY MOUTH DAILY 90 capsule 0   predniSONE (STERAPRED UNI-PAK 21 TAB) 5 MG (21) TBPK tablet Take by mouth.     No facility-administered medications prior to visit.   Review of Systems  Review of Systems  Constitutional: Negative.   HENT: Negative.    Respiratory:  Negative for cough, shortness of breath and wheezing.      Physical Exam  BP 138/76 (BP Location: Left Arm, Patient Position: Sitting, Cuff Size: Normal)   Pulse 91   Ht 5\' 3"  (1.6 m)   Wt 123 lb 3.2 oz (55.9 kg)   SpO2 96%   BMI 21.82 kg/m  Physical Exam Constitutional:      Appearance: Normal appearance.  HENT:     Head: Normocephalic and atraumatic.  Cardiovascular:     Rate and Rhythm: Normal rate and regular rhythm.     Comments: Regularly irregular - ECG normal sinus rhythm  Pulmonary:     Effort: Pulmonary effort is normal.     Breath sounds: Normal breath sounds. No wheezing or rales.  Musculoskeletal:        General: Normal range of motion.  Skin:    General: Skin is warm and dry.  Neurological:     General: No focal deficit present.     Mental Status: She is alert and oriented to person, place, and time. Mental status is at baseline.  Psychiatric:        Mood and Affect: Mood normal.        Behavior: Behavior normal.        Thought Content: Thought content normal.        Judgment: Judgment normal.      Lab Results:  CBC    Component Value Date/Time   WBC 8.8 10/10/2022 1049   RBC 3.68 (L) 10/10/2022 1049   HGB 12.0 10/10/2022 1049   HCT 35.4 10/10/2022 1049   PLT 330 10/10/2022 1049   MCV 96.2 10/10/2022 1049   MCH 32.6 10/10/2022 1049   MCHC 33.9 10/10/2022 1049   RDW 11.6 10/10/2022 1049   LYMPHSABS 810 (L) 10/10/2022 1049   MONOABS 1.0 09/21/2018 2031   EOSABS 150 10/10/2022 1049   BASOSABS 70 10/10/2022 1049    BMET    Component Value Date/Time   NA 142 10/10/2022 1049   K 4.6 10/10/2022 1049   CL 105 10/10/2022 1049   CO2  27 10/10/2022 1049   GLUCOSE 100 (H) 10/10/2022 1049   BUN 12 10/10/2022 1049   CREATININE 0.68 10/10/2022 1049   CALCIUM 8.4 (L) 10/10/2022 1049   GFRNONAA 67 01/04/2021 1429   GFRAA 78 01/04/2021 1429    BNP    Component Value Date/Time   BNP 812.8 (H) 09/24/2018 0656    ProBNP No results found for: "PROBNP"  Imaging: DG Chest 2 View  Result Date: 04/13/2023 CLINICAL DATA:  Follow-up pneumonia.  Cough has resolved. EXAM: CHEST - 2 VIEW COMPARISON:  07/06/2019 radiograph and chest CT 12/10/2022 FINDINGS: Patchy airspace disease with reticulonodular appearance and bilateral hazy density from breast implants. No change  from most recent chest CT where airspace opacity may reflect new baseline chronic airspace disease. No acute consolidation, edema, effusion, or pneumothorax. Normal heart size. Left axillary dissection. IMPRESSION: Chronic lung disease without progression from chest CT 02/09/2023. Electronically Signed   By: Tiburcio Pea M.D.   On: 04/13/2023 08:59     Assessment & Plan:   Bronchiectasis without complication (HCC) - Cough resolved after completing course of Augmentin  - Advised patient if cough returns start mucinex 1200mg  twice daily, resume Bevespi and call office for sooner apt   Orders: CXR re: bronchiectasis   Follow-up: 3-4 months with Dr. Isaiah Serge (bronchiectasis)     Glenford Bayley, NP 04/15/2023

## 2023-04-11 NOTE — Progress Notes (Signed)
Office Visit Note  Patient: Megan Porter             Date of Birth: Jun 16, 1941           MRN: [id number]             PCP: Gaspar Garbe, MD Referring: Gaspar Garbe, MD Visit Date: 04/24/2023 Occupation: @GUAROCC @  Subjective:  Joint stiffness and fatigue  History of Present Illness: Megan Porter is a 82 y.o. female with seronegative rheumatoid arthritis, osteoarthritis, degenerative disc disease and osteoporosis.  She states she has been experiencing increased joint stiffness and fatigue.  She felt better while she was on methotrexate but it had to be discontinued due to frequent infections.  She has not noticed any joint swelling.  She continues to take hydroxychloroquine 200 mg by mouth daily without any interruption.  Her eye examination was in April 2024 which was within normal limits.  She has not noticed joint swelling.  She continues to have intermittent pain and discomfort in her hands and knee joints.  She feels that her muscle strength has improved but she still has generalized deconditioning.  She has been experiencing increased fatigue.  She is followed by pulmonologist for bronchiectasis.  He has not had any recent infections.    Activities of Daily Living:  Patient reports morning stiffness for 1-2 hours.   Patient Denies nocturnal pain.  Difficulty dressing/grooming: Denies Difficulty climbing stairs: Reports Difficulty getting out of chair: Denies Difficulty using hands for taps, buttons, cutlery, and/or writing: Reports  Review of Systems  Constitutional:  Positive for fatigue.  HENT:  Positive for mouth dryness. Negative for mouth sores.   Eyes:  Negative for dryness.  Respiratory:  Positive for shortness of breath.        With activity   Cardiovascular:  Negative for chest pain and palpitations.  Gastrointestinal:  Positive for constipation. Negative for blood in stool and diarrhea.  Endocrine: Negative for increased urination.  Genitourinary:   Negative for involuntary urination.  Musculoskeletal:  Positive for joint pain, joint pain, joint swelling and morning stiffness. Negative for myalgias, muscle weakness, muscle tenderness and myalgias.  Skin:  Negative for color change, rash, hair loss and sensitivity to sunlight.  Allergic/Immunologic: Negative for susceptible to infections.  Neurological:  Negative for dizziness and headaches.  Hematological:  Negative for swollen glands.  Psychiatric/Behavioral:  Negative for depressed mood and sleep disturbance. The patient is not nervous/anxious.     PMFS History:  Patient Active Problem List   Diagnosis Date Noted   Abnormal CT scan of lung    Hypokalemia 09/21/2018   Benign essential HTN 09/21/2018   Leucocytosis 09/21/2018   Pneumonia 09/21/2018   Bronchiectasis without complication (HCC) 05/21/2017   Primary osteoarthritis of both knees 04/06/2017   DDD (degenerative disc disease), lumbar history of lumbar surgery 1970 04/06/2017   Age-related osteoporosis without current pathological fracture 04/06/2017   History of anxiety 04/06/2017   History of breast cancer 04/04/2017   Primary osteoarthritis of both feet 10/02/2016   Primary osteoarthritis of both hands 10/02/2016   High risk medications (not anticoagulants) long-term use 10/02/2016   Rheumatoid arthritis, seronegative, multiple sites (HCC) 10/02/2016    Past Medical History:  Diagnosis Date   Arthritis    Cancer (HCC)    Osteoarthritis     Family History  Family history unknown: Yes   Past Surgical History:  Procedure Laterality Date   BACK SURGERY     BREAST RECONSTRUCTION  knee arthroscopic     VIDEO BRONCHOSCOPY Bilateral 07/06/2019   Procedure: VIDEO BRONCHOSCOPY WITHOUT FLUORO;  Surgeon: Chilton Greathouse, MD;  Location: [facility];  Service: Cardiopulmonary;  Laterality: Bilateral;   Social History   Social History Narrative   Not on file   Immunization History  Administered Date(s)  Administered   Fluad Quad(high Dose 65+) 05/29/2019   Influenza-Unspecified 07/06/2017, 07/24/2018   PFIZER(Purple Top)SARS-COV-2 Vaccination 10/07/2019, 10/28/2019, 06/28/2020, 06/05/2021   Pneumococcal Conjugate-13 04/13/2014   Pneumococcal Polysaccharide-23 10/13/2018   Td (Adult),5 Lf Tetanus Toxid, Preservative Free 08/19/2021     Objective: Vital Signs: BP (!) 166/83 (BP Location: Left Arm, Patient Position: Sitting, Cuff Size: Normal)   Pulse 78   Resp 13   Ht 5\' 2"  (1.575 m)   Wt 123 lb 6.4 oz (56 kg)   BMI 22.57 kg/m    Physical Exam Vitals and nursing note reviewed.  Constitutional:      Appearance: She is well-developed.  HENT:     Head: Normocephalic and atraumatic.  Eyes:     Conjunctiva/sclera: Conjunctivae normal.  Cardiovascular:     Rate and Rhythm: Normal rate and regular rhythm.     Heart sounds: Normal heart sounds.  Pulmonary:     Effort: Pulmonary effort is normal.     Breath sounds: Normal breath sounds.  Abdominal:     General: Bowel sounds are normal.     Palpations: Abdomen is soft.  Musculoskeletal:     Cervical back: Normal range of motion.  Lymphadenopathy:     Cervical: No cervical adenopathy.  Skin:    General: Skin is warm and dry.     Capillary Refill: Capillary refill takes less than 2 seconds.  Neurological:     Mental Status: She is alert and oriented to person, place, and time.  Psychiatric:        Behavior: Behavior normal.      Musculoskeletal Exam: She has some limitation with lateral rotation.  Thoracic kyphosis was noted.  There was no tenderness over thoracic or lumbar spine.  Shoulder joints, elbow joints, wrist joints, MCPs PIPs and DIPs were in good range of motion.  Synovial thickening over MCP joints was noted.  PIP and DIP thickening was noted.  She had limited range of motion of bilateral hips more on the right than the left.  Knee joints were in good range of motion without any warmth swelling or effusion.  Left knee  joint is replaced.  There was no tenderness over ankles or MTPs.  CDAI Exam: CDAI Score: -- Patient Global: 1 / 100; Provider Global: 1 / 100 Swollen: --; Tender: -- Joint Exam 04/24/2023   No joint exam has been documented for this visit   There is currently no information documented on the homunculus. Go to the Rheumatology activity and complete the homunculus joint exam.  Investigation: No additional findings.  Imaging: DG Chest 2 View  Result Date: 04/13/2023 CLINICAL DATA:  Follow-up pneumonia.  Cough has resolved. EXAM: CHEST - 2 VIEW COMPARISON:  07/06/2019 radiograph and chest CT 12/10/2022 FINDINGS: Patchy airspace disease with reticulonodular appearance and bilateral hazy density from breast implants. No change from most recent chest CT where airspace opacity may reflect new baseline chronic airspace disease. No acute consolidation, edema, effusion, or pneumothorax. Normal heart size. Left axillary dissection. IMPRESSION: Chronic lung disease without progression from chest CT 02/09/2023. Electronically Signed   By: Tiburcio Pea M.D.   On: 04/13/2023 08:59    Recent Labs: Lab Results  Component Value Date   WBC 8.8 10/10/2022   HGB 12.0 10/10/2022   PLT 330 10/10/2022   NA 142 10/10/2022   K 4.6 10/10/2022   CL 105 10/10/2022   CO2 27 10/10/2022   GLUCOSE 100 (H) 10/10/2022   BUN 12 10/10/2022   CREATININE 0.68 10/10/2022   BILITOT 0.3 10/10/2022   ALKPHOS 70 09/25/2018   AST 15 10/10/2022   ALT 11 10/10/2022   PROT 7.2 10/10/2022   ALBUMIN 1.8 (L) 09/25/2018   CALCIUM 8.4 (L) 10/10/2022   GFRAA 78 01/04/2021    Speciality Comments: PLQ Eye Exam 01/10/2023 WNL  @ [facility] Follow up in 1 year    Procedures:  No procedures performed Allergies: Patient has no known allergies.   Assessment / Plan:     Visit Diagnoses: Rheumatoid arthritis, seronegative, multiple sites (HCC) - Erosive disease, neg RF,CCP, and ANA: -Patient complains of  increased joint pain and joint stiffness.  No synovitis was noted on the examination patient.  Synovial thickening was noted.  She has been on hydroxychloroquine 200 mg p.o. daily which she has been tolerating well without any interruption.  I will check sed rate today.  I had a detailed discussion with the patient that she does not need more aggressive immunosuppressive agent based on her exam.  Plan: Sedimentation rate  High risk medications (not anticoagulants) long-term use - Plaquenil 200 mg 1 tablet by mouth daily. D/c MTX in April 2020 due to recurrent infections. PLQ Eye Exam 01/10/2023 - Plan: CBC with Differential/Platelet, COMPLETE METABOLIC PANEL WITH GFR today.  Primary osteoarthritis of both hands-she has lateral PIP and DIP thickening due to osteoarthritis.  Joint stiffness is most likely due to osteoarthritis.  Range of motion exercises were advised.  S/P left knee arthroscopy - Performed by Dr. Lequita Halt.  She had good range of motion without any discomfort.  Primary osteoarthritis of both hips-she has some limitation with range of motion of her right hip.  Primary osteoarthritis of both knees-she had good range of motion of bilateral knee joints without any warmth swelling or effusion.  Primary osteoarthritis of both feet-PIP and DIP thickening with no synovitis was noted.  DDD (degenerative disc disease), lumbar history of lumbar surgery 1970-she had limited mobility without any point tenderness.  Age-related osteoporosis without current pathological fracture - DEXA ordered and followed by PCP.  No longer taking fosamax.  Patient was treated with Evista and Fosamax in the past.  Patient does not recall when she had the last bone density.  I advised her to discuss with Dr. Wylene Simmer.  Vitamin D deficiency -I will check vitamin D level today.  Plan: VITAMIN D 25 Hydroxy (Vit-D Deficiency, Fractures)  Other fatigue -she complains of increased fatigue.  Plan: TSH  Other closed  fracture of distal end of right radius with routine healing, subsequent encounter - Evaluated by Dr. Roney Mans in February 2022.  Poor balance-need for regular exercise was emphasized.  I offered physical therapy which she declined.  Muscular deconditioning-I advised her to stay active.  History of anxiety  History of breast cancer  Bronchiectasis without complication (HCC)-patient denies having an infection since her last visit.  She has been followed by pulmonologist.  Orders: Orders Placed This Encounter  Procedures   CBC with Differential/Platelet   COMPLETE METABOLIC PANEL WITH GFR   Sedimentation rate   TSH   VITAMIN D 25 Hydroxy (Vit-D Deficiency, Fractures)   No orders of the defined types were placed in this encounter.  Follow-Up Instructions: Return in about 5 months (around 09/24/2023) for Rheumatoid arthritis.   Pollyann Savoy, MD  Note - This record has been created using Animal nutritionist.  Chart creation errors have been sought, but may not always  have been located. Such creation errors do not reflect on  the standard of medical care.

## 2023-04-15 NOTE — Assessment & Plan Note (Addendum)
-   Cough resolved after completing course of Augmentin  - Advised patient if cough returns start mucinex 1200mg  twice daily, resume Bevespi and call office for sooner apt   Orders: CXR re: bronchiectasis   Follow-up: 3-4 months with Dr. Isaiah Serge (bronchiectasis)

## 2023-04-24 ENCOUNTER — Ambulatory Visit: Payer: Medicare Other | Attending: Rheumatology | Admitting: Rheumatology

## 2023-04-24 ENCOUNTER — Encounter: Payer: Self-pay | Admitting: Rheumatology

## 2023-04-24 VITALS — BP 166/83 | HR 78 | Resp 13 | Ht 62.0 in | Wt 123.4 lb

## 2023-04-24 DIAGNOSIS — R2689 Other abnormalities of gait and mobility: Secondary | ICD-10-CM

## 2023-04-24 DIAGNOSIS — M51369 Other intervertebral disc degeneration, lumbar region without mention of lumbar back pain or lower extremity pain: Secondary | ICD-10-CM

## 2023-04-24 DIAGNOSIS — Z9889 Other specified postprocedural states: Secondary | ICD-10-CM

## 2023-04-24 DIAGNOSIS — M160 Bilateral primary osteoarthritis of hip: Secondary | ICD-10-CM

## 2023-04-24 DIAGNOSIS — R29898 Other symptoms and signs involving the musculoskeletal system: Secondary | ICD-10-CM

## 2023-04-24 DIAGNOSIS — M19071 Primary osteoarthritis, right ankle and foot: Secondary | ICD-10-CM

## 2023-04-24 DIAGNOSIS — M19042 Primary osteoarthritis, left hand: Secondary | ICD-10-CM

## 2023-04-24 DIAGNOSIS — M5136 Other intervertebral disc degeneration, lumbar region: Secondary | ICD-10-CM

## 2023-04-24 DIAGNOSIS — M810 Age-related osteoporosis without current pathological fracture: Secondary | ICD-10-CM

## 2023-04-24 DIAGNOSIS — M0609 Rheumatoid arthritis without rheumatoid factor, multiple sites: Secondary | ICD-10-CM | POA: Diagnosis not present

## 2023-04-24 DIAGNOSIS — Z79899 Other long term (current) drug therapy: Secondary | ICD-10-CM | POA: Diagnosis not present

## 2023-04-24 DIAGNOSIS — M19072 Primary osteoarthritis, left ankle and foot: Secondary | ICD-10-CM

## 2023-04-24 DIAGNOSIS — E559 Vitamin D deficiency, unspecified: Secondary | ICD-10-CM

## 2023-04-24 DIAGNOSIS — S52591D Other fractures of lower end of right radius, subsequent encounter for closed fracture with routine healing: Secondary | ICD-10-CM

## 2023-04-24 DIAGNOSIS — M19041 Primary osteoarthritis, right hand: Secondary | ICD-10-CM | POA: Diagnosis not present

## 2023-04-24 DIAGNOSIS — M170 Bilateral primary osteoarthritis of knee: Secondary | ICD-10-CM

## 2023-04-24 DIAGNOSIS — Z8659 Personal history of other mental and behavioral disorders: Secondary | ICD-10-CM

## 2023-04-24 DIAGNOSIS — J479 Bronchiectasis, uncomplicated: Secondary | ICD-10-CM

## 2023-04-24 DIAGNOSIS — Z853 Personal history of malignant neoplasm of breast: Secondary | ICD-10-CM

## 2023-04-24 DIAGNOSIS — R5383 Other fatigue: Secondary | ICD-10-CM

## 2023-04-24 LAB — CBC WITH DIFFERENTIAL/PLATELET
Absolute Monocytes: 442 cells/uL (ref 200–950)
Basophils Absolute: 51 cells/uL (ref 0–200)
Basophils Relative: 0.8 %
Eosinophils Absolute: 109 cells/uL (ref 15–500)
Eosinophils Relative: 1.7 %
HCT: 37.9 % (ref 35.0–45.0)
Hemoglobin: 12.6 g/dL (ref 11.7–15.5)
Lymphs Abs: 1050 cells/uL (ref 850–3900)
MCH: 31.7 pg (ref 27.0–33.0)
MCHC: 33.2 g/dL (ref 32.0–36.0)
MCV: 95.2 fL (ref 80.0–100.0)
MPV: 10.3 fL (ref 7.5–12.5)
Monocytes Relative: 6.9 %
Neutro Abs: 4749 cells/uL (ref 1500–7800)
Neutrophils Relative %: 74.2 %
Platelets: 246 10*3/uL (ref 140–400)
RBC: 3.98 10*6/uL (ref 3.80–5.10)
RDW: 12.8 % (ref 11.0–15.0)
Total Lymphocyte: 16.4 %
WBC: 6.4 10*3/uL (ref 3.8–10.8)

## 2023-04-24 LAB — SEDIMENTATION RATE: Sed Rate: 43 mm/h — ABNORMAL HIGH (ref 0–30)

## 2023-04-24 NOTE — Patient Instructions (Signed)
Vaccines You are taking a medication(s) that can suppress your immune system.  The following immunizations are recommended: Flu annually Covid-19  RSV Td/Tdap (tetanus, diphtheria, pertussis) every 10 years Pneumonia (Prevnar 15 then Pneumovax 23 at least 1 year apart.  Alternatively, can take Prevnar 20 without needing additional dose) Shingrix: 2 doses from 4 weeks to 6 months apart  Please check with your PCP to make sure you are up to date.  

## 2023-06-17 ENCOUNTER — Other Ambulatory Visit (HOSPITAL_COMMUNITY): Payer: Self-pay | Admitting: *Deleted

## 2023-06-18 ENCOUNTER — Ambulatory Visit (HOSPITAL_COMMUNITY)
Admission: RE | Admit: 2023-06-18 | Discharge: 2023-06-18 | Disposition: A | Discharge status: Home / Self Care | Payer: Medicare Other | Source: Ambulatory Visit | Attending: Internal Medicine | Admitting: Internal Medicine

## 2023-06-18 DIAGNOSIS — M810 Age-related osteoporosis without current pathological fracture: Secondary | ICD-10-CM | POA: Diagnosis present

## 2023-06-18 MED ORDER — DENOSUMAB 60 MG/ML ~~LOC~~ SOSY
PREFILLED_SYRINGE | SUBCUTANEOUS | Status: AC
  Filled 2023-06-18: qty 1

## 2023-06-18 MED ORDER — DENOSUMAB 60 MG/ML ~~LOC~~ SOSY
60.0000 mg | PREFILLED_SYRINGE | Freq: Once | SUBCUTANEOUS | Status: AC
  Administered 2023-06-18: 60 mg via SUBCUTANEOUS

## 2023-07-02 ENCOUNTER — Emergency Department (HOSPITAL_COMMUNITY): Payer: Medicare Other

## 2023-07-02 ENCOUNTER — Encounter (HOSPITAL_COMMUNITY): Payer: Self-pay | Admitting: Emergency Medicine

## 2023-07-02 ENCOUNTER — Emergency Department (HOSPITAL_COMMUNITY)
Admission: EM | Admit: 2023-07-02 | Discharge: 2023-07-02 | Disposition: A | Discharge status: Home / Self Care | Payer: Medicare Other | Attending: Emergency Medicine | Admitting: Emergency Medicine

## 2023-07-02 ENCOUNTER — Other Ambulatory Visit: Payer: Self-pay

## 2023-07-02 DIAGNOSIS — S0012XA Contusion of left eyelid and periocular area, initial encounter: Secondary | ICD-10-CM | POA: Diagnosis not present

## 2023-07-02 DIAGNOSIS — S40212A Abrasion of left shoulder, initial encounter: Secondary | ICD-10-CM | POA: Diagnosis not present

## 2023-07-02 DIAGNOSIS — S80212A Abrasion, left knee, initial encounter: Secondary | ICD-10-CM | POA: Insufficient documentation

## 2023-07-02 DIAGNOSIS — W19XXXA Unspecified fall, initial encounter: Secondary | ICD-10-CM | POA: Diagnosis not present

## 2023-07-02 DIAGNOSIS — S065X0A Traumatic subdural hemorrhage without loss of consciousness, initial encounter: Secondary | ICD-10-CM | POA: Insufficient documentation

## 2023-07-02 DIAGNOSIS — M25512 Pain in left shoulder: Secondary | ICD-10-CM

## 2023-07-02 DIAGNOSIS — S0990XA Unspecified injury of head, initial encounter: Secondary | ICD-10-CM | POA: Diagnosis present

## 2023-07-02 DIAGNOSIS — S065XAA Traumatic subdural hemorrhage with loss of consciousness status unknown, initial encounter: Secondary | ICD-10-CM

## 2023-07-02 LAB — BASIC METABOLIC PANEL
Anion gap: 11 (ref 5–15)
BUN: 10 mg/dL (ref 8–23)
CO2: 24 mmol/L (ref 22–32)
Calcium: 8.8 mg/dL — ABNORMAL LOW (ref 8.9–10.3)
Chloride: 105 mmol/L (ref 98–111)
Creatinine, Ser: 0.76 mg/dL (ref 0.44–1.00)
GFR, Estimated: >60 mL/min (ref >60)
Glucose, Bld: 103 mg/dL — ABNORMAL HIGH (ref 70–99)
Potassium: 4.3 mmol/L (ref 3.5–5.1)
Sodium: 140 mmol/L (ref 135–145)

## 2023-07-02 LAB — CBC WITH DIFFERENTIAL/PLATELET
Abs Immature Granulocytes: 0.02 10*3/uL (ref 0.00–0.07)
Basophils Absolute: 0 10*3/uL (ref 0.0–0.1)
Basophils Relative: 1 %
Eosinophils Absolute: 0.1 10*3/uL (ref 0.0–0.5)
Eosinophils Relative: 2 %
HCT: 38.9 % (ref 36.0–46.0)
Hemoglobin: 12.9 g/dL (ref 12.0–15.0)
Immature Granulocytes: 0 %
Lymphocytes Relative: 18 %
Lymphs Abs: 1.2 10*3/uL (ref 0.7–4.0)
MCH: 32.7 pg (ref 26.0–34.0)
MCHC: 33.2 g/dL (ref 30.0–36.0)
MCV: 98.7 fL (ref 80.0–100.0)
Monocytes Absolute: 0.6 10*3/uL (ref 0.1–1.0)
Monocytes Relative: 9 %
Neutro Abs: 4.4 10*3/uL (ref 1.7–7.7)
Neutrophils Relative %: 70 %
Platelets: 171 10*3/uL (ref 150–400)
RBC: 3.94 MIL/uL (ref 3.87–5.11)
RDW: 12.9 % (ref 11.5–15.5)
WBC: 6.3 10*3/uL (ref 4.0–10.5)
nRBC: 0 % (ref 0.0–0.2)

## 2023-07-02 MED ORDER — IOHEXOL 350 MG/ML SOLN
75.0000 mL | Freq: Once | INTRAVENOUS | Status: AC | PRN
  Administered 2023-07-02: 75 mL via INTRAVENOUS

## 2023-07-02 MED ORDER — IOHEXOL 350 MG/ML SOLN: 75.0000 mL | Freq: Once | INTRAVENOUS | Status: DC | PRN

## 2023-07-02 MED ORDER — LABETALOL HCL 5 MG/ML IV SOLN
5.0000 mg | Freq: Once | INTRAVENOUS | Status: AC
  Administered 2023-07-02: 5 mg via INTRAVENOUS
  Filled 2023-07-02: qty 4

## 2023-07-02 NOTE — ED Notes (Signed)
Pt's BP is slightly elevated per bleed control policy.  Message sent to doctor regarding BP meds (labetalol/hydralazine). Was not sure if we were waiting for [place]'s CT to come back first.

## 2023-07-02 NOTE — ED Notes (Signed)
Went to start and IV and pull labs.  Pt was in XR

## 2023-07-02 NOTE — ED Triage Notes (Signed)
Patient arrives ambulatory by POV states she saw PCP today and sent for a CT scan of head after having a fall yesterday. Was called with results and told to come here. CT findings include- Acute left cerebral convexity subdural hemorrhage measuring up to 8 mm along the left frontoparietal region with thinner component extending anteriorly and posteriorly.   Patient states she is feeling "big headed and things aren't very clear." No blood thinners. Patient has bruising to left eye.

## 2023-07-02 NOTE — ED Notes (Signed)
Pt placed on bedpan

## 2023-07-02 NOTE — Discharge Instructions (Addendum)
It was a pleasure taking care of you here in the emergency department today  As discussed in the room you do have a small brain bleed called a subdural hematoma.  This is unchanged from imaging earlier today.    I discussed this with neurosurgeon who is recommending follow-up outpatient.  Call the number listed in your discharge paperwork.  Please call them tomorrow to let them know you are seen in the emergency department for a brain bleed and you need to follow-up with him.  Return for new or worsening symptoms such as severe headache, vision changes, numbness, weakness, vomiting, confusion

## 2023-07-02 NOTE — ED Provider Notes (Addendum)
[place] EMERGENCY DEPARTMENT AT [facility] Provider Note   CSN: [id number] Arrival date & time: 07/02/23  1730    History  Chief Complaint  Patient presents with   Head Injury    Abnormal CT head    Megan Porter is a 82 y.o. female here for evaluation mechanical fall.  Fell yesterday outside.  Landed on her left side.  She has an bruising to her face as well as abrasions to her left knee and shoulder.  She was seen by PCP who scheduled a CT scan outpatient.  She was told she had a head bleed need to come here to the emergency department.  She denies any headache, numbness, weakness, chest pain, shortness of breath, abdominal pain.  Has been ambulatory since the fall.  She denies any anticoagulation.  She did states that she was "out of it and down on the ground" for 15 minutes after the fall. No anticoagulation, Plavix, ASA  HPI     Home Medications Prior to Admission medications   Medication Sig Start Date End Date Taking? Authorizing Provider  Biotin 5 MG CAPS Take 5 mg by mouth daily.   Yes [provider]  calcium-vitamin D (OSCAL WITH D) 500-200 MG-UNIT TABS tablet Take 1 tablet by mouth daily.   Yes [provider]  Cholecalciferol (VITAMIN D) 50 MCG (2000 UT) CAPS Take 2,000 Units by mouth daily.    Yes [provider]  citalopram (CELEXA) 40 MG tablet TAKE 1 TABLET BY MOUTH  DAILY Patient taking differently: Take 40 mg by mouth daily. 03/18/19  Yes Hurst, Teresa T, PA-C  diphenhydrAMINE-APAP, sleep, (TYLENOL PM EXTRA STRENGTH PO) Take 1-2 tablets by mouth daily as needed (pain).   Yes [provider]  hydroxychloroquine (PLAQUENIL) 200 MG tablet TAKE 1 TABLET BY MOUTH DAILY 08/16/22  Yes Gearldine Bienenstock, PA-C  levothyroxine (SYNTHROID) 75 MCG tablet Take 75 mcg by mouth daily.   Yes [provider]  Multiple Vitamin (MULTIVITAMIN) capsule Take 1 capsule by mouth daily.   Yes [provider]  mupirocin  ointment (BACTROBAN) 2 % Apply 1 Application topically 2 (two) times daily. 07/01/23  Yes [provider]  SODIUM FLUORIDE, DENTAL RINSE, (PREVIDENT) 0.2 % SOLN Take 1 Application by mouth daily as needed.   Yes [provider]      Allergies    Patient has no known allergies.    Review of Systems   Review of Systems  Constitutional: Negative.   HENT: Negative.    Respiratory: Negative.    Cardiovascular: Negative.   Gastrointestinal: Negative.   Genitourinary: Negative.   Musculoskeletal:  Negative for arthralgias, back pain, gait problem, joint swelling, myalgias, neck pain and neck stiffness.       Bruising left anterior shoulder, left lateral knee  Skin:  Positive for wound.  Neurological: Negative.   All other systems reviewed and are negative.   Physical Exam Updated Vital Signs BP (!) 152/83   Pulse 73   Temp 98.1 F (36.7 C) (Oral)   Resp 19   Ht 5\' 2"  (1.575 m)   Wt 55.8 kg   SpO2 95%   BMI 22.50 kg/m  Physical Exam Vitals and nursing note reviewed.  Constitutional:      General: She is not in acute distress.    Appearance: She is well-developed. She is not ill-appearing, toxic-appearing or diaphoretic.  HENT:     Head: Normocephalic.     Comments: Left periorbital ecchymosis, no  lacerations to suture    Nose: Nose normal.     Mouth/Throat:     Mouth: Mucous membranes are moist.     Comments: No loose teeth, tongue midline Eyes:     Pupils: Pupils are equal, round, and reactive to light.  Neck:     Vascular: No carotid bruit.     Comments: No midline cervical tenderness, full range of motion without difficulty Cardiovascular:     Rate and Rhythm: Normal rate.     Pulses: Normal pulses.          Radial pulses are 2+ on the right side and 2+ on the left side.       Dorsalis pedis pulses are 2+ on the right side and 2+ on the left side.     Heart sounds: Normal heart sounds.  Pulmonary:     Effort: Pulmonary effort is normal. No  respiratory distress.     Breath sounds: Normal breath sounds.     Comments: Clear bilaterally, speaks in full sentences without difficulty Chest:     Comments: Soft, nontender, focal 1 cm area of ecchymosis left lateral chest wall, no crepitus or step-off Abdominal:     General: Bowel sounds are normal. There is no distension.     Palpations: Abdomen is soft. There is no mass.     Tenderness: There is no abdominal tenderness. There is no right CVA tenderness, left CVA tenderness, guarding or rebound.     Hernia: No hernia is present.     Comments: Soft, nontender, no rebound or guarding.  No abdominal wall ecchymosis/bruising  Musculoskeletal:        General: Normal range of motion.     Cervical back: Normal range of motion and neck supple. No rigidity or tenderness.     Comments: No midline C/T/L tenderness.  Bruising and abrasion to left anterior shoulder.  Lifts arms overhead without difficulty.  Nontender midshaft, distal humerus bilaterally, forearm, hand.  Pelvis stable, nontender to palpation.  No shortening or rotation of legs.  Lifts bilateral legs off bed without difficulty.  Bruising and abrasion to left lateral knee.  Able to flex and extend without difficulty.  Can straight leg raise without difficulty  Lymphadenopathy:     Cervical: No cervical adenopathy.  Skin:    General: Skin is warm and dry.     Capillary Refill: Capillary refill takes less than 2 seconds.     Comments: Various contusions, abrasions, no lacerations to suture  Neurological:     General: No focal deficit present.     Mental Status: She is alert.     Cranial Nerves: Cranial nerves 2-12 are intact.     Sensory: Sensation is intact.     Motor: Motor function is intact.     Gait: Gait is intact.     Comments: CN 2-12 grossly intact Ambulatory Equal strength BIL Intact sensation  Psychiatric:        Mood and Affect: Mood normal.    ED Results / Procedures / Treatments   Labs (all labs ordered are  listed, but only abnormal results are displayed) Labs Reviewed  BASIC METABOLIC PANEL - Abnormal; Notable for the following components:      Result Value   Glucose, Bld 103 (*)    Calcium 8.8 (*)    All other components within normal limits  CBC WITH DIFFERENTIAL/PLATELET    EKG None  Radiology DG Shoulder Left  Result Date: 07/02/2023 CLINICAL DATA:  Fall, pain EXAM:  LEFT SHOULDER - 2+ VIEW COMPARISON:  None Available. FINDINGS: Degenerative changes in the [address] joint with joint space narrowing and spurring. Glenohumeral joint is maintained. No acute bony abnormality. Specifically, no fracture, subluxation, or dislocation. Soft tissues are intact. IMPRESSION: Degenerative changes in the left AC joint. No acute bony abnormality. Electronically Signed   By: Charlett Nose M.D.   On: 07/02/2023 21:33   DG Knee Complete 4 Views Left  Result Date: 07/02/2023 CLINICAL DATA:  Fall, knee pain EXAM: LEFT KNEE - COMPLETE 4+ VIEW COMPARISON:  None Available. FINDINGS: Chondrocalcinosis. Mild degenerative changes most pronounced in the medial compartment with joint space narrowing and spurring. No joint effusion. No acute bony abnormality. Specifically, no fracture, subluxation, or dislocation. IMPRESSION: No acute bony abnormality. Electronically Signed   By: Charlett Nose M.D.   On: 07/02/2023 21:33   DG Ribs Unilateral W/Chest Left  Result Date: 07/02/2023 CLINICAL DATA:  Fall EXAM: LEFT RIBS AND CHEST - 3+ VIEW COMPARISON:  Chest x-ray 04/03/2023 FINDINGS: No fracture or other bone lesions are seen involving the ribs. There is no evidence of pneumothorax or pleural effusion. Chronic interstitial opacities in the mid and lower lungs appear similar to prior study. There is no new focal lung infiltrate, pleural effusion or pneumothorax. The cardiomediastinal silhouette is within normal limits. IMPRESSION: 1. No evidence of displaced rib fracture or pneumothorax. 2. Stable chronic interstitial opacities.  Electronically Signed   By: Darliss Cheney M.D.   On: 07/02/2023 21:32   CT ANGIO HEAD NECK W WO CM  Result Date: 07/02/2023 CLINICAL DATA:  Fall, facial trauma EXAM: CT ANGIOGRAPHY HEAD AND NECK WITH AND WITHOUT CONTRAST TECHNIQUE: Multidetector CT imaging of the head and neck was performed using the standard protocol during bolus administration of intravenous contrast. Multiplanar CT image reconstructions and MIPs were obtained to evaluate the vascular anatomy. Carotid stenosis measurements (when applicable) are obtained utilizing NASCET criteria, using the distal internal carotid diameter as the denominator. RADIATION DOSE REDUCTION: This exam was performed according to the departmental dose-optimization program which includes automated exposure control, adjustment of the mA and/or kV according to patient size and/or use of iterative reconstruction technique. CONTRAST:  75mL OMNIPAQUE IOHEXOL 350 MG/ML SOLN COMPARISON:  None Available. FINDINGS: CT HEAD FINDINGS Brain: Left cerebral convexity hyperdense subdural hematoma, which measures up to 8 mm (series 15, image 30). Mild mass effect on the underlying frontal and temporal lobes. No significant midline shift. No evidence of acute infarct, mass, or hydrocephalus. Partial empty sella. Normal craniocervical junction. Vascular: No hyperdense vessel. There is an area of increased attenuation about the right supraclinoid ICA, with calcifications (series 13, image 15). Skull: Negative for fracture or focal lesion. Prior right pterional craniectomy. Sinuses/Orbits: No acute finding. Other: The mastoid air cells are well aerated. CTA NECK FINDINGS Aortic arch: Standard branching. Imaged portion shows no evidence of aneurysm or dissection. No significant stenosis of the major arch vessel origins. Right carotid system: No evidence of dissection, occlusion, or hemodynamically significant stenosis (greater than 50%). Left carotid system: No evidence of dissection,  occlusion, or hemodynamically significant stenosis (greater than 50%). Vertebral arteries: No evidence of dissection, occlusion, or hemodynamically significant stenosis (greater than 50%). Skeleton: No acute osseous abnormality. Degenerative changes in the cervical spine. Other neck: 6 mm hyperenhancing nodule in the left thyroid lobe, for which no follow-up is currently indicated. (Reference: J Am Coll Radiol. 2015 Feb;12(2): 143-50) Upper chest: Redemonstrated bronchial wall thickening, bronchiectasis, and small pulmonary nodules, which appear similar to slightly  improved compared to the 12/10/2022 exam. No pleural effusion. Review of the MIP images confirms the above findings CTA HEAD FINDINGS Anterior circulation: Both internal carotid arteries are patent to the termini, without significant stenosis. The right supraclinoid ICA passes through an area of slightly increased density (series 5, image 100), which correlates with the possibly calcified area on the head CT, which could represent a calcified aneurysm. No significant irregularity is noted in right supraclinoid ICA wall in this area. No definite enhancement outside the right supraclinoid ICA. A1 segments patent. Normal anterior communicating artery. Anterior cerebral arteries are patent to their distal aspects without significant stenosis. No M1 stenosis or occlusion. MCA branches perfused to their distal aspects without significant stenosis. Posterior circulation: Vertebral arteries patent to the vertebrobasilar junction without significant stenosis. The left vertebral artery primarily supplies the left PICA. Basilar patent to its distal aspect without significant stenosis. Superior cerebellar arteries patent proximally. Patent P1 segments. PCAs perfused to their distal aspects without significant stenosis. The right posterior communicating artery is patent. Venous sinuses: As permitted by contrast timing, patent. Anatomic variants: None significant.  Aneurysm or vascular malformation. No evidence of active extravasation into the left cerebral convexity subdural hemorrhage. Review of the MIP images confirms the above findings IMPRESSION: 1. Acute left cerebral convexity subdural hematoma, measuring up to 8 mm, with mild mass effect on the underlying frontal and temporal lobes but no significant midline shift. 2. The right supraclinoid ICA passes through an area of slightly increased density, which correlates with a possibly calcified area on the head CT, which could represent a calcified aneurysm. No significant irregularity is noted in the right supraclinoid ICA wall in this area, with no definite enhancement outside the right supraclinoid ICA in the suspected aneurysm sac. This is somewhat atypical, and could also represent a meningioma. This could be further evaluated with MRI with and without contrast if clinically indicated. 3. No hemodynamically significant stenosis in the neck. 4. Redemonstrated bronchial wall thickening, bronchiectasis, and small pulmonary nodules, which appear similar to slightly improved compared to the 12/10/2022 exam, which remain concerning for an atypical infection, such as MAI. These results were called by telephone at the time of interpretation on 07/02/2023 at 9:17 pm to provider [facility] , who verbally acknowledged these results. Electronically Signed   By: Wiliam Ke M.D.   On: 07/02/2023 21:19   Imaging Results - CT Head WO Contrast (07/02/2023 3:36 PM EDT) Impressions  07/02/2023 4:54 PM EDT   1.  Acute left cerebral convexity subdural hemorrhage measuring up to 8 mm. Mild local mass effect but no substantial ventricular effacement or midline shift. 2.  Abnormal partially calcified focus along the right anterior clinoid process closely associated with the supraclinoid ICA measuring approximately 12 x 7 mm with primary differential considerations including aneurysm and dural based mass such as a meningioma.  Further evaluation with CTA or MRA is recommended to evaluate for aneurysm. Depending upon the results, MRI with and without contrast could further evaluate for mass.  These findings were discussed with Dani Gobble by Dr. Mauro Kaufmann via telephone on 07/02/2023 4:04 PM.    Procedures .Critical Care  Performed by: Linwood Dibbles, PA-C Authorized by: Linwood Dibbles, PA-C   Critical care provider statement:    Critical care time (minutes):  35   Critical care was necessary to treat or prevent imminent or life-threatening deterioration of the following conditions:  CNS failure or compromise (subdural hematoma)   Critical care was time spent personally  by me on the following activities:  Development of treatment plan with patient or surrogate, discussions with consultants, evaluation of patient's response to treatment, examination of patient, ordering and review of laboratory studies, ordering and review of radiographic studies, ordering and performing treatments and interventions, pulse oximetry, re-evaluation of patient's condition and review of old charts     Medications Ordered in ED Medications  iohexol (OMNIPAQUE) 350 MG/ML injection 75 mL (75 mLs Intravenous Contrast Given 07/02/23 2033)  labetalol (NORMODYNE) injection 5 mg (5 mg Intravenous Given 07/02/23 2106)    ED Course/ Medical Decision Making/ A&P Clinical Course as of 07/02/23 2218  Tue Jul 02, 2023  2203 Dr. Conchita Paris with Neurosurgery. Rec FU outpatient. Does not need MRI in ED [BH]    Clinical Course User Index [BH] Biruk Troia A, PA-C   82 year old not anticoagulated here for evaluation mechanical fall which occurred yesterday.  Seen by PCP today who ordered a head CT in the outpatient setting.  She was called due to a subdural hemorrhage found on the CT scan and possible aneurysm versus mass.  They recommend follow-up here.  She has a nonfocal neuroexam without deficits.  She does have left periorbital  ecchymosis however no obvious ocular injury.  She denies any vision changes.  She has no midline C/T/L tenderness.  There is bruising and contusion to her left anterior shoulder and left lateral knee.  She has 1 cm area of bruising to her left lateral ribs however denies any pain, no crepitus or step-off.  She does not want anything for pain. Ambulatory since the fall. Will plan on labs, imaging and reassess.  Labs and imaging personally viewed and interpreted:  CBC without leukocytosis BMP metabolic panel without significant abnormality CTA head/neck 8mm subdural, unclear if thrombosed aneurysm versus meningioma Xray left ribs no acute abnormality Xray left knee no acute abnormality Xray left shoulder no acute abnormality  Patient reassessed.  Continues have a nonfocal neuroexam without deficits.  Will touch base with neurosurgery  Discussed with Dr. Conchita Paris who is personally looked at patient's imaging.  He recommends discharge home, can follow-up outpatient in office, patient does not need emergent MRI here in the emergency department at this time.  At this time I low suspicion for occult fracture, dislocation, septic joint, gout, hemarthrosis, acute thoracic or intra-abdominal traumatic injury.  She has no midline C/T/L tenderness.  Patient reassessed.  She now has a daughter present in room.  We discussed her labs and imaging.  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  Unclear if this is related, they are unsure exactly what this area was had they state has been "many many years."  I discussed return precaution with patient and daughter in room.  Will have her follow-up outpatient with neurosurgery.  She has someone at home 24/7 with her.  Ambulatory here, continues to be neurovascularly intact without deficits.  The patient has been appropriately  medically screened and/or stabilized in the ED. I have low suspicion for any other emergent medical condition which would require further screening, evaluation or treatment in the ED or require inpatient management.  Patient is hemodynamically stable and in no acute distress.  Patient able to ambulate in department prior to ED.  Evaluation does not show acute pathology that would require ongoing or additional emergent interventions while in the emergency department or  further inpatient treatment.  I have discussed the diagnosis with the patient and answered all questions.  Pain is been managed while in the emergency department and patient has no further complaints prior to discharge.  Patient is comfortable with plan discussed in room and is stable for discharge at this time.  I have discussed strict return precautions for returning to the emergency department.  Patient was encouraged to follow-up with PCP/specialist refer to at discharge.                                Medical Decision Making Amount and/or Complexity of Data Reviewed Independent Historian: spouse    Details: Husband, daughter in room External Data Reviewed: labs, radiology and notes. Labs: ordered. Decision-making details documented in ED Course. Radiology: ordered and independent interpretation performed. Decision-making details documented in ED Course.  Risk OTC drugs. Prescription drug management. Parenteral controlled substances. Decision regarding hospitalization. Diagnosis or treatment significantly limited by social determinants of health.          Final Clinical Impression(s) / ED Diagnoses Final diagnoses:  Fall, initial encounter  Abrasion of left knee, initial encounter  Subdural hematoma (HCC)  Abrasion of left shoulder, initial encounter  Acute pain of left shoulder    Rx / DC Orders ED Discharge Orders     None         Kaari Zeigler A, PA-C 07/02/23 2218    Nevaya Nagele A,  PA-C 07/02/23 2218    Melene Plan, DO 07/02/23 2221

## 2023-09-12 NOTE — Progress Notes (Signed)
 Office Visit Note  Patient: Megan Porter             Date of Birth: Mar 21, 1941           MRN: [id number]             PCP: Tisovec, Richard W, MD Referring: Tisovec, Richard W, MD Visit Date: 09/24/2023 Occupation: @GUAROCC @  Subjective:  Medication monitoring   History of Present Illness: ARBIE Porter is a 82 y.o. female with history of seronegative rheumatoid arthritis and osteoarthritis. Patient remains on  Plaquenil  200 mg 1 tablet by mouth daily.  She continues to tolerate Plaquenil  without any side effects and has not had any recent gaps in therapy.  Patient denies any signs or symptoms of a rheumatoid arthritis flare.  She has not had any joint swelling or nocturnal pain.  Patient experiences some mechanical symptoms in her left knee from a previous injury.  Has had a few falls recently.  She is not currently using a cane or walker. She denies having to take any over-the-counter products for pain relief. She denies any recent or recurrent infections.  She denies any new medical conditions.   Activities of Daily Living:  Patient reports morning stiffness for 1 hour.   Patient Denies nocturnal pain.  Difficulty dressing/grooming: Denies Difficulty climbing stairs: Reports Difficulty getting out of chair: Denies Difficulty using hands for taps, buttons, cutlery, and/or writing: Reports  Review of Systems  Constitutional:  Positive for fatigue.  HENT:  Positive for mouth dryness. Negative for mouth sores.   Eyes:  Negative for dryness.  Respiratory:  Negative for shortness of breath.   Cardiovascular:  Negative for chest pain and palpitations.  Gastrointestinal:  Positive for constipation. Negative for blood in stool and diarrhea.  Endocrine: Negative for increased urination.  Genitourinary:  Negative for involuntary urination.  Musculoskeletal:  Positive for gait problem and morning stiffness. Negative for joint pain, joint pain, joint swelling, myalgias, muscle weakness,  muscle tenderness and myalgias.  Skin:  Negative for color change, rash, hair loss and sensitivity to sunlight.  Allergic/Immunologic: Negative for susceptible to infections.  Neurological:  Negative for dizziness and headaches.  Hematological:  Negative for swollen glands.  Psychiatric/Behavioral:  Positive for sleep disturbance. Negative for depressed mood. The patient is not nervous/anxious.     PMFS History:  Patient Active Problem List   Diagnosis Date Noted   Abnormal CT scan of lung    Hypokalemia 09/21/2018   Benign essential HTN 09/21/2018   Leucocytosis 09/21/2018   Pneumonia 09/21/2018   Bronchiectasis without complication (HCC) 05/21/2017   Primary osteoarthritis of both knees 04/06/2017   DDD (degenerative disc disease), lumbar history of lumbar surgery 1970 04/06/2017   Age-related osteoporosis without current pathological fracture 04/06/2017   History of anxiety 04/06/2017   History of breast cancer 04/04/2017   Primary osteoarthritis of both feet 10/02/2016   Primary osteoarthritis of both hands 10/02/2016   High risk medications (not anticoagulants) long-term use 10/02/2016   Rheumatoid arthritis, seronegative, multiple sites (HCC) 10/02/2016    Past Medical History:  Diagnosis Date   Arthritis    Cancer (HCC)    Osteoarthritis     Family History  Family history unknown: Yes   Past Surgical History:  Procedure Laterality Date   BACK SURGERY     BREAST RECONSTRUCTION     knee arthroscopic     VIDEO BRONCHOSCOPY Bilateral 07/06/2019   Procedure: VIDEO BRONCHOSCOPY WITHOUT FLUORO;  Surgeon: Mannam, Praveen, MD;  Location: [facility];  Service: Cardiopulmonary;  Laterality: Bilateral;   Social History   Social History Narrative   Not on file   Immunization History  Administered Date(s) Administered   Fluad Quad(high Dose 65+) 05/29/2019   Influenza-Unspecified 07/06/2017, 07/24/2018   PFIZER(Purple Top)SARS-COV-2 Vaccination 10/07/2019, 10/28/2019,  06/28/2020, 06/05/2021   Pneumococcal Conjugate-13 04/13/2014   Pneumococcal Polysaccharide-23 10/13/2018   Td (Adult),5 Lf Tetanus Toxid, Preservative Free 08/19/2021     Objective: Vital Signs: BP (!) 160/96 (BP Location: Left Arm, Patient Position: Sitting, Cuff Size: Normal)   Pulse 70   Resp 12   Ht 5' 3 (1.6 m)   Wt 124 lb (56.2 kg)   BMI 21.97 kg/m    Physical Exam Vitals and nursing note reviewed.  Constitutional:      Appearance: She is well-developed.  HENT:     Head: Normocephalic and atraumatic.  Eyes:     Conjunctiva/sclera: Conjunctivae normal.  Cardiovascular:     Rate and Rhythm: Normal rate and regular rhythm.     Heart sounds: Normal heart sounds.  Pulmonary:     Effort: Pulmonary effort is normal.     Breath sounds: Normal breath sounds.  Abdominal:     General: Bowel sounds are normal.     Palpations: Abdomen is soft.  Musculoskeletal:     Cervical back: Normal range of motion.  Lymphadenopathy:     Cervical: No cervical adenopathy.  Skin:    General: Skin is warm and dry.     Capillary Refill: Capillary refill takes less than 2 seconds.  Neurological:     Mental Status: She is alert and oriented to person, place, and time.  Psychiatric:        Behavior: Behavior normal.      Musculoskeletal Exam: C-spine has limited range of motion with lateral Tatian.  No midline spinal tenderness.  No SI joint tenderness.  Shoulder joints, elbow joints, and wrist joints have good range of motion.  CMC, PIP, and DIP thickening consistent with osteoarthritis of both hands.  No tenderness or synovitis of MCP joints.  MCP joint thickening noted.  Right hip has limited range of motion.  Left hip has good range of motion with no groin pain.  Knee joints have good range of motion with some discomfort in the left knee with full flexion.  Ankle joints have good range of motion with no tenderness or joint swelling.  No tenderness of MTP joints.  CDAI Exam: CDAI Score:  -- Patient Global: --; Provider Global: -- Swollen: --; Tender: -- Joint Exam 09/24/2023   No joint exam has been documented for this visit   There is currently no information documented on the homunculus. Go to the Rheumatology activity and complete the homunculus joint exam.  Investigation: No additional findings.  Imaging: No results found.  Recent Labs: Lab Results  Component Value Date   WBC 6.3 07/02/2023   HGB 12.9 07/02/2023   PLT 171 07/02/2023   NA 140 07/02/2023   K 4.3 07/02/2023   CL 105 07/02/2023   CO2 24 07/02/2023   GLUCOSE 103 (H) 07/02/2023   BUN 10 07/02/2023   CREATININE 0.76 07/02/2023   BILITOT 0.4 04/24/2023   ALKPHOS 70 09/25/2018   AST 15 04/24/2023   ALT 13 04/24/2023   PROT 6.8 04/24/2023   ALBUMIN 1.8 (L) 09/25/2018   CALCIUM  8.8 (L) 07/02/2023   GFRAA 78 01/04/2021    Speciality Comments: PLQ Eye Exam 01/10/2023 WNL  @ [facility] Follow  up in 1 year    Procedures:  No procedures performed Allergies: Patient has no known allergies.   Assessment / Plan:     Visit Diagnoses: Rheumatoid arthritis, seronegative, multiple sites (HCC) - Erosive disease, neg RF,CCP, and ANA: She has no joint tenderness or synovitis on examination today.  She has not had any signs or symptoms of a rheumatoid arthritis flare.  She has clinically been doing well taking Plaquenil  200 mg 1 tablet by mouth daily.  She is tolerating Plaquenil  without any side effects and has not had any recent gaps in therapy.  She has not been experiencing any nocturnal pain or joint inflammation.  Her rheumatoid arthritis remains well-controlled on Plaquenil  as monotherapy.  No medication changes will be made at this time.  ESR was elevated 43 on 04/24/23-plan to recheck ESR today.   She was advised to notify us  if she develops signs or symptoms of a flare.  She will follow-up in the office in 5 months or sooner if needed.- Plan: Sedimentation rate  High risk medications  (not anticoagulants) long-term use - Plaquenil  200 mg 1 tablet by mouth daily. D/c MTX in April 2020 due to recurrent infections.  CBC and BMP updated on 07/02/23.  Orders for CBC and CMP were released today. PLQ Eye Exam 01/10/2023 WNL @ [facility] Follow up in 1 year  - Plan: CBC with Differential/Platelet, COMPLETE METABOLIC PANEL WITH GFR  Elevated sed rate -ESR was 43 on 04/24/2023.  Plan to recheck sed rate today.  Plan: Sedimentation rate  Primary osteoarthritis of both hands: CMC, PIP, DIP thickening consistent with osteoarthritis of both hands.  No active inflammation noted.  S/P left knee arthroscopy - Performed by Dr. Melodi.  Intermittent discomfort in the left knee especially with full flexion.  Primary osteoarthritis of both hips: She has slightly limited range of motion of both hips, right worse than left.  No groin pain currently.  Primary osteoarthritis of both knees: Patient experiences intermittent discomfort in the left knee from a previous injury.  She previously underwent arthroscopic surgery for the left knee.  She has intermittent swelling depending on her activity level.  No effusion was noted on examination today.  Primary osteoarthritis of both feet: She is not experiencing any discomfort in her feet at this time.  She is good range of motion of both ankle joints with no joint tenderness or joint swelling.  No tenderness over MTP joints.  Degeneration of intervertebral disc of lumbar region without discogenic back pain or lower extremity pain: She is not experiencing any increased discomfort in her lower back at this time.  No midline spinal tenderness.  No SI joint tenderness.  No symptoms of radiculopathy.  Age-related osteoporosis without current pathological fracture - DEXA ordered and followed by PCP.  No longer taking fosamax.  Patient was treated with Evista  and Fosamax in the past.  She has been taking a calcium  and vitamin D  supplement  daily.  Vitamin D  deficiency: Vitamin D  was within normal limits: 43 on 04/24/2023.  She is taking vitamin D  2000 units daily.  Other fatigue: Chronic, stable.   Other medical conditions are listed as follows:  Other closed fracture of distal end of right radius with routine healing, subsequent encounter - Evaluated by Dr. Carolee in February 2022.  Poor balance: She has had a few falls recently.  Discussed the importance of lower extremity muscle strengthening.  Muscular deconditioning: Patient has had a few falls recently.  Discussed the importance of  lower extremity muscle strengthening.  She is not using a cane or walker at this time.  History of anxiety  History of breast cancer  Bronchiectasis without complication (HCC)    Orders: Orders Placed This Encounter  Procedures   CBC with Differential/Platelet   COMPLETE METABOLIC PANEL WITH GFR   Sedimentation rate   No orders of the defined types were placed in this encounter.    Follow-Up Instructions: Return in about 5 months (around 02/22/2024) for Rheumatoid arthritis, Osteoarthritis.   Waddell CHRISTELLA Craze, PA-C  Note - This record has been created using Dragon software.  Chart creation errors have been sought, but may not always  have been located. Such creation errors do not reflect on  the standard of medical care.

## 2023-09-24 ENCOUNTER — Encounter: Payer: Self-pay | Admitting: Physician Assistant

## 2023-09-24 ENCOUNTER — Ambulatory Visit: Payer: Medicare Other | Attending: Physician Assistant | Admitting: Physician Assistant

## 2023-09-24 VITALS — BP 160/96 | HR 70 | Resp 12 | Ht 63.0 in | Wt 124.0 lb

## 2023-09-24 DIAGNOSIS — R700 Elevated erythrocyte sedimentation rate: Secondary | ICD-10-CM

## 2023-09-24 DIAGNOSIS — M19041 Primary osteoarthritis, right hand: Secondary | ICD-10-CM | POA: Diagnosis not present

## 2023-09-24 DIAGNOSIS — E559 Vitamin D deficiency, unspecified: Secondary | ICD-10-CM

## 2023-09-24 DIAGNOSIS — M170 Bilateral primary osteoarthritis of knee: Secondary | ICD-10-CM

## 2023-09-24 DIAGNOSIS — Z79899 Other long term (current) drug therapy: Secondary | ICD-10-CM | POA: Diagnosis not present

## 2023-09-24 DIAGNOSIS — Z8659 Personal history of other mental and behavioral disorders: Secondary | ICD-10-CM

## 2023-09-24 DIAGNOSIS — R29898 Other symptoms and signs involving the musculoskeletal system: Secondary | ICD-10-CM

## 2023-09-24 DIAGNOSIS — Z9889 Other specified postprocedural states: Secondary | ICD-10-CM | POA: Diagnosis not present

## 2023-09-24 DIAGNOSIS — Z853 Personal history of malignant neoplasm of breast: Secondary | ICD-10-CM

## 2023-09-24 DIAGNOSIS — S52591D Other fractures of lower end of right radius, subsequent encounter for closed fracture with routine healing: Secondary | ICD-10-CM

## 2023-09-24 DIAGNOSIS — M0609 Rheumatoid arthritis without rheumatoid factor, multiple sites: Secondary | ICD-10-CM

## 2023-09-24 DIAGNOSIS — M19072 Primary osteoarthritis, left ankle and foot: Secondary | ICD-10-CM

## 2023-09-24 DIAGNOSIS — M160 Bilateral primary osteoarthritis of hip: Secondary | ICD-10-CM

## 2023-09-24 DIAGNOSIS — J479 Bronchiectasis, uncomplicated: Secondary | ICD-10-CM

## 2023-09-24 DIAGNOSIS — R2689 Other abnormalities of gait and mobility: Secondary | ICD-10-CM

## 2023-09-24 DIAGNOSIS — R5383 Other fatigue: Secondary | ICD-10-CM

## 2023-09-24 DIAGNOSIS — M810 Age-related osteoporosis without current pathological fracture: Secondary | ICD-10-CM

## 2023-09-24 DIAGNOSIS — M19042 Primary osteoarthritis, left hand: Secondary | ICD-10-CM

## 2023-09-24 DIAGNOSIS — M51369 Other intervertebral disc degeneration, lumbar region without mention of lumbar back pain or lower extremity pain: Secondary | ICD-10-CM

## 2023-09-24 DIAGNOSIS — M19071 Primary osteoarthritis, right ankle and foot: Secondary | ICD-10-CM

## 2023-09-25 LAB — COMPLETE METABOLIC PANEL WITH GFR
AG Ratio: 1.7 (calc) (ref 1.0–2.5)
ALT: 15 U/L (ref 6–29)
AST: 18 U/L (ref 10–35)
Albumin: 4.3 g/dL (ref 3.6–5.1)
Alkaline phosphatase (APISO): 55 U/L (ref 37–153)
BUN: 15 mg/dL (ref 7–25)
CO2: 31 mmol/L (ref 20–32)
Calcium: 9.1 mg/dL (ref 8.6–10.4)
Chloride: 103 mmol/L (ref 98–110)
Creat: 0.87 mg/dL (ref 0.60–0.95)
Globulin: 2.6 g/dL (ref 1.9–3.7)
Glucose, Bld: 80 mg/dL (ref 65–99)
Potassium: 4.5 mmol/L (ref 3.5–5.3)
Sodium: 142 mmol/L (ref 135–146)
Total Bilirubin: 0.5 mg/dL (ref 0.2–1.2)
Total Protein: 6.9 g/dL (ref 6.1–8.1)
eGFR: 66 mL/min/{1.73_m2} (ref >=60)

## 2023-09-25 LAB — CBC WITH DIFFERENTIAL/PLATELET
Absolute Lymphocytes: 1182 {cells}/uL (ref 850–3900)
Absolute Monocytes: 350 {cells}/uL (ref 200–950)
Basophils Absolute: 48 {cells}/uL (ref 0–200)
Basophils Relative: 0.9 %
Eosinophils Absolute: 90 {cells}/uL (ref 15–500)
Eosinophils Relative: 1.7 %
HCT: 41.9 % (ref 35.0–45.0)
Hemoglobin: 14 g/dL (ref 11.7–15.5)
MCH: 32.9 pg (ref 27.0–33.0)
MCHC: 33.4 g/dL (ref 32.0–36.0)
MCV: 98.6 fL (ref 80.0–100.0)
MPV: 10.6 fL (ref 7.5–12.5)
Monocytes Relative: 6.6 %
Neutro Abs: 3631 {cells}/uL (ref 1500–7800)
Neutrophils Relative %: 68.5 %
Platelets: 209 10*3/uL (ref 140–400)
RBC: 4.25 10*6/uL (ref 3.80–5.10)
RDW: 12.6 % (ref 11.0–15.0)
Total Lymphocyte: 22.3 %
WBC: 5.3 10*3/uL (ref 3.8–10.8)

## 2023-09-25 LAB — SEDIMENTATION RATE: Sed Rate: 14 mm/h (ref 0–30)

## 2023-09-25 NOTE — Progress Notes (Signed)
CBC and CMP WNL.  ESR WNL.

## 2023-10-09 ENCOUNTER — Encounter: Payer: Self-pay | Admitting: Pulmonary Disease

## 2023-10-09 ENCOUNTER — Ambulatory Visit: Payer: Medicare Other | Admitting: Pulmonary Disease

## 2023-10-09 VITALS — BP 152/85 | HR 87 | Ht 62.0 in | Wt 124.8 lb

## 2023-10-09 DIAGNOSIS — J479 Bronchiectasis, uncomplicated: Secondary | ICD-10-CM | POA: Diagnosis not present

## 2023-10-09 NOTE — Patient Instructions (Signed)
VISIT SUMMARY:  You were seen today for shortness of breath. We discussed your history of bronchiectasis and rheumatoid arthritis, and reviewed your current symptoms and treatment plans.  YOUR PLAN:  -BRONCHIECTASIS: Bronchiectasis is a condition where the airways in the lungs become damaged and widened, leading to mucus build-up and infections. Your condition has been stable since 2018, and although you report shortness of breath, there is no excessive mucus production. We will schedule a follow-up scan in 6 months to monitor the progression of your bronchiectasis and any potential impact from your rheumatoid arthritis.  -RHEUMATOID ARTHRITIS: Rheumatoid arthritis is an autoimmune disease that causes joint inflammation and pain. You are currently managing this condition with Plaquenil under the care of Dr. Corliss Skains. Continue with your current treatment plan and follow up with Dr. Corliss Skains as needed.  INSTRUCTIONS:  Please schedule a follow-up scan in 6 months to assess the progression of your bronchiectasis. Continue your current treatment for rheumatoid arthritis and follow up with Dr. Corliss Skains as needed.

## 2023-10-09 NOTE — Progress Notes (Signed)
Megan Porter    [id number]    05-Jun-1941  Primary Care Physician:Tisovec, Adelfa Koh, MD  Referring Physician: Gaspar Garbe, MD [address] [place],  [place] [address]  Chief complaint:   Follow-up for bronchiectasis, pneumonia   HPI: Mrs. Haydel is a 83 year old with rheumatoid arthritis, cancer.She was hospitalized in May 2018 at [facility] for respiratory failure with multilobar infiltrates on CT scan. Flu test, Legionella urine test was negative.She was treated with a prednisone taper and Levaquin. She apparently had drug reaction with hives to Levaquin and it was changed to a different antibiotics. Since her return to [facility] she's had a follow-up CT scan earlier this month which showed resolution of the infiltrates.There is some mild bronchiectasis with mucus plugging in the right middle lobe. She has been referred to pulmonary for further evaluation.  Hospitalized in early January 2020 for acute hypoxic respiratory failure secondary to pneumonia with chest x-ray showing right upper lobe infiltrate. Work-up including urine pneumococcus, respiratory virus panel and blood cultures were negative.   She has history of rheumatoid arthritis and is followed by Dr. Corliss Skains. She was being maintained on methotrexate and Plaquenil.  Since her hospitalization she was taken off methotrexate  Pets:No pets. She used to have a dog. She does not have any birds, exotic pets or exposure to farm animals Occupation:Homemaker Exposures:No known exposures. No mold issues at home. Smoking history:Never smoker  Interim history: Discussed the use of AI scribe software for clinical note transcription with the patient, who gave verbal consent to proceed.  The patient, with a history of bronchiectasis and rheumatoid arthritis, presents with shortness of breath. She reports that she tires easily and sometimes feels as though her breath is being taken for her. She has had pneumonia  multiple times in the past, but is not currently on any medications or treatments for her bronchiectasis, including Symbicort, flutter wall, or percussion vest. She has not been producing much mucus.  For her rheumatoid arthritis, she is currently on Plaquenil, having stopped methotrexate in 2020. She continues to see a rheumatologist for this condition.   Outpatient Encounter Medications as of 10/09/2023  Medication Sig   Biotin 5 MG CAPS Take 5 mg by mouth daily.   calcium-vitamin D (OSCAL WITH D) 500-200 MG-UNIT TABS tablet Take 1 tablet by mouth daily.   Cholecalciferol (VITAMIN D) 50 MCG (2000 UT) CAPS Take 2,000 Units by mouth daily.    citalopram (CELEXA) 40 MG tablet TAKE 1 TABLET BY MOUTH  DAILY (Patient taking differently: Take 40 mg by mouth daily.)   diphenhydrAMINE-APAP, sleep, (TYLENOL PM EXTRA STRENGTH PO) Take 1-2 tablets by mouth daily as needed (pain).   hydroxychloroquine (PLAQUENIL) 200 MG tablet TAKE 1 TABLET BY MOUTH DAILY   levothyroxine (SYNTHROID) 75 MCG tablet Take 75 mcg by mouth daily.   Multiple Vitamin (MULTIVITAMIN) capsule Take 1 capsule by mouth daily.   SODIUM FLUORIDE, DENTAL RINSE, (PREVIDENT) 0.2 % SOLN Take 1 Application by mouth daily as needed.   mupirocin ointment (BACTROBAN) 2 % Apply 1 Application topically 2 (two) times daily. (Patient not taking: Reported on 09/24/2023)   No facility-administered encounter medications on file as of 10/09/2023.   Physical Exam: Blood pressure (!) 152/85, pulse 87, height 5\' 2"  (1.575 m), weight 124 lb 12.8 oz (56.6 kg), SpO2 96%. Gen:      No acute distress HEENT:  EOMI, sclera anicteric Neck:     No masses; no thyromegaly Lungs:  Clear to auscultation bilaterally; normal respiratory effort CV:         Regular rate and rhythm; no murmurs Abd:      + bowel sounds; soft, non-tender; no palpable masses, no distension Ext:    No edema; adequate peripheral perfusion Skin:      Warm and dry; no rash Neuro: alert and  oriented x 3 Psych: normal mood and affect   Data Reviewed: Imaging: DATA from [facility] Chest x-ray 12/16/16- left upper lobe consolidation, right mid lung consolidation CT chest 12/18/16- Small to moderate bilateral pleural effusion, dense alveolar infiltrate throughout the left upper lobe, right middle lobe and left lower lobe with underlying nodularity. Mediastinal lymphadenopathy.  CT scan 04/23/17-  Mild bronchiectasis and mucous plugging in the right middle lobe, lingula. No lymphadenopathy.  Chest x-ray 09/21/2018- right upper lobe airspace opacity. Chest x-ray 09/25/2018- progressive right upper lobe and left upper lobe infiltrates. High-resolution CT chest 02/25/2019- nodularity, consolidation and bronchiectasis in the lingula which is worsened since 2018.  New nodularity in the left lower lobe.  Bronchial wall thickening and scattered bronchial plugging throughout the lungs.  No evidence of pulmonary fibrosis  High resolution CT 12/10/2022-patchy areas of bronchial thickening, mild bronchiectasis, clustered nodularity I have reviewed the images personally.  PFTs  08/06/17 FVC 1.82 [73%], FEV1 1.32 [71%], F/F 73, TLC 67%, DLCO 70% Mild obstruction, moderate restriction, mild diffusion defect  02/28/2023 FVC 1.38 [62%], FEV1 1.10 [68%], F/F80, TLC 4.07 [85%], DLCO 13.79 [79%] Mild obstruction  Labs: BAL 07/06/2019 Cell count-nucleated cells 990, 98% neutrophils 0% eos Cell count nucleated cells 36, 80% monocyte macrophage, 0% eos AFB, fungus, cultures-negative  Assessment:  Bronchiectasis Stable since 2018.  Bronchoscopy in 2020 with negative cultures No current use of Symbicort, flutter valve, or percussion vest. Reports shortness of breath but no excessive mucus production. Lung function test in June/July shows obstruction consistent with bronchiectasis, but lung capacity and oxygen exchange are satisfactory.  -Plan for a follow-up scan in 6 months to assess bronchiectasis  progression and potential impact of rheumatoid arthritis on the lungs.  Rheumatoid Arthritis Managed by Dr. Corliss Skains. Currently on Plaquenil, with methotrexate discontinued in 2020. -Continue current management with Dr. Corliss Skains and Plaquenil.    Health maintenance 05/29/2019-flu vaccine 04/13/2014-Prevnar 10/13/2018-Pneumovax  Plan/Recommendations: Follow-up CT chest  Chilton Greathouse MD [facility] 10/09/2023, 3:44 PM  CC: Tisovec, Adelfa Koh, MD

## 2023-12-10 ENCOUNTER — Other Ambulatory Visit: Payer: Self-pay | Admitting: Physician Assistant

## 2023-12-10 DIAGNOSIS — Z79899 Other long term (current) drug therapy: Secondary | ICD-10-CM

## 2023-12-10 DIAGNOSIS — M0609 Rheumatoid arthritis without rheumatoid factor, multiple sites: Secondary | ICD-10-CM

## 2023-12-10 NOTE — Telephone Encounter (Signed)
 Last Fill: 08/16/2022  Eye exam: 01/10/2023 WNL    Labs: 09/24/2023 CBC and CMP WNL ESR WNL  Next Visit: 03/03/2024  Last Visit: 09/24/2023  DX: Rheumatoid arthritis, seronegative, multiple sites   Current Dose per office note 09/24/2023: Plaquenil 200 mg 1 tablet by mouth daily   Okay to refill Plaquenil?

## 2023-12-30 ENCOUNTER — Emergency Department (HOSPITAL_COMMUNITY)
Admission: EM | Admit: 2023-12-30 | Discharge: 2023-12-31 | Discharge status: Against Medical Advice | Attending: Emergency Medicine | Admitting: Emergency Medicine

## 2023-12-30 ENCOUNTER — Other Ambulatory Visit: Payer: Self-pay

## 2023-12-30 DIAGNOSIS — S8991XA Unspecified injury of right lower leg, initial encounter: Secondary | ICD-10-CM | POA: Diagnosis present

## 2023-12-30 DIAGNOSIS — S81812A Laceration without foreign body, left lower leg, initial encounter: Secondary | ICD-10-CM | POA: Diagnosis present

## 2023-12-30 DIAGNOSIS — W100XXA Fall (on)(from) escalator, initial encounter: Secondary | ICD-10-CM | POA: Insufficient documentation

## 2023-12-30 DIAGNOSIS — S81811A Laceration without foreign body, right lower leg, initial encounter: Secondary | ICD-10-CM | POA: Diagnosis not present

## 2023-12-30 DIAGNOSIS — Z5321 Procedure and treatment not carried out due to patient leaving prior to being seen by health care provider: Secondary | ICD-10-CM | POA: Insufficient documentation

## 2023-12-30 NOTE — ED Triage Notes (Signed)
 Pt c/o skin tear on R shin, and bruising on L shin. Pt was on escalator when husband fell on them.   Not on blood thinners

## 2023-12-30 NOTE — ED Notes (Signed)
 Pt decided to leave

## 2023-12-31 ENCOUNTER — Emergency Department (HOSPITAL_COMMUNITY)
Admission: EM | Admit: 2023-12-31 | Discharge: 2023-12-31 | Disposition: A | Discharge status: Home / Self Care | Source: Home / Self Care

## 2023-12-31 ENCOUNTER — Encounter (HOSPITAL_COMMUNITY): Payer: Self-pay

## 2023-12-31 DIAGNOSIS — W100XXA Fall (on)(from) escalator, initial encounter: Secondary | ICD-10-CM | POA: Insufficient documentation

## 2023-12-31 DIAGNOSIS — S81819A Laceration without foreign body, unspecified lower leg, initial encounter: Secondary | ICD-10-CM

## 2023-12-31 DIAGNOSIS — S81812A Laceration without foreign body, left lower leg, initial encounter: Secondary | ICD-10-CM | POA: Insufficient documentation

## 2023-12-31 NOTE — ED Triage Notes (Addendum)
 Fell on escalator, injury to L leg. Was at [facility] for 6 hrs but left.  Daughter had them call EMS to come to [facility]. Husband is patient as well

## 2023-12-31 NOTE — ED Provider Notes (Signed)
 [place] EMERGENCY DEPARTMENT AT [facility] Provider Note   CSN: [id number] Arrival date & time: 12/31/23  0940     History  Chief Complaint  Patient presents with   Coleridge Davenport    EUREKA VALDES is a 83 y.o. female.  HPI Patient fell down the escalator yesterday at [facility]  She was standing behind her husband who actually fell first and caused her to fall.  She got an abrasion to her anterior lower leg on the left.  EMS came and applied a dressing to her wound.  They then went to [facility] and had waited in the waiting room for 10 hours and nobody was getting seen so they went home and returned this morning for evaluation.  Patient reports that she really only needs her dressing changed.    Home Medications Prior to Admission medications   Medication Sig Start Date End Date Taking? Authorizing Provider  Biotin 5 MG CAPS Take 5 mg by mouth daily.    [provider]  calcium-vitamin D (OSCAL WITH D) 500-200 MG-UNIT TABS tablet Take 1 tablet by mouth daily.    [provider]  Cholecalciferol (VITAMIN D) 50 MCG (2000 UT) CAPS Take 2,000 Units by mouth daily.     [provider]  citalopram (CELEXA) 40 MG tablet TAKE 1 TABLET BY MOUTH  DAILY Patient taking differently: Take 40 mg by mouth daily. 03/18/19   Marvia Slocumb T, PA-C  diphenhydrAMINE-APAP, sleep, (TYLENOL PM EXTRA STRENGTH PO) Take 1-2 tablets by mouth daily as needed (pain).    [provider]  hydroxychloroquine (PLAQUENIL) 200 MG tablet TAKE 1 TABLET BY MOUTH DAILY 12/10/23   Nicholas Bari, MD  levothyroxine (SYNTHROID) 75 MCG tablet Take 75 mcg by mouth daily.    [provider]  Multiple Vitamin (MULTIVITAMIN) capsule Take 1 capsule by mouth daily.    [provider]  mupirocin ointment (BACTROBAN) 2 % Apply 1 Application topically 2 (two) times daily. Patient not taking: Reported on 09/24/2023 07/01/23   [provider]  SODIUM FLUORIDE, DENTAL  RINSE, (PREVIDENT) 0.2 % SOLN Take 1 Application by mouth daily as needed.    [provider]      Allergies    Patient has no known allergies.    Review of Systems   Review of Systems  Physical Exam Updated Vital Signs BP (!) 162/83   Pulse 81   Temp 98.4 F (36.9 C) (Oral)   Resp 16   SpO2 98%  Physical Exam Constitutional:      Comments: Alert and well in appearance.  HENT:     Head: Normocephalic and atraumatic.  Pulmonary:     Effort: Pulmonary effort is normal.  Musculoskeletal:     Comments: Patient has chronic venous stasis changes left lower leg.  Patient has a approximately 5 cm skin tear to the right shin.  Wound is clean.  Skin:    General: Skin is warm and dry.  Neurological:     General: No focal deficit present.     Mental Status: She is oriented to person, place, and time.     Motor: No weakness.     Coordination: Coordination normal.  Psychiatric:        Mood and Affect: Mood normal.     ED Results / Procedures / Treatments   Labs (all labs ordered are listed, but only abnormal results are displayed) Labs Reviewed - No data to display  EKG None  Radiology No results  found.  Procedures Procedures    Medications Ordered in ED Medications - No data to display  ED Course/ Medical Decision Making/ A&P                                 Medical Decision Making  Patient has lower extremity skin tear to the right leg.  Patient has skin changes consistent with chronic venous stasis that is stable with friable skin.  Patient is ambulatory without difficulty.  No evidence of fracture or other significant injury.  Wound is dressed with Xeroform and bulky gauze.  Recommendation is to leave Xeroform in place for the next 3 to 4 days with follow-up with PCP for wound change.        Final Clinical Impression(s) / ED Diagnoses Final diagnoses:  Skin tear of lower leg without complication, initial encounter    Rx / DC Orders ED  Discharge Orders     None         Wynetta Heckle, MD 12/31/23 1146

## 2023-12-31 NOTE — Discharge Instructions (Addendum)
 1.  You have a skin tear.  These heal over time.  Leave the Xeroform dressing in place for 3 to 4 days and follow-up with your doctor.  You may change the outer dressing as needed if it is dirty or has drainage on it.  Leave the yellow gauze in place that is covering the wound.  They can do a dressing change for you.  Try to elevate your wound is much as possible.  Protect it from additional injury.  Skin tears do take a while to heal.  Continue to observe for any signs of infection.

## 2024-01-01 ENCOUNTER — Telehealth: Payer: Self-pay | Admitting: Internal Medicine

## 2024-01-01 NOTE — Telephone Encounter (Signed)
 Will sch and call pt     Copied from CRM [id number]. Topic: General - Other >> Dec 31, 2023  1:59 PM Eveleen Hinds B wrote: Reason for CRM: Patient would like a call back to schedule her CT Chest High Resolution.

## 2024-02-12 ENCOUNTER — Other Ambulatory Visit: Payer: Self-pay | Admitting: Rheumatology

## 2024-02-12 DIAGNOSIS — M0609 Rheumatoid arthritis without rheumatoid factor, multiple sites: Secondary | ICD-10-CM

## 2024-02-12 DIAGNOSIS — Z79899 Other long term (current) drug therapy: Secondary | ICD-10-CM

## 2024-02-12 MED ORDER — HYDROXYCHLOROQUINE SULFATE 200 MG PO TABS: 200.0000 mg | ORAL_TABLET | Freq: Every day | ORAL | Status: DC

## 2024-02-12 NOTE — Telephone Encounter (Signed)
Please advise patient to get repeat eye examination.

## 2024-02-12 NOTE — Telephone Encounter (Signed)
 Last Fill: 12/10/2023  Eye exam: 01/10/2023 WNL   Patient contacted and advised we needed a updated eye exam.   Labs: 09/24/2023 CBC and CMP WNL   Next Visit: 03/03/2024  Last Visit: 09/24/2023  AO:[id number] arthritis, seronegative, multiple sites   Current Dose per office note 09/24/2023: Plaquenil  200 mg 1 tablet by mouth daily.   Okay to refill Plaquenil ?

## 2024-02-12 NOTE — Telephone Encounter (Signed)
 Prescription was sent in with 3 refills, contacted [facility] and cancelled the refills.

## 2024-02-19 ENCOUNTER — Other Ambulatory Visit (HOSPITAL_COMMUNITY): Payer: Self-pay | Admitting: *Deleted

## 2024-02-19 ENCOUNTER — Telehealth: Payer: Self-pay

## 2024-02-19 NOTE — Telephone Encounter (Signed)
 Auth Submission: APPROVED Site of care: Site of care: [facility] Payer: [employer] medicare Medication & CPT/J Code(s) submitted: Prolia  (Denosumab ) [id number] Route of submission (phone, fax, portal):  Phone # Fax # Auth type: Buy/Bill PB Units/visits requested: 60mg  x 2 doses Reference number: [id number] Approval from: 06/12/23 to 06/11/24

## 2024-02-20 ENCOUNTER — Ambulatory Visit (HOSPITAL_COMMUNITY)
Admission: RE | Admit: 2024-02-20 | Discharge: 2024-02-20 | Disposition: A | Discharge status: Home / Self Care | Source: Ambulatory Visit | Attending: Internal Medicine | Admitting: Internal Medicine

## 2024-02-20 DIAGNOSIS — M810 Age-related osteoporosis without current pathological fracture: Secondary | ICD-10-CM | POA: Insufficient documentation

## 2024-02-20 MED ORDER — DENOSUMAB 60 MG/ML ~~LOC~~ SOSY
PREFILLED_SYRINGE | SUBCUTANEOUS | Status: AC
  Filled 2024-02-20: qty 1

## 2024-02-20 MED ORDER — DENOSUMAB 60 MG/ML ~~LOC~~ SOSY
60.0000 mg | PREFILLED_SYRINGE | Freq: Once | SUBCUTANEOUS | Status: AC
  Administered 2024-02-20: 60 mg via SUBCUTANEOUS

## 2024-03-03 ENCOUNTER — Ambulatory Visit: Payer: Medicare Other | Admitting: Rheumatology

## 2024-03-24 ENCOUNTER — Ambulatory Visit
Admission: RE | Admit: 2024-03-24 | Discharge: 2024-03-24 | Disposition: A | Discharge status: Home / Self Care | Source: Ambulatory Visit | Attending: Pulmonary Disease

## 2024-03-24 DIAGNOSIS — J479 Bronchiectasis, uncomplicated: Secondary | ICD-10-CM

## 2024-03-30 ENCOUNTER — Telehealth: Payer: Self-pay

## 2024-03-30 NOTE — Telephone Encounter (Signed)
 Copied from CRM [id number]. Topic: Clinical - Lab/Test Results >> Mar 30, 2024  4:00 PM Chantha C wrote: Reason for CRM: Patient wants CT chest results, does not or want to use Mychart, patient wants all results to call patient. Please advise and call back [phone].  Dr Theophilus, please advise CT results.

## 2024-03-31 ENCOUNTER — Ambulatory Visit: Payer: Self-pay | Admitting: Pulmonary Disease

## 2024-03-31 NOTE — Telephone Encounter (Signed)
 I called the patient several times but there was no answer. Left message to call bacl

## 2024-04-06 NOTE — Telephone Encounter (Signed)
 Called and spoke to patient. She is requesting a call from Dr. Theophilus to discuss CT.   Dr. Theophilus, please advise. Thanks

## 2024-04-13 NOTE — Telephone Encounter (Signed)
 I was finally able to reach the patient.  Discussed her CT scan which shows increase in nodular opacities suspicious for MAI.  She previously underwent a bronchoscopy with negative cultures.  Symptomatically she is doing well with no cough or sputum production.  Please make a routine follow-up visit at next available time with me to discuss face-to-face options for continued surveillance versus repeat bronchoscopy

## 2024-04-14 NOTE — Telephone Encounter (Signed)
 Lm for patient.

## 2024-04-16 NOTE — Telephone Encounter (Signed)
 Spoke to patient and scheduled appt 06/11/2024 at 11:15. Nothing further is needed.

## 2024-04-22 NOTE — Progress Notes (Signed)
 Office Visit Note  Patient: Megan Porter             Date of Birth: 1941-02-15           MRN: [id number]             PCP: Tisovec, Richard W, MD Referring: Vernadine Charlie ORN, MD Visit Date: 05/05/2024 Occupation: @GUAROCC @  Subjective:  Medication monitoring  History of Present Illness: Megan Porter is a 83 y.o. female with seronegative rheumatoid arthritis and osteoarthritis.  She has not had a flare of rheumatoid arthritis.  She returns today after her last visit in January 2025.  She is on Plaquenil  200 mg p.o. daily which she has been tolerating well.  Patient states that she has been getting cortisone and Visco supplement injections with Dr.Alusio.  She states this time for her to get repeat injection.  She has been having some stiffness in her left knee joint.  She also notices poor balance and decreased strength in her lower extremities.    Activities of Daily Living:  Patient reports morning stiffness for 2-3 hours.   Patient Denies nocturnal pain.  Difficulty dressing/grooming: Denies Difficulty climbing stairs: Reports Difficulty getting out of chair: Denies Difficulty using hands for taps, buttons, cutlery, and/or writing: Reports  Review of Systems  Constitutional:  Positive for fatigue.  HENT:  Positive for mouth dryness. Negative for mouth sores.   Eyes:  Negative for dryness.  Respiratory:  Positive for shortness of breath.   Cardiovascular:  Negative for chest pain and palpitations.  Gastrointestinal:  Positive for constipation. Negative for blood in stool and diarrhea.  Endocrine: Negative for increased urination.  Genitourinary:  Negative for involuntary urination.  Musculoskeletal:  Positive for gait problem and morning stiffness. Negative for joint pain, joint pain, joint swelling, myalgias, muscle weakness, muscle tenderness and myalgias.  Skin:  Negative for color change, rash, hair loss and sensitivity to sunlight.  Allergic/Immunologic: Negative for  susceptible to infections.  Neurological:  Negative for dizziness and headaches.  Hematological:  Negative for swollen glands.  Psychiatric/Behavioral:  Positive for sleep disturbance. Negative for depressed mood. The patient is nervous/anxious.     PMFS History:  Patient Active Problem List   Diagnosis Date Noted   Abnormal CT scan of lung    Hypokalemia 09/21/2018   Benign essential HTN 09/21/2018   Leucocytosis 09/21/2018   Pneumonia 09/21/2018   Bronchiectasis without complication (HCC) 05/21/2017   Primary osteoarthritis of both knees 04/06/2017   DDD (degenerative disc disease), lumbar history of lumbar surgery 1970 04/06/2017   Age-related osteoporosis without current pathological fracture 04/06/2017   History of anxiety 04/06/2017   History of breast cancer 04/04/2017   Primary osteoarthritis of both feet 10/02/2016   Primary osteoarthritis of both hands 10/02/2016   High risk medications (not anticoagulants) long-term use 10/02/2016   Rheumatoid arthritis, seronegative, multiple sites (HCC) 10/02/2016    Past Medical History:  Diagnosis Date   Arthritis    Cancer (HCC)    Hypertension    Osteoarthritis     Family History  Family history unknown: Yes   Past Surgical History:  Procedure Laterality Date   BACK SURGERY     BREAST RECONSTRUCTION     knee arthroscopic     VIDEO BRONCHOSCOPY Bilateral 07/06/2019   Procedure: VIDEO BRONCHOSCOPY WITHOUT FLUORO;  Surgeon: Mannam, Praveen, MD;  Location: [facility];  Service: Cardiopulmonary;  Laterality: Bilateral;   Social History   Social History Narrative   Not on  file   Immunization History  Administered Date(s) Administered   Fluad Quad(high Dose 65+) 05/29/2019   Influenza-Unspecified 07/06/2017, 07/24/2018   PFIZER(Purple Top)SARS-COV-2 Vaccination 10/07/2019, 10/28/2019, 06/28/2020, 06/05/2021   Pneumococcal Conjugate-13 04/13/2014   Pneumococcal Polysaccharide-23 10/13/2018   Td (Adult),5 Lf Tetanus  Toxid, Preservative Free 08/19/2021     Objective: Vital Signs: BP 120/70 (BP Location: Left Arm, Patient Position: Sitting, Cuff Size: Small)   Pulse 80   Resp 12   Ht 5' 3 (1.6 m)   Wt 120 lb (54.4 kg)   BMI 21.26 kg/m    Physical Exam Vitals and nursing note reviewed.  Constitutional:      Appearance: She is well-developed.  HENT:     Head: Normocephalic and atraumatic.  Eyes:     Conjunctiva/sclera: Conjunctivae normal.  Cardiovascular:     Rate and Rhythm: Normal rate and regular rhythm.     Heart sounds: Normal heart sounds.  Pulmonary:     Effort: Pulmonary effort is normal.     Breath sounds: Normal breath sounds.  Abdominal:     General: Bowel sounds are normal.     Palpations: Abdomen is soft.  Musculoskeletal:     Cervical back: Normal range of motion.  Lymphadenopathy:     Cervical: No cervical adenopathy.  Skin:    General: Skin is warm and dry.     Capillary Refill: Capillary refill takes less than 2 seconds.  Neurological:     Mental Status: She is alert and oriented to person, place, and time.  Psychiatric:        Behavior: Behavior normal.      Musculoskeletal Exam: Patient had limited range of motion of the cervical spine without any discomfort.  She had no tenderness over thoracic or lumbar spine.  Thoracic kyphosis was noted.  Shoulders, elbows, wrist joints with good range of motion.  Bilateral CMC, PIP and DIP thickening with no synovitis was noted.  She had limited range of motion of her hip joints with no discomfort.  Knee joints in good range of motion.  There was no tenderness over her ankles or MTPs.  CDAI Exam: CDAI Score: -- Patient Global: 0 / 100; Provider Global: 0 / 100 Swollen: --; Tender: -- Joint Exam 05/05/2024   No joint exam has been documented for this visit   There is currently no information documented on the homunculus. Go to the Rheumatology activity and complete the homunculus joint exam.  Investigation: No  additional findings.  Imaging: No results found.   Recent Labs: Lab Results  Component Value Date   WBC 5.3 09/24/2023   HGB 14.0 09/24/2023   PLT 209 09/24/2023   NA 142 09/24/2023   K 4.5 09/24/2023   CL 103 09/24/2023   CO2 31 09/24/2023   GLUCOSE 80 09/24/2023   BUN 15 09/24/2023   CREATININE 0.87 09/24/2023   BILITOT 0.5 09/24/2023   ALKPHOS 70 09/25/2018   AST 18 09/24/2023   ALT 15 09/24/2023   PROT 6.9 09/24/2023   ALBUMIN 1.8 (L) 09/25/2018   CALCIUM  9.1 09/24/2023   GFRAA 78 01/04/2021    Speciality Comments: PLQ Eye Exam 03/25/2024 WNL  @  Ophthalmology Follow up in 1 year   Procedures:  No procedures performed Allergies: Patient has no known allergies.   Assessment / Plan:     Visit Diagnoses: Rheumatoid arthritis, seronegative, multiple sites (HCC) - Erosive disease, neg RF,CCP, and ANA: Patient had no synovitis on the examination.  She has been doing  well on hydroxychloroquine .  She denies any side effects from hydroxychloroquine .  High risk medications (not anticoagulants) long-term use - Plaquenil  200 mg 1 tablet by mouth daily September 24, 2023 CBC and CMP were normal.. D/c MTX in April 2020 due to recurrent infections. PLQ Eye Exam 03/25/2024  Elevated sed rate-repeat sed rate was normal.  Primary osteoarthritis of both hands-she had bilateral PIP and DIP thickening.  No synovitis was noted.  S/P left knee arthroscopy - Performed by Dr. Melodi.  Primary osteoarthritis of both hips-regular limited range of motion of the hip joints.  Primary osteoarthritis of both knees-she had no warmth swelling or effusion in the knee joints.  Primary osteoarthritis of both feet-no synovitis was noted.  Degeneration of intervertebral disc of lumbar region without discogenic back pain or lower extremity pain-she denies any discomfort today.  Age-related osteoporosis without current pathological fracture -  No longer taking fosamax.  Patient was treated with  Evista  and Fosamax in the past.  Patient states she gets DEXA scan through her PCP.  I do not have results available.  Vitamin D  deficiency-she has been taking vitamin D  on a regular basis.  Other fatigue  Other closed fracture of distal end of right radius with routine healing, subsequent encounter - Evaluated by Dr. Carolee in February 2022.  Poor balance-she continues to have poor balance.  I will refer her to physical therapy.  Muscular deconditioning-I will refer her to physical therapy.  History of anxiety  History of breast cancer  Bronchiectasis without complication (HCC)  Orders: Orders Placed This Encounter  Procedures   CBC with Differential/Platelet   Comprehensive metabolic panel with GFR   Lipid panel   Ambulatory referral to Physical Therapy   No orders of the defined types were placed in this encounter.   Follow-Up Instructions: Return in about 5 months (around 10/05/2024) for Rheumatoid arthritis.   Maya Nash, MD  Note - This record has been created using Animal nutritionist.  Chart creation errors have been sought, but may not always  have been located. Such creation errors do not reflect on  the standard of medical care.

## 2024-05-05 ENCOUNTER — Ambulatory Visit: Attending: Rheumatology | Admitting: Rheumatology

## 2024-05-05 ENCOUNTER — Encounter: Payer: Self-pay | Admitting: Rheumatology

## 2024-05-05 VITALS — BP 120/70 | HR 80 | Resp 12 | Ht 63.0 in | Wt 120.0 lb

## 2024-05-05 DIAGNOSIS — R700 Elevated erythrocyte sedimentation rate: Secondary | ICD-10-CM | POA: Diagnosis not present

## 2024-05-05 DIAGNOSIS — Z9889 Other specified postprocedural states: Secondary | ICD-10-CM

## 2024-05-05 DIAGNOSIS — M19072 Primary osteoarthritis, left ankle and foot: Secondary | ICD-10-CM

## 2024-05-05 DIAGNOSIS — M51369 Other intervertebral disc degeneration, lumbar region without mention of lumbar back pain or lower extremity pain: Secondary | ICD-10-CM

## 2024-05-05 DIAGNOSIS — Z79899 Other long term (current) drug therapy: Secondary | ICD-10-CM | POA: Diagnosis not present

## 2024-05-05 DIAGNOSIS — R2689 Other abnormalities of gait and mobility: Secondary | ICD-10-CM

## 2024-05-05 DIAGNOSIS — M0609 Rheumatoid arthritis without rheumatoid factor, multiple sites: Secondary | ICD-10-CM | POA: Diagnosis not present

## 2024-05-05 DIAGNOSIS — S52591D Other fractures of lower end of right radius, subsequent encounter for closed fracture with routine healing: Secondary | ICD-10-CM

## 2024-05-05 DIAGNOSIS — Z853 Personal history of malignant neoplasm of breast: Secondary | ICD-10-CM

## 2024-05-05 DIAGNOSIS — M170 Bilateral primary osteoarthritis of knee: Secondary | ICD-10-CM

## 2024-05-05 DIAGNOSIS — Z8659 Personal history of other mental and behavioral disorders: Secondary | ICD-10-CM

## 2024-05-05 DIAGNOSIS — E785 Hyperlipidemia, unspecified: Secondary | ICD-10-CM

## 2024-05-05 DIAGNOSIS — E559 Vitamin D deficiency, unspecified: Secondary | ICD-10-CM

## 2024-05-05 DIAGNOSIS — R5383 Other fatigue: Secondary | ICD-10-CM

## 2024-05-05 DIAGNOSIS — J479 Bronchiectasis, uncomplicated: Secondary | ICD-10-CM

## 2024-05-05 DIAGNOSIS — M19041 Primary osteoarthritis, right hand: Secondary | ICD-10-CM

## 2024-05-05 DIAGNOSIS — R29898 Other symptoms and signs involving the musculoskeletal system: Secondary | ICD-10-CM

## 2024-05-05 DIAGNOSIS — M810 Age-related osteoporosis without current pathological fracture: Secondary | ICD-10-CM

## 2024-05-05 DIAGNOSIS — M19042 Primary osteoarthritis, left hand: Secondary | ICD-10-CM

## 2024-05-05 DIAGNOSIS — M19071 Primary osteoarthritis, right ankle and foot: Secondary | ICD-10-CM

## 2024-05-05 DIAGNOSIS — M160 Bilateral primary osteoarthritis of hip: Secondary | ICD-10-CM

## 2024-05-05 LAB — COMPREHENSIVE METABOLIC PANEL WITH GFR
AG Ratio: 1.5 (calc) (ref 1.0–2.5)
ALT: 14 U/L (ref 6–29)
AST: 14 U/L (ref 10–35)
Albumin: 3.9 g/dL (ref 3.6–5.1)
Alkaline phosphatase (APISO): 65 U/L (ref 37–153)
BUN/Creatinine Ratio: 20 (calc) (ref 6–22)
BUN: 19 mg/dL (ref 7–25)
CO2: 31 mmol/L (ref 20–32)
Calcium: 8.9 mg/dL (ref 8.6–10.4)
Chloride: 100 mmol/L (ref 98–110)
Creat: 0.96 mg/dL — ABNORMAL HIGH (ref 0.60–0.95)
Globulin: 2.6 g/dL (ref 1.9–3.7)
Glucose, Bld: 140 mg/dL — ABNORMAL HIGH (ref 65–99)
Potassium: 3.9 mmol/L (ref 3.5–5.3)
Sodium: 138 mmol/L (ref 135–146)
Total Bilirubin: 0.2 mg/dL (ref 0.2–1.2)
Total Protein: 6.5 g/dL (ref 6.1–8.1)
eGFR: 58 mL/min/1.73m2 — ABNORMAL LOW (ref >=60)

## 2024-05-05 LAB — CBC WITH DIFFERENTIAL/PLATELET
Absolute Lymphocytes: 937 {cells}/uL (ref 850–3900)
Absolute Monocytes: 568 {cells}/uL (ref 200–950)
Basophils Absolute: 52 {cells}/uL (ref 0–200)
Basophils Relative: 0.6 %
Eosinophils Absolute: 120 {cells}/uL (ref 15–500)
Eosinophils Relative: 1.4 %
HCT: 37.3 % (ref 35.0–45.0)
Hemoglobin: 12.4 g/dL (ref 11.7–15.5)
MCH: 33 pg (ref 27.0–33.0)
MCHC: 33.2 g/dL (ref 32.0–36.0)
MCV: 99.2 fL (ref 80.0–100.0)
MPV: 9.7 fL (ref 7.5–12.5)
Monocytes Relative: 6.6 %
Neutro Abs: 6923 {cells}/uL (ref 1500–7800)
Neutrophils Relative %: 80.5 %
Platelets: 236 Thousand/uL (ref 140–400)
RBC: 3.76 Million/uL — ABNORMAL LOW (ref 3.80–5.10)
RDW: 13 % (ref 11.0–15.0)
Total Lymphocyte: 10.9 %
WBC: 8.6 Thousand/uL (ref 3.8–10.8)

## 2024-05-05 LAB — LIPID PANEL
Cholesterol: 203 mg/dL — ABNORMAL HIGH (ref <200)
HDL: 90 mg/dL (ref >=50)
LDL Cholesterol (Calc): 94 mg/dL
Non-HDL Cholesterol (Calc): 113 mg/dL (ref <130)
Total CHOL/HDL Ratio: 2.3 (calc) (ref <5.0)
Triglycerides: 94 mg/dL (ref <150)

## 2024-05-05 NOTE — Patient Instructions (Signed)
 Vaccines You are taking a medication(s) that can suppress your immune system.  The following immunizations are recommended: Flu annually Covid-19  Td/Tdap (tetanus, diphtheria, pertussis) every 10 years Pneumonia (Prevnar 15 then Pneumovax 23 at least 1 year apart.  Alternatively, can take Prevnar 20 without needing additional dose) Shingrix: 2 doses from 4 weeks to 6 months apart  Please check with your PCP to make sure you are up to date.

## 2024-05-06 ENCOUNTER — Ambulatory Visit: Payer: Self-pay | Admitting: Rheumatology

## 2024-05-06 NOTE — Progress Notes (Signed)
 CBC and CMP stable, glucose is elevated, probably not a fasting sample, total cholesterol is elevated.  Please forward results to PCP.

## 2024-06-11 ENCOUNTER — Ambulatory Visit: Admitting: Pulmonary Disease

## 2024-06-15 ENCOUNTER — Encounter: Payer: Self-pay | Admitting: Pulmonary Disease

## 2024-06-15 ENCOUNTER — Ambulatory Visit: Admitting: Pulmonary Disease

## 2024-06-15 VITALS — BP 122/78 | HR 78 | Temp 98.0°F | Ht 63.0 in | Wt 119.0 lb

## 2024-06-15 DIAGNOSIS — R053 Chronic cough: Secondary | ICD-10-CM | POA: Diagnosis not present

## 2024-06-15 DIAGNOSIS — J479 Bronchiectasis, uncomplicated: Secondary | ICD-10-CM | POA: Diagnosis not present

## 2024-06-15 NOTE — Patient Instructions (Signed)
  VISIT SUMMARY: You had a follow-up visit to discuss your chronic lung inflammation and persistent cough. We reviewed your symptoms and current treatments, and provided recommendations to help manage your condition.  YOUR PLAN: SUSPECTED CHRONIC PULMONARY MYCOBACTERIAL INFECTION: Your CT scan showed slight worsening of lung inflammation, which may indicate a chronic pulmonary mycobacterial infection. This condition is usually not dangerous but can have periods of worsening and improvement. -Monitor for any worsening symptoms, such as increased coughing, weight loss, fevers, or chills. -Use the flutter valve regularly along with Mucinex  to help clear mucus from your lungs.  CHRONIC COUGH: You have a persistent cough that is worse at night. Currently, it is not severe enough to need additional treatment. -No new treatment is needed at this time. Continue to monitor your symptoms.                                Contains text generated by [employer].

## 2024-06-15 NOTE — Progress Notes (Signed)
 Megan Porter    [id number]    1941-01-16  Primary Care Physician:Tisovec, Charlie ORN, MD  Referring Physician: Tisovec, Richard W, MD [address] [place],  [place] [address]  Chief complaint:   Follow-up for bronchiectasis, pneumonia   HPI: Megan Porter is a 83 year old with rheumatoid arthritis, cancer.She was hospitalized in May 2018 at [facility] for respiratory failure with multilobar infiltrates on CT scan. Flu test, Legionella urine test was negative.She was treated with a prednisone  taper and Levaquin. She apparently had drug reaction with hives to Levaquin and it was changed to a different antibiotics. Since her return to [facility] she's had a follow-up CT scan earlier this month which showed resolution of the infiltrates.There is some mild bronchiectasis with mucus plugging in the right middle lobe. She has been referred to pulmonary for further evaluation.  Hospitalized in early January 2020 for acute hypoxic respiratory failure secondary to pneumonia with chest x-ray showing right upper lobe infiltrate. Work-up including urine pneumococcus, respiratory virus panel and blood cultures were negative.   She has history of rheumatoid arthritis and is followed by Dr. Dolphus. She was being maintained on methotrexate  and Plaquenil .  Since her hospitalization she was taken off methotrexate  in 2020 and continues on Plaquenil   Underwent bronchoscopy in October 2020 for evaluation of bronchiectasis.  BAL cultures are negative.  Interim history: Discussed the use of AI scribe software for clinical note transcription with the patient, who gave verbal consent to proceed.  History of Present Illness LAYZA Porter is an 83 year old female with chronic lung inflammation who presents for follow-up of her condition.  Chronic cough and pulmonary symptoms - Persistent cough, worse at night - Occasional mucus production, often swallowed - No significant weight loss, fever, or  chills - CT scan earlier this year showed slight worsening of lung inflammation - Bronchoscopy in 2020 negative for mycobacteria  Inhaler and airway clearance device use - Not using Symbicort  inhaler due to perceived lack of efficacy - Possesses a flutter valve but does not use it regularly  Immunomodulatory therapy - Currently taking Plaquenil  for rheumatoid arthritis - Methotrexate  discontinued in 2020   Relevant pulmonary history Pets:No pets. She used to have a dog. She does not have any birds, exotic pets or exposure to farm animals Occupation:Homemaker Exposures:No known exposures. No mold issues at home. Smoking history:Never smoker  Outpatient Encounter Medications as of 06/15/2024  Medication Sig   Biotin 5 MG CAPS Take 5 mg by mouth daily.   calcium -vitamin D  (OSCAL WITH D) 500-200 MG-UNIT TABS tablet Take 1 tablet by mouth daily.   Cholecalciferol  (VITAMIN D ) 50 MCG (2000 UT) CAPS Take 2,000 Units by mouth daily.    diphenhydrAMINE-APAP, sleep, (TYLENOL  PM EXTRA STRENGTH PO) Take 1-2 tablets by mouth daily as needed (pain).   hydrochlorothiazide (HYDRODIURIL) 12.5 MG tablet 1 tablet in the morning Orally Once a day   hydroxychloroquine  (PLAQUENIL ) 200 MG tablet Take 1 tablet (200 mg total) by mouth daily.   levothyroxine  (SYNTHROID ) 75 MCG tablet Take 75 mcg by mouth daily.   losartan (COZAAR) 100 MG tablet 1 tablet Orally Once a day   Multiple Vitamin (MULTIVITAMIN) capsule Take 1 capsule by mouth daily.   SODIUM FLUORIDE, DENTAL RINSE, (PREVIDENT) 0.2 % SOLN Take 1 Application by mouth daily as needed.   WELLBUTRIN XL 150 MG 24 hr tablet 1 tablet in the morning Orally Once a day; Duration: 30 days   citalopram  (CELEXA ) 40  MG tablet TAKE 1 TABLET BY MOUTH  DAILY (Patient not taking: Reported on 06/15/2024)   mupirocin ointment (BACTROBAN) 2 % Apply 1 Application topically 2 (two) times daily. (Patient not taking: Reported on 06/15/2024)   No facility-administered encounter  medications on file as of 06/15/2024.   Vitals:   06/15/24 1517  BP: 122/78  Pulse: 78  Temp: 98 F (36.7 C)  Height: 5' 3 (1.6 m)  Weight: 119 lb (54 kg)  SpO2: 96%  TempSrc: Oral  BMI (Calculated): 21.09     Physical Exam GEN: No acute distress CV: Regular rate and rhythm no murmurs LUNGS: Clear to auscultation bilaterally normal respiratory effort SKIN JOINTS: Warm and dry no rash    Data Reviewed: Imaging: DATA from [facility] Chest x-ray 12/16/16- left upper lobe consolidation, right mid lung consolidation CT chest 12/18/16- Small to moderate bilateral pleural effusion, dense alveolar infiltrate throughout the left upper lobe, right middle lobe and left lower lobe with underlying nodularity. Mediastinal lymphadenopathy.  CT scan 04/23/17-  Mild bronchiectasis and mucous plugging in the right middle lobe, lingula. No lymphadenopathy.  Chest x-ray 09/21/2018- right upper lobe airspace opacity. Chest x-ray 09/25/2018- progressive right upper lobe and left upper lobe infiltrates. High-resolution CT chest 02/25/2019- nodularity, consolidation and bronchiectasis in the lingula which is worsened since 2018.  New nodularity in the left lower lobe.  Bronchial wall thickening and scattered bronchial plugging throughout the lungs.  No evidence of pulmonary fibrosis  High resolution CT 12/10/2022-patchy areas of bronchial thickening, mild bronchiectasis, clustered nodularity  High resolution CT 03/24/2024-progressive multifocal inflammatory process. I have reviewed the images personally.  PFTs  08/06/17 FVC 1.82 [73%], FEV1 1.32 [71%], F/F 73, TLC 67%, DLCO 70% Mild obstruction, moderate restriction, mild diffusion defect  02/28/2023 FVC 1.38 [62%], FEV1 1.10 [68%], F/F80, TLC 4.07 [85%], DLCO 13.79 [79%] Mild obstruction  Labs: BAL 07/06/2019 Cell count-nucleated cells 990, 98% neutrophils 0% eos Cell count nucleated cells 36, 80% monocyte macrophage, 0% eos AFB, fungus,  cultures-negative  Assessment & Plan Suspected chronic pulmonary mycobacterial infection Chronic pulmonary mycobacterial infection is suspected based on CT scan findings showing slight worsening inflammation as of July 2025. Previous bronchoscopy in 2020 was negative, but CT changes remain suspicious for chronic infection. The condition is typically not dangerous and often has a waxing and waning course. - Monitor for symptom worsening, including increased coughing, weight loss, fevers, or chills. - Encourage use of the flutter valve with Mucinex  to aid in secretion clearance.  Chronic cough Chronic cough persists, primarily occurring at night. Symptoms are not severe enough to require intervention at this time.  Rheumatoid Arthritis Managed by Dr. Dolphus. Currently on Plaquenil , with methotrexate  discontinued in 2020. -Continue current management with Dr. Dolphus and Plaquenil .   Plan/Recommendations: Follow-up in 6 months.  Habeeb Puertas MD [facility] 06/15/2024, 3:23 PM  CC: Tisovec, Charlie ORN, MD

## 2024-06-23 ENCOUNTER — Other Ambulatory Visit: Payer: Self-pay | Admitting: Rheumatology

## 2024-06-23 DIAGNOSIS — M0609 Rheumatoid arthritis without rheumatoid factor, multiple sites: Secondary | ICD-10-CM

## 2024-06-23 DIAGNOSIS — Z79899 Other long term (current) drug therapy: Secondary | ICD-10-CM

## 2024-06-23 NOTE — Telephone Encounter (Signed)
 Last Fill: 02/12/2024  Eye exam: 03/25/2024   Labs: 05/05/2024 CBC and CMP stable, glucose is elevated, probably not a fasting sample, total cholesterol is elevated.   Next Visit: 10/07/2024  Last Visit: 05/05/2024  IK:Myzlfjunpi arthritis, seronegative, multiple sites   Current Dose per office note on 05/05/2024: Plaquenil  200 mg 1 tablet by mouth daily   Okay to refill Plaquenil ?

## 2024-06-29 ENCOUNTER — Other Ambulatory Visit (HOSPITAL_COMMUNITY): Payer: Self-pay | Admitting: Internal Medicine

## 2024-06-29 ENCOUNTER — Telehealth (HOSPITAL_COMMUNITY): Payer: Self-pay | Admitting: Pharmacy Technician

## 2024-06-29 NOTE — Telephone Encounter (Addendum)
 Auth Submission: APPROVED Site of care: [facility] Payer: [employer] MEDICARE Medication & CPT/J Code(s) submitted: Prolia  (Denosumab ) [id number] Diagnosis Code: M81.0 Route of submission (phone, fax, portal): portal Phone # Fax # Auth type: Buy/Bill HB Units/visits requested: 60mg  x 2 doses, q 6 months Reference number: [id number] Approval from: 06/29/2024 to 06/29/25      Dagoberto Armour, CPhT [facility] Phone: [phone] 06/29/2024

## 2024-08-24 ENCOUNTER — Inpatient Hospital Stay (HOSPITAL_COMMUNITY)
Admission: RE | Admit: 2024-08-24 | Discharge: 2024-08-24 | Disposition: A | Source: Ambulatory Visit | Attending: Internal Medicine

## 2024-08-24 VITALS — BP 146/66 | HR 85 | Temp 97.0°F | Resp 17

## 2024-08-24 DIAGNOSIS — M810 Age-related osteoporosis without current pathological fracture: Secondary | ICD-10-CM

## 2024-08-24 MED ORDER — DENOSUMAB 60 MG/ML ~~LOC~~ SOSY
60.0000 mg | PREFILLED_SYRINGE | Freq: Once | SUBCUTANEOUS | Status: AC
  Administered 2024-08-24: 60 mg via SUBCUTANEOUS

## 2024-08-24 MED ORDER — DENOSUMAB 60 MG/ML ~~LOC~~ SOSY
PREFILLED_SYRINGE | SUBCUTANEOUS | Status: AC
  Filled 2024-08-24: qty 1

## 2024-08-26 ENCOUNTER — Other Ambulatory Visit: Payer: Self-pay | Admitting: Rheumatology

## 2024-08-26 DIAGNOSIS — M0609 Rheumatoid arthritis without rheumatoid factor, multiple sites: Secondary | ICD-10-CM

## 2024-08-26 DIAGNOSIS — Z79899 Other long term (current) drug therapy: Secondary | ICD-10-CM

## 2024-08-26 NOTE — Telephone Encounter (Signed)
 Last Fill: 06/23/2024  Eye exam: 03/25/2024   Labs: 05/05/2024 CBC and CMP stable, glucose is elevated, probably not a fasting sample, total cholesterol is elevated.   Next Visit: 10/07/2024  Last Visit: 05/05/2024  IK:Myzlfjunpi arthritis, seronegative, multiple sites   Current Dose per office note on 05/05/2024: Plaquenil  200 mg 1 tablet by mouth daily   Okay to refill Plaquenil ?

## 2024-09-23 NOTE — Progress Notes (Unsigned)
 "  Office Visit Note  Patient: Megan Porter             Date of Birth: 12/05/1940           MRN: [id number]             PCP: Tisovec, Richard W, MD Referring: Vernadine Charlie ORN, MD Visit Date: 10/07/2024 Occupation: Data Unavailable  Subjective:  Medication monitoring  History of Present Illness: Megan Porter is a 84 y.o. female with history of seronegative rheumatoid arthritis.  Patient remains on plaquenil  200 mg 1 tablet by mouth daily.  She is tolerating Plaquenil  without any side effects and has not had any gaps in therapy.  She denies any signs or symptoms of a rheumatoid arthritis flare.  She has not noticed any joint swelling.  Patient states that she has noticed increased muscle fatigue in bilateral lower extremities but she is walking for long distances.  She denies any falls.  She denies any lower back pain. She denies any new medical conditions.  She denies any recent or recurrent infections. She has moved to [place] with her husband.     Activities of Daily Living:  Patient reports morning stiffness for 1 hour.   Patient Denies nocturnal pain.  Difficulty dressing/grooming: Denies Difficulty climbing stairs: Reports Difficulty getting out of chair: Denies Difficulty using hands for taps, buttons, cutlery, and/or writing: Reports  Review of Systems  Constitutional:  Positive for fatigue.  HENT:  Positive for mouth dryness. Negative for mouth sores.   Eyes:  Negative for dryness.  Respiratory:  Positive for shortness of breath.   Cardiovascular:  Negative for chest pain and palpitations.  Gastrointestinal:  Positive for constipation. Negative for blood in stool and diarrhea.  Endocrine: Negative for increased urination.  Genitourinary:  Positive for involuntary urination.  Musculoskeletal:  Positive for joint pain, joint pain and morning stiffness. Negative for gait problem, joint swelling, myalgias, muscle weakness, muscle tenderness and myalgias.  Skin:   Positive for hair loss. Negative for color change, rash and sensitivity to sunlight.  Allergic/Immunologic: Negative for susceptible to infections.  Neurological:  Negative for dizziness and headaches.  Hematological:  Negative for swollen glands.  Psychiatric/Behavioral:  Negative for depressed mood and sleep disturbance. The patient is not nervous/anxious.     PMFS History:  Patient Active Problem List   Diagnosis Date Noted   Abnormal CT scan of lung    Hypokalemia 09/21/2018   Benign essential HTN 09/21/2018   Leucocytosis 09/21/2018   Pneumonia 09/21/2018   Bronchiectasis without complication (HCC) 05/21/2017   Primary osteoarthritis of both knees 04/06/2017   DDD (degenerative disc disease), lumbar history of lumbar surgery 1970 04/06/2017   Age-related osteoporosis without current pathological fracture 04/06/2017   History of anxiety 04/06/2017   History of breast cancer 04/04/2017   Primary osteoarthritis of both feet 10/02/2016   Primary osteoarthritis of both hands 10/02/2016   High risk medications (not anticoagulants) long-term use 10/02/2016   Rheumatoid arthritis, seronegative, multiple sites (HCC) 10/02/2016    Past Medical History:  Diagnosis Date   Arthritis    Cancer (HCC)    Hypertension    Osteoarthritis     Family History  Family history unknown: Yes   Past Surgical History:  Procedure Laterality Date   BACK SURGERY     BREAST RECONSTRUCTION     knee arthroscopic     VIDEO BRONCHOSCOPY Bilateral 07/06/2019   Procedure: VIDEO BRONCHOSCOPY WITHOUT FLUORO;  Surgeon: Mannam, Praveen, MD;  Location: [facility];  Service: Cardiopulmonary;  Laterality: Bilateral;   Social History[1] Social History   Social History Narrative   Not on file     Immunization History  Administered Date(s) Administered   Fluad Quad(high Dose 65+) 05/29/2019   Influenza-Unspecified 07/06/2017, 07/24/2018   PFIZER(Purple Top)SARS-COV-2 Vaccination 10/07/2019,  10/28/2019, 06/28/2020, 06/05/2021   Pneumococcal Conjugate-13 04/13/2014   Pneumococcal Polysaccharide-23 10/13/2018   Td (Adult),5 Lf Tetanus Toxid, Preservative Free 08/19/2021     Objective: Vital Signs: BP 139/76   Pulse 83   Temp 97.8 F (36.6 C)   Resp 13   Ht 5' 2 (1.575 m)   Wt 118 lb 12.8 oz (53.9 kg)   BMI 21.73 kg/m    Physical Exam Vitals and nursing note reviewed.  Constitutional:      Appearance: She is well-developed.  HENT:     Head: Normocephalic and atraumatic.  Eyes:     Conjunctiva/sclera: Conjunctivae normal.  Cardiovascular:     Rate and Rhythm: Normal rate and regular rhythm.     Heart sounds: Normal heart sounds.  Pulmonary:     Effort: Pulmonary effort is normal.     Breath sounds: Normal breath sounds.  Abdominal:     General: Bowel sounds are normal.     Palpations: Abdomen is soft.  Musculoskeletal:     Cervical back: Normal range of motion.  Lymphadenopathy:     Cervical: No cervical adenopathy.  Skin:    General: Skin is warm and dry.     Capillary Refill: Capillary refill takes less than 2 seconds.  Neurological:     Mental Status: She is alert and oriented to person, place, and time.  Psychiatric:        Behavior: Behavior normal.      Musculoskeletal Exam: C-spine has limited range of motion.  Thoracic kyphosis noted.  No midline spinal tenderness.  Shoulder joints, elbow joints, wrist joints, MCPs, PIPs, DIPs have good range of motion with no synovitis.  CMC, PIP, DIP thickening consistent with osteoarthritis of both hands.  Complete fist formation bilaterally.  Hip joints have limited range of motion with no groin pain.  Knee joints have good range of motion no warmth or effusion.  Ankle joints have good range of motion no tenderness or joint swelling.  No evidence of Achilles tendinitis or plantar fasciitis.   CDAI Exam: CDAI Score: -- Patient Global: --; Provider Global: -- Swollen: --; Tender: -- Joint Exam 10/07/2024    No joint exam has been documented for this visit   There is currently no information documented on the homunculus. Go to the Rheumatology activity and complete the homunculus joint exam.  Investigation: No additional findings.  Imaging: No results found.  Recent Labs: Lab Results  Component Value Date   WBC 8.6 05/05/2024   HGB 12.4 05/05/2024   PLT 236 05/05/2024   NA 138 05/05/2024   K 3.9 05/05/2024   CL 100 05/05/2024   CO2 31 05/05/2024   GLUCOSE 140 (H) 05/05/2024   BUN 19 05/05/2024   CREATININE 0.96 (H) 05/05/2024   BILITOT 0.2 05/05/2024   ALKPHOS 70 09/25/2018   AST 14 05/05/2024   ALT 14 05/05/2024   PROT 6.5 05/05/2024   ALBUMIN 1.8 (L) 09/25/2018   CALCIUM  8.9 05/05/2024   GFRAA 78 01/04/2021    Speciality Comments: PLQ Eye Exam 03/25/2024 WNL  @ [facility] Follow up in 1 year  Procedures:  No procedures performed Allergies: Patient has no known allergies.  Assessment / Plan:     Visit Diagnoses: Rheumatoid arthritis, seronegative, multiple sites (HCC) - Erosive disease, neg RF,CCP, and ANA: She has no synovitis on examination today.  She has not had any signs or symptoms of a rheumatoid arthritis flare.  She is clinically been doing well taking Plaquenil  200 mg 1 tablet by mouth daily.  She is tolerating Plaquenil  without any side effects and has not had any gaps in therapy.  No medication changes will be made at this time.  She was advised to notify us  if she develops any signs or symptoms of a flare.  She will follow-up in the office in 5 months or sooner if needed.  High risk medications (not anticoagulants) long-term use - Plaquenil  200 mg 1 tablet by mouth daily. D/c MTX in April 2020 due to recurrent infections.  CBC and CMP updated on 05/05/24.  Orders for CBC and CMP released today.   Lipid panel updated on 05/05/24.  PLQ Eye Exam 03/25/2024 WNL @ [facility] Follow up in 1 year   - Plan: CBC with Differential/Platelet,  Comprehensive metabolic panel with GFR  Elevated sed rate: ESR was 14 on 09/24/2023.  No synovitis noted.   Primary osteoarthritis of both hands: CMC, PIP, DIP thickening consistent with osteoarthritis of both hands.  No synovitis noted.  S/P left knee arthroscopy - Performed by Dr. Melodi.  No warmth or effusion noted.  Primary osteoarthritis of both hips: Slightly limited range of motion.  No groin pain currently.  Primary osteoarthritis of both knees: She has good range of motion of both knee joints on examination today.  No warmth or effusion noted.  Primary osteoarthritis of both feet: She has good range of motion of both ankle joints with no tenderness or joint swelling.  She is not experiencing any increased discomfort in her feet at this time.  She is wearing proper fitting shoes.  Degeneration of intervertebral disc of lumbar region without discogenic back pain or lower extremity pain: She is not experiencing any increased discomfort in the lower back at this time.  She has no midline spinal tenderness.  No symptoms of radiculopathy.  Age-related osteoporosis without current pathological fracture: DEXA followed by PCP.  Patient remains on Prolia  60 mg sq injections once every 6 months.  Vitamin D  deficiency: She is taking vitamin D  2000 units daily.  She is also taking a calcium  supplement daily.  Other closed fracture of distal end of right radius with routine healing, subsequent encounter  Poor balance: Patient has noticed increased muscle fatigue and lower extremity deconditioning.  Plan to check CK today.  Offered referral to physical therapy for fall prevention and lower extremity strengthening.  Muscular deconditioning: Offered to place a physical therapy referral.  Discussed the importance of lower extremity muscle strengthening and fall prevention.  Muscle fatigue -She has been experiencing increased muscle fatigue involving bilateral lower extremities.  No recent falls.   Discussed the importance of lower extremity muscle strengthening.  Offered referral to physical therapy.  Plan to check CK today.  Plan: CK  Other medical conditions are listed as follows:  History of anxiety  Bronchiectasis without complication (HCC)  History of breast cancer  Dyslipidemia  Other fatigue  Orders: Orders Placed This Encounter  Procedures   CBC with Differential/Platelet   Comprehensive metabolic panel with GFR   CK   No orders of the defined types were placed in this encounter.     Follow-Up Instructions: Return in about 5 months (around  03/07/2025) for Rheumatoid arthritis.   Waddell CHRISTELLA Craze, PA-C  Note - This record has been created using Dragon software.  Chart creation errors have been sought, but may not always  have been located. Such creation errors do not reflect on  the standard of medical care.     [1]  Social History Tobacco Use   Smoking status: Never    Passive exposure: Never   Smokeless tobacco: Never  Vaping Use   Vaping status: Never Used  Substance Use Topics   Alcohol use: Yes    Alcohol/week: 7.0 standard drinks of alcohol    Types: 7 Glasses of wine per week   Drug use: No   "

## 2024-10-07 ENCOUNTER — Ambulatory Visit: Attending: Physician Assistant | Admitting: Physician Assistant

## 2024-10-07 ENCOUNTER — Encounter: Payer: Self-pay | Admitting: Physician Assistant

## 2024-10-07 VITALS — BP 139/76 | HR 83 | Temp 97.8°F | Resp 13 | Ht 62.0 in | Wt 118.8 lb

## 2024-10-07 DIAGNOSIS — M19072 Primary osteoarthritis, left ankle and foot: Secondary | ICD-10-CM

## 2024-10-07 DIAGNOSIS — Z8659 Personal history of other mental and behavioral disorders: Secondary | ICD-10-CM

## 2024-10-07 DIAGNOSIS — M19041 Primary osteoarthritis, right hand: Secondary | ICD-10-CM

## 2024-10-07 DIAGNOSIS — E785 Hyperlipidemia, unspecified: Secondary | ICD-10-CM

## 2024-10-07 DIAGNOSIS — M51369 Other intervertebral disc degeneration, lumbar region without mention of lumbar back pain or lower extremity pain: Secondary | ICD-10-CM

## 2024-10-07 DIAGNOSIS — M160 Bilateral primary osteoarthritis of hip: Secondary | ICD-10-CM

## 2024-10-07 DIAGNOSIS — M19071 Primary osteoarthritis, right ankle and foot: Secondary | ICD-10-CM | POA: Diagnosis not present

## 2024-10-07 DIAGNOSIS — Z79899 Other long term (current) drug therapy: Secondary | ICD-10-CM | POA: Diagnosis not present

## 2024-10-07 DIAGNOSIS — R700 Elevated erythrocyte sedimentation rate: Secondary | ICD-10-CM | POA: Diagnosis not present

## 2024-10-07 DIAGNOSIS — Z9889 Other specified postprocedural states: Secondary | ICD-10-CM

## 2024-10-07 DIAGNOSIS — R5383 Other fatigue: Secondary | ICD-10-CM

## 2024-10-07 DIAGNOSIS — M810 Age-related osteoporosis without current pathological fracture: Secondary | ICD-10-CM | POA: Diagnosis not present

## 2024-10-07 DIAGNOSIS — J479 Bronchiectasis, uncomplicated: Secondary | ICD-10-CM

## 2024-10-07 DIAGNOSIS — M19042 Primary osteoarthritis, left hand: Secondary | ICD-10-CM

## 2024-10-07 DIAGNOSIS — R29898 Other symptoms and signs involving the musculoskeletal system: Secondary | ICD-10-CM

## 2024-10-07 DIAGNOSIS — E559 Vitamin D deficiency, unspecified: Secondary | ICD-10-CM

## 2024-10-07 DIAGNOSIS — S52591D Other fractures of lower end of right radius, subsequent encounter for closed fracture with routine healing: Secondary | ICD-10-CM

## 2024-10-07 DIAGNOSIS — Z853 Personal history of malignant neoplasm of breast: Secondary | ICD-10-CM

## 2024-10-07 DIAGNOSIS — M6289 Other specified disorders of muscle: Secondary | ICD-10-CM

## 2024-10-07 DIAGNOSIS — R2689 Other abnormalities of gait and mobility: Secondary | ICD-10-CM

## 2024-10-07 DIAGNOSIS — M0609 Rheumatoid arthritis without rheumatoid factor, multiple sites: Secondary | ICD-10-CM

## 2024-10-07 DIAGNOSIS — M170 Bilateral primary osteoarthritis of knee: Secondary | ICD-10-CM

## 2024-10-08 ENCOUNTER — Ambulatory Visit: Payer: Self-pay | Admitting: Physician Assistant

## 2024-10-08 LAB — COMPREHENSIVE METABOLIC PANEL WITH GFR
AG Ratio: 1.5 (calc) (ref 1.0–2.5)
ALT: 11 U/L (ref 6–29)
AST: 15 U/L (ref 10–35)
Albumin: 3.9 g/dL (ref 3.6–5.1)
Alkaline phosphatase (APISO): 79 U/L (ref 37–153)
BUN: 15 mg/dL (ref 7–25)
CO2: 31 mmol/L (ref 20–32)
Calcium: 9 mg/dL (ref 8.6–10.4)
Chloride: 104 mmol/L (ref 98–110)
Creat: 0.81 mg/dL (ref 0.60–0.95)
Globulin: 2.6 g/dL (ref 1.9–3.7)
Glucose, Bld: 119 mg/dL — ABNORMAL HIGH (ref 65–99)
Potassium: 4.1 mmol/L (ref 3.5–5.3)
Sodium: 142 mmol/L (ref 135–146)
Total Bilirubin: 0.3 mg/dL (ref 0.2–1.2)
Total Protein: 6.5 g/dL (ref 6.1–8.1)
eGFR: 71 mL/min/1.73m2

## 2024-10-08 LAB — CBC WITH DIFFERENTIAL/PLATELET
Absolute Lymphocytes: 815 {cells}/uL — ABNORMAL LOW (ref 850–3900)
Absolute Monocytes: 546 {cells}/uL (ref 200–950)
Basophils Absolute: 59 {cells}/uL (ref 0–200)
Basophils Relative: 0.7 %
Eosinophils Absolute: 202 {cells}/uL (ref 15–500)
Eosinophils Relative: 2.4 %
HCT: 36.4 % (ref 35.9–46.0)
Hemoglobin: 12.1 g/dL (ref 11.7–15.5)
MCH: 32.2 pg (ref 27.0–33.0)
MCHC: 33.2 g/dL (ref 31.6–35.4)
MCV: 96.8 fL (ref 81.4–101.7)
MPV: 9.9 fL (ref 7.5–12.5)
Monocytes Relative: 6.5 %
Neutro Abs: 6779 {cells}/uL (ref 1500–7800)
Neutrophils Relative %: 80.7 %
Platelets: 242 Thousand/uL (ref 140–400)
RBC: 3.76 Million/uL — ABNORMAL LOW (ref 3.80–5.10)
RDW: 12 % (ref 11.0–15.0)
Total Lymphocyte: 9.7 %
WBC: 8.4 Thousand/uL (ref 3.8–10.8)

## 2024-10-08 LAB — CK: Total CK: 86 U/L (ref 16–215)

## 2024-10-08 NOTE — Progress Notes (Signed)
 CK WNL-great news!  Glucose is 119, rest of CMP WNL  RBC count remains borderline low but is stable.  Absolute lymphocytes are slightly low-we will continue to monitor. Rest of CBC WNL.

## 2025-02-23 ENCOUNTER — Encounter (HOSPITAL_COMMUNITY)

## 2025-03-11 ENCOUNTER — Ambulatory Visit: Admitting: Rheumatology
